# Patient Record
Sex: Male | Born: 1940 | Race: White | Hispanic: No | Marital: Married | Smoking: Never smoker
Health system: Southern US, Community
[De-identification: ages and names within clinical notes are randomized; demographics above are authoritative.]

## PROBLEM LIST (undated history)

## (undated) DIAGNOSIS — I2510 Atherosclerotic heart disease of native coronary artery without angina pectoris: Secondary | ICD-10-CM

## (undated) DIAGNOSIS — I252 Old myocardial infarction: Secondary | ICD-10-CM

## (undated) DIAGNOSIS — I639 Cerebral infarction, unspecified: Secondary | ICD-10-CM

## (undated) DIAGNOSIS — C61 Malignant neoplasm of prostate: Secondary | ICD-10-CM

## (undated) DIAGNOSIS — N400 Enlarged prostate without lower urinary tract symptoms: Secondary | ICD-10-CM

## (undated) DIAGNOSIS — E785 Hyperlipidemia, unspecified: Secondary | ICD-10-CM

## (undated) DIAGNOSIS — Z973 Presence of spectacles and contact lenses: Secondary | ICD-10-CM

## (undated) DIAGNOSIS — R351 Nocturia: Secondary | ICD-10-CM

## (undated) DIAGNOSIS — Z955 Presence of coronary angioplasty implant and graft: Secondary | ICD-10-CM

## (undated) DIAGNOSIS — I255 Ischemic cardiomyopathy: Secondary | ICD-10-CM

## (undated) DIAGNOSIS — R001 Bradycardia, unspecified: Secondary | ICD-10-CM

## (undated) DIAGNOSIS — Q211 Atrial septal defect: Secondary | ICD-10-CM

## (undated) DIAGNOSIS — Q2112 Patent foramen ovale: Secondary | ICD-10-CM

## (undated) HISTORY — DX: Cerebral infarction, unspecified: I63.9

## (undated) HISTORY — DX: Ischemic cardiomyopathy: I25.5

## (undated) HISTORY — PX: PROSTATE BIOPSY: SHX241

## (undated) HISTORY — DX: Atrial septal defect: Q21.1

## (undated) HISTORY — DX: Patent foramen ovale: Q21.12

---

## 1998-11-18 HISTORY — PX: ANKLE CLOSED REDUCTION: SHX880

## 2012-05-30 ENCOUNTER — Encounter (HOSPITAL_COMMUNITY): Payer: Self-pay | Admitting: Emergency Medicine

## 2012-05-30 ENCOUNTER — Inpatient Hospital Stay (HOSPITAL_COMMUNITY)
Admission: EM | Admit: 2012-05-30 | Discharge: 2012-06-01 | DRG: 246 | Disposition: A | Discharge status: Home / Self Care | Payer: PRIVATE HEALTH INSURANCE | Attending: Cardiology | Admitting: Cardiology

## 2012-05-30 ENCOUNTER — Emergency Department (HOSPITAL_COMMUNITY): Payer: PRIVATE HEALTH INSURANCE

## 2012-05-30 DIAGNOSIS — I5043 Acute on chronic combined systolic (congestive) and diastolic (congestive) heart failure: Secondary | ICD-10-CM | POA: Diagnosis present

## 2012-05-30 DIAGNOSIS — Z7902 Long term (current) use of antithrombotics/antiplatelets: Secondary | ICD-10-CM

## 2012-05-30 DIAGNOSIS — I509 Heart failure, unspecified: Secondary | ICD-10-CM | POA: Diagnosis present

## 2012-05-30 DIAGNOSIS — I498 Other specified cardiac arrhythmias: Secondary | ICD-10-CM | POA: Diagnosis present

## 2012-05-30 DIAGNOSIS — Z7982 Long term (current) use of aspirin: Secondary | ICD-10-CM

## 2012-05-30 DIAGNOSIS — I214 Non-ST elevation (NSTEMI) myocardial infarction: Secondary | ICD-10-CM

## 2012-05-30 DIAGNOSIS — E785 Hyperlipidemia, unspecified: Secondary | ICD-10-CM | POA: Diagnosis present

## 2012-05-30 DIAGNOSIS — Z79899 Other long term (current) drug therapy: Secondary | ICD-10-CM

## 2012-05-30 DIAGNOSIS — I2510 Atherosclerotic heart disease of native coronary artery without angina pectoris: Secondary | ICD-10-CM | POA: Diagnosis present

## 2012-05-30 DIAGNOSIS — D72829 Elevated white blood cell count, unspecified: Secondary | ICD-10-CM | POA: Diagnosis present

## 2012-05-30 DIAGNOSIS — Z955 Presence of coronary angioplasty implant and graft: Secondary | ICD-10-CM

## 2012-05-30 HISTORY — DX: Hyperlipidemia, unspecified: E78.5

## 2012-05-30 HISTORY — DX: Atherosclerotic heart disease of native coronary artery without angina pectoris: I25.10

## 2012-05-30 LAB — COMPREHENSIVE METABOLIC PANEL
Albumin: 4 g/dL (ref 3.5–5.2)
BUN: 18 mg/dL (ref 6–23)
Chloride: 107 mEq/L (ref 96–112)
Creatinine, Ser: 1.06 mg/dL (ref 0.50–1.35)
GFR calc non Af Amer: 69 mL/min — ABNORMAL LOW (ref >90)
Total Bilirubin: 0.4 mg/dL (ref 0.3–1.2)

## 2012-05-30 LAB — CBC WITH DIFFERENTIAL/PLATELET
Basophils Relative: 0 % (ref 0–1)
Eosinophils Relative: 1 % (ref 0–5)
HCT: 42.3 % (ref 39.0–52.0)
Hemoglobin: 14.9 g/dL (ref 13.0–17.0)
MCH: 31 pg (ref 26.0–34.0)
MCHC: 35.2 g/dL (ref 30.0–36.0)
MCV: 87.9 fL (ref 78.0–100.0)
Monocytes Absolute: 0.9 10*3/uL (ref 0.1–1.0)
Monocytes Relative: 7 % (ref 3–12)
Neutro Abs: 9.8 10*3/uL — ABNORMAL HIGH (ref 1.7–7.7)

## 2012-05-30 LAB — POCT I-STAT TROPONIN I: Troponin i, poc: 0.27 ng/mL (ref 0.00–0.08)

## 2012-05-30 MED ORDER — NITROGLYCERIN IN D5W 200-5 MCG/ML-% IV SOLN
5.0000 ug/min | Freq: Once | INTRAVENOUS | Status: AC
  Administered 2012-05-30: 10 ug via INTRAVENOUS

## 2012-05-30 MED ORDER — HEPARIN SODIUM (PORCINE) 1000 UNIT/ML IJ SOLN
4000.0000 [IU] | Freq: Once | INTRAMUSCULAR | Status: AC
  Administered 2012-05-30: 4000 [IU] via INTRAVENOUS
  Filled 2012-05-30: qty 4

## 2012-05-30 MED ORDER — HEPARIN (PORCINE) IN NACL 100-0.45 UNIT/ML-% IJ SOLN
1000.0000 [IU]/h | Freq: Once | INTRAMUSCULAR | Status: AC
  Administered 2012-05-30: 1000 [IU]/h via INTRAVENOUS
  Filled 2012-05-30: qty 250

## 2012-05-30 MED ORDER — NITROGLYCERIN IN D5W 200-5 MCG/ML-% IV SOLN
INTRAVENOUS | Status: AC
  Administered 2012-05-30: 10 ug via INTRAVENOUS
  Filled 2012-05-30: qty 250

## 2012-05-30 MED ORDER — ASPIRIN 81 MG PO CHEW
324.0000 mg | CHEWABLE_TABLET | Freq: Once | ORAL | Status: AC
  Administered 2012-05-30: 324 mg via ORAL
  Filled 2012-05-30: qty 4

## 2012-05-30 MED ORDER — NITROGLYCERIN 0.4 MG SL SUBL
0.4000 mg | SUBLINGUAL_TABLET | SUBLINGUAL | Status: AC | PRN
  Administered 2012-05-31 (×3): 0.4 mg via SUBLINGUAL
  Filled 2012-05-30: qty 75

## 2012-05-30 NOTE — ED Provider Notes (Signed)
History     CSN: [id number]  Arrival date & time 05/30/12  2243   First MD Initiated Contact with Patient 05/30/12 2308      Chief Complaint  Patient presents with  . Chest Pain    (Consider location/radiation/quality/duration/timing/severity/associated sxs/prior treatment) HPI Comments: 71 year old male with no medical history presents with a complaint of substernal chest pain which feels like a pressure or heaviness, constant, started 6 hours ago while he was painting his house and is associated with some shortness of breath. He denies nausea vomiting or diaphoresis. He relates a similar event 2 weeks ago which lasted for approximately one hour and released after aspirin and Pepcid. He denies any cardiac history, no family history of heart disease, no tobacco use, no hypertension diabetes or hypercholesterolemia. He denies swelling in his legs, recent travel, trauma, immobilization. He has had aspirin daily but no other medications prior to arrival.  Patient is a 71 y.o. male presenting with chest pain. The history is provided by the patient and the spouse.  Chest Pain     History reviewed. No pertinent past medical history.  History reviewed. No pertinent past surgical history.  History reviewed. No pertinent family history.  History  Substance Use Topics  . Smoking status: Never Smoker   . Smokeless tobacco: Not on file  . Alcohol Use: No      Review of Systems  Cardiovascular: Positive for chest pain.  All other systems reviewed and are negative.    Allergies  Vicodin  Home Medications   Current Outpatient Rx  Name Route Sig Dispense Refill  . ADULT MULTIVITAMIN W/MINERALS CH Oral Take 1 tablet by mouth daily.    . OMEGA-3-ACID ETHYL ESTERS 1 G PO CAPS Oral Take 2 g by mouth 2 (two) times daily.      BP 155/94  Pulse 53  Temp 98.1 F (36.7 C) (Oral)  Resp 18  SpO2 99%  Physical Exam  Nursing note and vitals reviewed. Constitutional: He appears  well-developed and well-nourished. No distress.  HENT:  Head: Normocephalic and atraumatic.  Mouth/Throat: Oropharynx is clear and moist. No oropharyngeal exudate.  Eyes: Conjunctivae and EOM are normal. Pupils are equal, round, and reactive to light. Right eye exhibits no discharge. Left eye exhibits no discharge. No scleral icterus.  Neck: Normal range of motion. Neck supple. No JVD present. No thyromegaly present.  Cardiovascular: Normal rate, regular rhythm, normal heart sounds and intact distal pulses.  Exam reveals no gallop and no friction rub.   No murmur heard. Pulmonary/Chest: Effort normal and breath sounds normal. No respiratory distress. He has no wheezes. He has no rales.  Abdominal: Soft. Bowel sounds are normal. He exhibits no distension and no mass. There is no tenderness.  Musculoskeletal: Normal range of motion. He exhibits no edema and no tenderness.  Lymphadenopathy:    He has no cervical adenopathy.  Neurological: He is alert. Coordination normal.  Skin: Skin is warm and dry. No rash noted. No erythema.  Psychiatric: He has a normal mood and affect. His behavior is normal.    ED Course  Procedures (including critical care time)  Labs Reviewed  CBC WITH DIFFERENTIAL - Abnormal; Notable for the following:    WBC 12.9 (*)     Neutro Abs 9.8 (*)     All other components within normal limits  POCT I-STAT TROPONIN I - Abnormal; Notable for the following:    Troponin i, poc 0.27 (*)     All other components within  normal limits  COMPREHENSIVE METABOLIC PANEL  PROTIME-INR  APTT   No results found.   1. NSTEMI (non-ST elevated myocardial infarction)       MDM  Vital signs show slight hypertension, EKG consistent with ischemia with ST depression in the inferior leads as well as the septal leads in the precordium. Troponin resulted high, cardiology consultation immediately, and route to evaluate at this time. Intravenous heparin bolus and drip, nitroglycerin drip  started, critical care provided for non-ST elevation MI   ED ECG REPORT  I personally interpreted this EKG   Date: 05/30/2012   Rate: 56  Rhythm: sinus bradycardia  QRS Axis: normal  Intervals: normal  ST/T Wave abnormalities: ST depressions inferiorly and ST depressions anteriorly  Conduction Disutrbances:nonspecific intraventricular conduction delay  Narrative Interpretation:   Old EKG Reviewed: none available   CRITICAL CARE Performed by: Vida Roller   Total critical care time: 35  Critical care time was exclusive of separately billable procedures and treating other patients.  Critical care was necessary to treat or prevent imminent or life-threatening deterioration.  Critical care was time spent personally by me on the following activities: development of treatment plan with patient and/or surrogate as well as nursing, discussions with consultants, evaluation of patient's response to treatment, examination of patient, obtaining history from patient or surrogate, ordering and performing treatments and interventions, ordering and review of laboratory studies, ordering and review of radiographic studies, pulse oximetry and re-evaluation of patient's condition.          Vida Roller, MD 05/30/12 [phone]

## 2012-05-30 NOTE — ED Notes (Signed)
Patient states that he was working out in the yard today when he began to have chest pain; denies associated symptoms.  Denies cardiac history.  Describes chest pain as "hurting"; describes location of chest pain as "mid-sternal".

## 2012-05-31 ENCOUNTER — Encounter (HOSPITAL_COMMUNITY): Payer: Self-pay | Admitting: *Deleted

## 2012-05-31 ENCOUNTER — Encounter (HOSPITAL_COMMUNITY)
Admission: EM | Disposition: A | Discharge status: Home / Self Care | Payer: Self-pay | Source: Home / Self Care | Attending: Cardiology

## 2012-05-31 DIAGNOSIS — I214 Non-ST elevation (NSTEMI) myocardial infarction: Secondary | ICD-10-CM

## 2012-05-31 DIAGNOSIS — I2510 Atherosclerotic heart disease of native coronary artery without angina pectoris: Secondary | ICD-10-CM

## 2012-05-31 HISTORY — PX: LEFT HEART CATHETERIZATION WITH CORONARY ANGIOGRAM: SHX5451

## 2012-05-31 LAB — CBC
Hemoglobin: 13.5 g/dL (ref 13.0–17.0)
Hemoglobin: 13.2 g/dL (ref 13.0–17.0)
MCH: 30.5 pg (ref 26.0–34.0)
MCH: 30 pg (ref 26.0–34.0)
MCHC: 33.8 g/dL (ref 30.0–36.0)
MCV: 88.3 fL (ref 78.0–100.0)
MCV: 88.6 fL (ref 78.0–100.0)
Platelets: 189 10*3/uL (ref 150–400)
RBC: 4.43 MIL/uL (ref 4.22–5.81)
RBC: 4.4 MIL/uL (ref 4.22–5.81)
WBC: 15.1 10*3/uL — ABNORMAL HIGH (ref 4.0–10.5)

## 2012-05-31 LAB — CARDIAC PANEL(CRET KIN+CKTOT+MB+TROPI)
CK, MB: 445.4 ng/mL (ref 0.3–4.0)
Relative Index: 15.1 — ABNORMAL HIGH (ref 0.0–2.5)
Total CK: 2830 U/L — ABNORMAL HIGH (ref 7–232)
Troponin I: 1.93 ng/mL (ref <0.30)
Troponin I: 7.93 ng/mL (ref <0.30)
Troponin I: >20 ng/mL (ref <0.30)

## 2012-05-31 LAB — HEMOGLOBIN A1C
Hgb A1c MFr Bld: 5.6 % (ref <5.7)
Mean Plasma Glucose: 114 mg/dL (ref <117)

## 2012-05-31 LAB — PRO B NATRIURETIC PEPTIDE: Pro B Natriuretic peptide (BNP): 2398 pg/mL — ABNORMAL HIGH (ref 0–125)

## 2012-05-31 LAB — LIPID PANEL
Cholesterol: 175 mg/dL (ref 0–200)
Triglycerides: 61 mg/dL (ref <150)

## 2012-05-31 LAB — PROTIME-INR: INR: 0.99 (ref 0.00–1.49)

## 2012-05-31 SURGERY — LEFT HEART CATHETERIZATION WITH CORONARY ANGIOGRAM
Anesthesia: LOCAL

## 2012-05-31 MED ORDER — OMEGA-3-ACID ETHYL ESTERS 1 G PO CAPS
2.0000 g | ORAL_CAPSULE | Freq: Two times a day (BID) | ORAL | Status: DC
  Administered 2012-05-31 – 2012-06-01 (×2): 2 g via ORAL
  Filled 2012-05-31 (×4): qty 2

## 2012-05-31 MED ORDER — LIDOCAINE HCL (PF) 1 % IJ SOLN
INTRAMUSCULAR | Status: AC
  Filled 2012-05-31: qty 30

## 2012-05-31 MED ORDER — MORPHINE SULFATE 2 MG/ML IJ SOLN: 2.0000 mg | Freq: Once | INTRAMUSCULAR | Status: DC

## 2012-05-31 MED ORDER — FENTANYL CITRATE 0.05 MG/ML IJ SOLN
INTRAMUSCULAR | Status: AC
  Filled 2012-05-31: qty 2

## 2012-05-31 MED ORDER — ONDANSETRON HCL 4 MG/2ML IJ SOLN
4.0000 mg | Freq: Four times a day (QID) | INTRAMUSCULAR | Status: DC | PRN
  Administered 2012-05-31: 4 mg via INTRAVENOUS
  Filled 2012-05-31: qty 2

## 2012-05-31 MED ORDER — TICAGRELOR 90 MG PO TABS
ORAL_TABLET | ORAL | Status: AC
  Filled 2012-05-31: qty 2

## 2012-05-31 MED ORDER — SODIUM CHLORIDE 0.9 % IJ SOLN
3.0000 mL | Freq: Two times a day (BID) | INTRAMUSCULAR | Status: DC
  Administered 2012-06-01: 3 mL via INTRAVENOUS

## 2012-05-31 MED ORDER — ASPIRIN EC 81 MG PO TBEC
81.0000 mg | DELAYED_RELEASE_TABLET | Freq: Every day | ORAL | Status: DC
  Administered 2012-06-01: 81 mg via ORAL
  Filled 2012-05-31: qty 1

## 2012-05-31 MED ORDER — MORPHINE SULFATE 2 MG/ML IJ SOLN
INTRAMUSCULAR | Status: AC
  Filled 2012-05-31: qty 1

## 2012-05-31 MED ORDER — BIVALIRUDIN 250 MG IV SOLR
INTRAVENOUS | Status: AC
  Filled 2012-05-31: qty 250

## 2012-05-31 MED ORDER — NITROGLYCERIN IN D5W 200-5 MCG/ML-% IV SOLN
2.0000 ug/min | INTRAVENOUS | Status: DC
  Administered 2012-05-31: 20 ug/min via INTRAVENOUS

## 2012-05-31 MED ORDER — ASPIRIN 81 MG PO CHEW
324.0000 mg | CHEWABLE_TABLET | Freq: Once | ORAL | Status: AC
  Administered 2012-05-31: 324 mg via ORAL
  Filled 2012-05-31: qty 4

## 2012-05-31 MED ORDER — METOPROLOL TARTRATE 12.5 MG HALF TABLET
12.5000 mg | ORAL_TABLET | Freq: Two times a day (BID) | ORAL | Status: DC
  Administered 2012-05-31 – 2012-06-01 (×2): 12.5 mg via ORAL
  Filled 2012-05-31 (×5): qty 1

## 2012-05-31 MED ORDER — HEPARIN (PORCINE) IN NACL 100-0.45 UNIT/ML-% IJ SOLN
1200.0000 [IU]/h | INTRAMUSCULAR | Status: DC
  Administered 2012-05-31: 1200 [IU]/h via INTRAVENOUS
  Filled 2012-05-31: qty 250

## 2012-05-31 MED ORDER — MORPHINE SULFATE 2 MG/ML IJ SOLN
2.0000 mg | Freq: Once | INTRAMUSCULAR | Status: AC
  Administered 2012-05-31: 2 mg via INTRAVENOUS
  Filled 2012-05-31: qty 1

## 2012-05-31 MED ORDER — ADULT MULTIVITAMIN W/MINERALS CH
1.0000 | ORAL_TABLET | Freq: Every day | ORAL | Status: DC
  Administered 2012-06-01: 1 via ORAL
  Filled 2012-05-31 (×2): qty 1

## 2012-05-31 MED ORDER — BIVALIRUDIN 250 MG IV SOLR
1.7500 mg/kg/h | INTRAVENOUS | Status: DC
  Filled 2012-05-31: qty 250

## 2012-05-31 MED ORDER — ENOXAPARIN SODIUM 40 MG/0.4ML ~~LOC~~ SOLN
40.0000 mg | SUBCUTANEOUS | Status: DC
  Administered 2012-06-01: 40 mg via SUBCUTANEOUS
  Filled 2012-05-31: qty 0.4

## 2012-05-31 MED ORDER — SODIUM CHLORIDE 0.9 % IJ SOLN: 3.0000 mL | INTRAMUSCULAR | Status: DC | PRN

## 2012-05-31 MED ORDER — SODIUM CHLORIDE 0.9 % IV SOLN: 250.0000 mL | INTRAVENOUS | Status: DC

## 2012-05-31 MED ORDER — ONDANSETRON HCL 4 MG/2ML IJ SOLN: 4.0000 mg | Freq: Four times a day (QID) | INTRAMUSCULAR | Status: DC | PRN

## 2012-05-31 MED ORDER — NITROGLYCERIN 0.4 MG SL SUBL: 0.4000 mg | SUBLINGUAL_TABLET | SUBLINGUAL | Status: DC | PRN

## 2012-05-31 MED ORDER — MORPHINE SULFATE 0.5 MG/ML IJ SOLN: 2.0000 mg | Freq: Once | INTRAMUSCULAR | Status: DC

## 2012-05-31 MED ORDER — MIDAZOLAM HCL 2 MG/2ML IJ SOLN
INTRAMUSCULAR | Status: AC
  Filled 2012-05-31: qty 2

## 2012-05-31 MED ORDER — ACETAMINOPHEN 325 MG PO TABS
650.0000 mg | ORAL_TABLET | ORAL | Status: DC | PRN
  Administered 2012-05-31: 650 mg via ORAL
  Filled 2012-05-31: qty 2

## 2012-05-31 MED ORDER — NITROGLYCERIN 0.2 MG/ML ON CALL CATH LAB
INTRAVENOUS | Status: AC
  Filled 2012-05-31: qty 1

## 2012-05-31 MED ORDER — TICAGRELOR 90 MG PO TABS
90.0000 mg | ORAL_TABLET | Freq: Two times a day (BID) | ORAL | Status: DC
  Administered 2012-05-31 – 2012-06-01 (×2): 90 mg via ORAL
  Filled 2012-05-31 (×4): qty 1

## 2012-05-31 MED ORDER — MORPHINE SULFATE 4 MG/ML IJ SOLN
4.0000 mg | Freq: Once | INTRAMUSCULAR | Status: AC
  Administered 2012-05-31: 4 mg via INTRAVENOUS
  Filled 2012-05-31: qty 1

## 2012-05-31 MED ORDER — SODIUM CHLORIDE 0.9 % IV SOLN
1.0000 mL/kg/h | INTRAVENOUS | Status: AC
  Administered 2012-05-31: 1 mL/kg/h via INTRAVENOUS

## 2012-05-31 MED ORDER — VERAPAMIL HCL 2.5 MG/ML IV SOLN
INTRAVENOUS | Status: AC
  Filled 2012-05-31: qty 2

## 2012-05-31 MED ORDER — HEPARIN (PORCINE) IN NACL 2-0.9 UNIT/ML-% IJ SOLN
INTRAMUSCULAR | Status: AC
  Filled 2012-05-31: qty 2000

## 2012-05-31 MED ORDER — ATORVASTATIN CALCIUM 40 MG PO TABS
40.0000 mg | ORAL_TABLET | Freq: Every day | ORAL | Status: DC
  Administered 2012-05-31: 40 mg via ORAL
  Filled 2012-05-31 (×3): qty 1

## 2012-05-31 NOTE — H&P (View-Only) (Signed)
Patient ID: Samuel Walls, male   DOB: 07/01/1941, 71 y.o.   MRN: [id number]   SUBJECTIVE: The patient was admitted early this morning with chest pain and a mildly elevated troponin. He received antianginals and morphine in the emergency room. His pain was markedly reduced. This morning he has increasing pain. His CPK is up to 560. MB is 72. Troponin is 2. EKG showed evolving T-wave changes in the anterior legs. I am calling in the cath lab for immediate catheterization.  Filed Vitals:   05/31/12 0001 05/31/12 0025 05/31/12 0211 05/31/12 0443  BP: 148/82 151/90 150/81 129/74  Pulse: 64 72 62 53  Temp:   97.9 F (36.6 C) 97.7 F (36.5 C)  TempSrc:   Oral Oral  Resp: 14 14 18 18  Height:   5' 10" (1.778 m)   Weight:   203 lb 8 oz (92.307 kg)   SpO2: 98% 97% 95% 95%    Intake/Output Summary (Last 24 hours) at 05/31/12 0751 Last data filed at 05/31/12 0700  Gross per 24 hour  Intake 105.55 ml  Output      0 ml  Net 105.55 ml    LABS: Basic Metabolic Panel:  Basename 05/31/12 0336 05/30/12 2305  NA -- 143  K -- 3.9  CL -- 107  CO2 -- 24  GLUCOSE -- 128*  BUN -- 18  CREATININE -- 1.06  CALCIUM -- 9.7  MG 2.2 --  PHOS -- --   Liver Function Tests:  Basename 05/30/12 2305  AST 37  ALT 26  ALKPHOS 126*  BILITOT 0.4  PROT 7.6  ALBUMIN 4.0   No results found for this basename: LIPASE:2,AMYLASE:2 in the last 72 hours CBC:  Basename 05/31/12 0336 05/30/12 2305  WBC 15.1* 12.9*  NEUTROABS -- 9.8*  HGB 13.5 14.9  HCT 39.1 42.3  MCV 88.3 87.9  PLT 179 229   Cardiac Enzymes:  Basename 05/31/12 0336  CKTOTAL 557*  CKMB 72.0*  CKMBINDEX --  TROPONINI 1.93*   BNP: No components found with this basename: POCBNP:3 D-Dimer: No results found for this basename: DDIMER:2 in the last 72 hours Hemoglobin A1C: No results found for this basename: HGBA1C in the last 72 hours Fasting Lipid Panel:  Basename 05/31/12 0450  CHOL 175  HDL 45  LDLCALC 118*  TRIG 61    CHOLHDL 3.9  LDLDIRECT --   Thyroid Function Tests: No results found for this basename: TSH,T4TOTAL,FREET3,T3FREE,THYROIDAB in the last 72 hours  RADIOLOGY: Dg Chest 2 View  05/30/2012  *RADIOLOGY REPORT*  Clinical Data: Chest pain.  CHEST - 2 VIEW  Comparison: None.  Findings: No infiltrate, congestive heart failure or pneumothorax.  Heart size top normal.  Ankylosis thoracic spine.  IMPRESSION: No infiltrate or congestive heart failure.  Original Report Authenticated By: STEVEN R. OLSON, M.D.    PHYSICAL EXAM Patient is oriented to person time and place. Affect is normal. Lungs are clear. Respiratory effort is nonlabored. Cardiac exam reveals S1 and S2. There no clicks or significant murmurs. There is no peripheral edema.  TELEMETRY: I personally reviewed telemetry today May 31, 2012. Her sinus bradycardia.  EKG reveals inverted anterior T waves.   ASSESSMENT AND PLAN:   Non-STEMI (non-ST elevated myocardial infarction)   The patient's pain had decreased in the emergency room but is now increasing. His EKG is showing evolving anterior T-wave changes. His CPK and MB is elevated. I'm giving him a small dose of morphine to help him with his symptoms at this   time as we are arranging for urgent catheterization. I have given him an additional 4 chewable aspirin. He is on heparin.    Tilley Faeth 05/31/2012 7:51 AM  

## 2012-05-31 NOTE — Progress Notes (Addendum)
Patient ID: Samuel Walls, male   DOB: Nov 05, 1941, 71 y.o.   MRN: [id number]   SUBJECTIVE: The patient was admitted early this morning with chest pain and a mildly elevated troponin. He received antianginals and morphine in the emergency room. His pain was markedly reduced. This morning he has increasing pain. His CPK is up to 560. MB is 72. Troponin is 2. EKG showed evolving T-wave changes in the anterior legs. I am calling in the cath lab for immediate catheterization.  Filed Vitals:   05/31/12 0001 05/31/12 0025 05/31/12 0211 05/31/12 0443  BP: 148/82 151/90 150/81 129/74  Pulse: 64 72 62 53  Temp:   97.9 F (36.6 C) 97.7 F (36.5 C)  TempSrc:   Oral Oral  Resp: 14 14 18 18   Height:   5\' 10"  (1.778 m)   Weight:   203 lb 8 oz (92.307 kg)   SpO2: 98% 97% 95% 95%    Intake/Output Summary (Last 24 hours) at 05/31/12 0751 Last data filed at 05/31/12 0700  Gross per 24 hour  Intake 105.55 ml  Output      0 ml  Net 105.55 ml    LABS: Basic Metabolic Panel:  Basename 05/31/12 0336 05/30/12 2305  NA -- 143  K -- 3.9  CL -- 107  CO2 -- 24  GLUCOSE -- 128*  BUN -- 18  CREATININE -- 1.06  CALCIUM -- 9.7  MG 2.2 --  PHOS -- --   Liver Function Tests:  [facility] 05/30/12 2305  AST 37  ALT 26  ALKPHOS 126*  BILITOT 0.4  PROT 7.6  ALBUMIN 4.0   No results found for this basename: LIPASE:2,AMYLASE:2 in the last 72 hours CBC:  Basename 05/31/12 0336 05/30/12 2305  WBC 15.1* 12.9*  NEUTROABS -- 9.8*  HGB 13.5 14.9  HCT 39.1 42.3  MCV 88.3 87.9  PLT 179 229   Cardiac Enzymes:  Basename 05/31/12 0336  CKTOTAL 557*  CKMB 72.0*  CKMBINDEX --  TROPONINI 1.93*   BNP: No components found with this basename: POCBNP:3 D-Dimer: No results found for this basename: DDIMER:2 in the last 72 hours Hemoglobin A1C: No results found for this basename: HGBA1C in the last 72 hours Fasting Lipid Panel:  Basename 05/31/12 0450  CHOL 175  HDL 45  LDLCALC 118*  TRIG 61    CHOLHDL 3.9  LDLDIRECT --   Thyroid Function Tests: No results found for this basename: TSH,T4TOTAL,FREET3,T3FREE,THYROIDAB in the last 72 hours  RADIOLOGY: Dg Chest 2 View  05/30/2012  *RADIOLOGY REPORT*  Clinical Data: Chest pain.  CHEST - 2 VIEW  Comparison: None.  Findings: No infiltrate, congestive heart failure or pneumothorax.  Heart size top normal.  Ankylosis thoracic spine.  IMPRESSION: No infiltrate or congestive heart failure.  Original Report Authenticated By: Fuller Canada, M.D.    PHYSICAL EXAM Patient is oriented to person time and place. Affect is normal. Lungs are clear. Respiratory effort is nonlabored. Cardiac exam reveals S1 and S2. There no clicks or significant murmurs. There is no peripheral edema.  TELEMETRY: I personally reviewed telemetry today May 31, 2012. Her sinus bradycardia.  EKG reveals inverted anterior T waves.   ASSESSMENT AND PLAN:   Non-STEMI (non-ST elevated myocardial infarction)   The patient's pain had decreased in the emergency room but is now increasing. His EKG is showing evolving anterior T-wave changes. His CPK and MB is elevated. I'm giving him a small dose of morphine to help him with his symptoms at this  time as we are arranging for urgent catheterization. I have given him an additional 4 chewable aspirin. He is on heparin.    Samuel Walls 05/31/2012 7:51 AM

## 2012-05-31 NOTE — Interval H&P Note (Signed)
History and Physical Interval Note:  05/31/2012 8:29 AM  Samuel Walls  has presented today for surgery, with the diagnosis of Non-stemi   The various methods of treatment have been discussed with the patient and family. After consideration of risks, benefits and other options for treatment, the patient has consented to  Procedure(s) (LRB): LEFT HEART CATHETERIZATION WITH CORONARY ANGIOGRAM (N/A) as a surgical intervention .  The patient's history has been reviewed, patient examined, no change in status, stable for surgery.  I have reviewed the patients' chart and labs.  Questions were answered to the patient's satisfaction.    Pt with ongoing chest pain unrelieved with medical Rx. Discussed case with Dr Myrtis Ser. Plans for urgent cardiac cath and probable PCI this am. Pt understands risks, benefits, alternatives to this approach and agrees to proceed.  Tonny Bollman  05/31/2012 8:29 AM

## 2012-05-31 NOTE — ED Notes (Signed)
Cardiologist at beside evaluating pt.

## 2012-05-31 NOTE — Progress Notes (Signed)
ANTICOAGULATION CONSULT NOTE - Initial Consult  Pharmacy Consult for  Heparin Indication: chest pain/ACS  Allergies  Allergen Reactions  . Vicodin (Hydrocodone-Acetaminophen)     Patient Measurements: Weight: 203 lb 8 oz (92.307 kg) Heparin Dosing Weight:   Vital Signs: Temp: 97.9 F (36.6 C) (07/14 0211) Temp src: Oral (07/14 0211) BP: 150/81 mmHg (07/14 0211) Pulse Rate: 62  (07/14 0211)  Labs:  Basename 05/30/12 2348 05/30/12 2305  HGB -- 14.9  HCT -- 42.3  PLT -- 229  APTT 32 --  LABPROT 13.3 --  INR 0.99 --  HEPARINUNFRC -- --  CREATININE -- 1.06  CKTOTAL -- --  CKMB -- --  TROPONINI -- --    Medications:  Prescriptions prior to admission  Medication Sig Dispense Refill  . Multiple Vitamin (MULTIVITAMIN WITH MINERALS) TABS Take 1 tablet by mouth daily.      Marland Kitchen omega-3 acid ethyl esters (LOVAZA) 1 G capsule Take 2 g by mouth 2 (two) times daily.        Assessment: 71 yo male admitted with chest pain, found to have NSTEMI.  He was given Aspirin, NTG., and started on IV heparin with a 4000 unit IV bolus and a drip at 1000 units/hr started around midnight.  His H/H is stable at 14.9/42.3 and his platelets are 229K.  He has no known bleeding history and currently without noted bleeding.  Goal of Therapy:  Heparin level 0.3-0.7 units/ml Monitor platelets by anticoagulation protocol: Yes   Plan:   Will continue current heparin rate at 1000 units/hr  F/u level 6 hours after starting  Daily heparin level/cbc   Nadara Mustard, PharmD., MS Clinical Pharmacist Pager:  [phone]  Thank you for allowing pharmacy to be part of this patients care team. 05/31/2012,2:24 AM

## 2012-05-31 NOTE — Progress Notes (Signed)
Critical Lab Value: CK, MB 72.0 and Troponin 1.93.  Called to physician on-call, made aware.  Will continue to monitor.  Arva Chafe

## 2012-05-31 NOTE — CV Procedure (Signed)
Cardiac Catheterization Procedure Note  Name: Samuel Walls MRN: [id number] DOB: [birth year]  Procedure: Left Heart Cath, Selective Coronary Angiography, LV angiography, PTCA and stenting of the proximal LAD, Perclose of the right femoral artery.  Indication: This is a 71 year old gentleman who presented with non-ST elevation MI. Because of ongoing chest pain and evolving ST segment changes on his EKG. He was referred urgently for cardiac catheterization.  Procedural Details:  The right wrist was prepped, draped, and anesthetized with 1% lidocaine. Using the modified Seldinger technique, a 6 French sheath was introduced into the right radial artery. 3 mg of verapamil was administered through the sheath, the patient had been on a heparin drip. Angiomax was started once we obtain access in anticipation of PCI. Standard Judkins catheters were used for selective coronary angiography, but I was unable to cannulate the left coronary artery. The patient had a very difficult angle from the innominate artery into the arch and I was unable to selectively engage either the right or the left coronary artery. Subselective injection of right coronary artery adequately demonstrated patency of that artery. However, I could not get any guide catheter even near the left coronary artery. I tried a JL 3.5 cm guide, an XB LAD 3.5 cm guide, a multipurpose guide, and an AL-1 guide. These were maneuvered with a Pollyann Kennedy wire and a Investment banker, corporate. None of this was successful and eventually access site was changed to the right femoral. The right groin was reprepped, draped, and anesthetized with 1% lidocaine. A 6 French sheath was placed after the artery was accessed with a front wall puncture. An XB LAD 4 cm guide catheter was utilized. See below for description of the PCI procedure. Following PCI, a pigtail catheter was advanced into the left ventricle for left ventriculography. Catheter exchanges were performed over an exchange  length guidewire.  PROCEDURAL FINDINGS Hemodynamics: AO 124/71 LV 123/30   Coronary angiography: Coronary dominance: right  Left mainstem: The left main is patent with 30-40% distal left main stenosis.  Left anterior descending (LAD): The LAD has severe proximal disease. There is an acentric critical plaque of 95% before the first septal perforator. Angiography was performed after a wire was advanced across the lesion because I could not selectively engage the left main prior to that. The wire was used to seat the guide catheter. There was TIMI-2 flow in the LAD and diagonal after the wire was across the lesion. The mid and distal LAD were patent with minor irregularities present. The diagonal branch was a large branch with a 70% ostial stenosis.  Left circumflex (LCx): The left circumflex is a large vessel. The proximal circumflex is widely patent. The mid AV groove circumflex has diffuse irregularity. There are 2 large obtuse marginal branches. The second OM is widely patent. However, the first OM has 50% ostial and 70% proximal stenosis.  Right coronary artery (RCA): The right coronary artery is diffusely diseased. It is a dominant vessel. There is a long segment of 60% proximal to mid stenosis. The distal vessel is patent. The PDA and posterolateral branches are patent. There is a large acute marginal branch with no significant stenosis.  Left ventriculography: There is severe hypokinesis of the antero-apex. The estimated left ventricular ejection fraction is 35%. There is no significant mitral regurgitation.  PCI Note:  Following the diagnostic procedure, the decision was made to proceed with PCI.  The patient was preloaded with brilinta 180 mg on the table. We confirmed that he had no history of  bleeding problems and there were no upcoming surgeries planned. PCI was performed through an XB LAD 4 cm guide catheter. A cougar coronary guidewire was used to cross the lesion.  The lesion was  predilated with a 2.5 x 15 mm balloon.  The proximal LAD before the lesion was very large in caliber. The LAD just beyond the lesion was a moderate-sized vessel. I elected to use a 3 mm stent to try to match this with the distal vessel. The lesion was then stented with a 3.0 x 18 mm Xience drug-eluting stent deployed at 14 atmospheres.  The stent was postdilated with a 3.5 x 12 mm noncompliant balloon to 16 atmospheres.  Following PCI, there was 0% residual stenosis and TIMI-3 flow. There was irregularity proximal to the stent, but this was in the area of the LAD where the vessel was very large and there was no way to achieve stent apposition without an overlap large stent. There appeared to be no flow limitation and no significant associated stenosis. With TIMI-3 flow I was happy with the result. Final angiography confirmed an excellent result. The patient tolerated the procedure well. There were no immediate procedural complications. A TR band was used for radial hemostasis. A Perclose device was used for femoral hemostasis. The patient was transferred to the post catheterization recovery area for further monitoring.  PCI Data: Vessel - LAD/Segment - proximal Percent Stenosis (pre)  95-99 TIMI-flow 2 Stent 3.0 x 18 mm drug-eluting Percent Stenosis (post) 0 TIMI-flow (post) 3  Final Conclusions:   1. 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 Moderately severe segmental left ventricular dysfunction   Recommendations:  Dual antiplatelet therapy with aspirin and brilinta for a minimum of 12 months. Medical therapy for post MI care with associated LV dysfunction. Recommend a 2-D echocardiogram to accurately assess left ventricular ejection fraction.  Tonny Bollman 05/31/2012, 10:20 AM

## 2012-05-31 NOTE — H&P (Signed)
Samuel Walls is an 71 y.o. male.    Chief Complaint: Chest pain  HPI: 71 y/o male with no significant PMH presenting with chest pain and elevated troponins.  He developed a 7/10 chest pain at about 5 p.m.  Chest pain feels like a tightness, non-radiating and not associated with shortness of breath, diaphoresis, nausea or vomiting. In addition, he denies PND, orthopnea, palpitation or syncope.  In ED, his ECG shows sinus bradicardia with ST-depression inferiorly and Q-waves in leads I and aVL (there is no prior ECG for comparison).  His troponin-I (poc) was 0.27.  He received Aspirin 324 and 1 SL NTG and his chest pain had decreased from 7/10 to 2/10 after the first nitroglycerin.  He is currently hemodynamically stable. 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  He has no family history of early CAD, and he denies tobacco abuse.  History reviewed. No pertinent past medical history.  History reviewed. No pertinent past surgical history.  History reviewed. No pertinent family history. Social History:  reports that he has never smoked. He does not have any smokeless tobacco history on file. He reports that he does not drink alcohol or use illicit drugs.  Allergies:  Allergies  Allergen Reactions  . Vicodin (Hydrocodone-Acetaminophen)      (Not in a hospital admission)  Results for orders placed during the hospital encounter of 05/30/12 (from the past 48 hour(s))  CBC WITH DIFFERENTIAL     Status: Abnormal   Collection Time   05/30/12 11:05 PM      Component Value Range Comment   WBC 12.9 (*) 4.0 - 10.5 K/uL    RBC 4.81  4.22 - 5.81 MIL/uL    Hemoglobin 14.9  13.0 - 17.0 g/dL    HCT 16.1  09.6 - 04.5 %    MCV 87.9  78.0 - 100.0 fL    MCH 31.0  26.0 - 34.0 pg    MCHC 35.2  30.0 - 36.0 g/dL    RDW 40.9  81.1 - 91.4 %    Platelets 229  150 - 400 K/uL    Neutrophils Relative 76  43 - 77 %    Neutro Abs 9.8 (*) 1.7 - 7.7 K/uL    Lymphocytes  Relative 16  12 - 46 %    Lymphs Abs 2.1  0.7 - 4.0 K/uL    Monocytes Relative 7  3 - 12 %    Monocytes Absolute 0.9  0.1 - 1.0 K/uL    Eosinophils Relative 1  0 - 5 %    Eosinophils Absolute 0.1  0.0 - 0.7 K/uL    Basophils Relative 0  0 - 1 %    Basophils Absolute 0.0  0.0 - 0.1 K/uL   COMPREHENSIVE METABOLIC PANEL     Status: Abnormal   Collection Time   05/30/12 11:05 PM      Component Value Range Comment   Sodium 143  135 - 145 mEq/L    Potassium 3.9  3.5 - 5.1 mEq/L    Chloride 107  96 - 112 mEq/L    CO2 24  19 - 32 mEq/L    Glucose, Bld 128 (*) 70 - 99 mg/dL    BUN 18  6 - 23 mg/dL    Creatinine, Ser 7.82  0.50 - 1.35 mg/dL    Calcium 9.7  8.4 - 95.6 mg/dL    Total Protein 7.6  6.0 - 8.3 g/dL  Albumin 4.0  3.5 - 5.2 g/dL    AST 37  0 - 37 U/L    ALT 26  0 - 53 U/L    Alkaline Phosphatase 126 (*) 39 - 117 U/L    Total Bilirubin 0.4  0.3 - 1.2 mg/dL    GFR calc non Af Amer 69 (*) >90 mL/min    GFR calc Af Amer 80 (*) >90 mL/min   POCT I-STAT TROPONIN I     Status: Abnormal   Collection Time   05/30/12 11:14 PM      Component Value Range Comment   Troponin i, poc 0.27 (*) 0.00 - 0.08 ng/mL    Comment NOTIFIED PHYSICIAN      Comment 3            PROTIME-INR     Status: Normal   Collection Time   05/30/12 11:48 PM      Component Value Range Comment   Prothrombin Time 13.3  11.6 - 15.2 seconds    INR 0.99  0.00 - 1.49   APTT     Status: Normal   Collection Time   05/30/12 11:48 PM      Component Value Range Comment   aPTT 32  24 - 37 seconds    Dg Chest 2 View  05/30/2012  *RADIOLOGY REPORT*  Clinical Data: Chest pain.  CHEST - 2 VIEW  Comparison: None.  Findings: No infiltrate, congestive heart failure or pneumothorax.  Heart size top normal.  Ankylosis thoracic spine.  IMPRESSION: No infiltrate or congestive heart failure.  Original Report Authenticated By: Fuller Canada, M.D.    Review of Systems  Constitutional: Negative for fever, chills, weight loss,  malaise/fatigue and diaphoresis.  HENT: Negative for hearing loss, ear pain, nosebleeds, congestion, sore throat, neck pain, tinnitus and ear discharge.   Eyes: Negative for blurred vision, double vision, photophobia, pain, discharge and redness.  Respiratory: Negative for cough, hemoptysis, sputum production, shortness of breath, wheezing and stridor.   Cardiovascular: Positive for chest pain. Negative for palpitations, orthopnea, claudication, leg swelling and PND.  Gastrointestinal: Negative for heartburn, nausea, vomiting, abdominal pain, diarrhea, constipation, blood in stool and melena.  Genitourinary: Negative for dysuria, urgency, frequency, hematuria and flank pain.  Musculoskeletal: Negative for myalgias, back pain, joint pain and falls.  Skin: Negative for itching and rash.  Neurological: Negative for dizziness, tingling, tremors, sensory change, speech change, focal weakness, seizures, loss of consciousness, weakness and headaches.  Endo/Heme/Allergies: Does not bruise/bleed easily.  Psychiatric/Behavioral: Negative for hallucinations and substance abuse.    Blood pressure 151/90, pulse 72, temperature 98.1 F (36.7 C), temperature source Oral, resp. rate 14, SpO2 97.00%. Physical Exam  Constitutional: He is oriented to person, place, and time. He appears well-developed and well-nourished. No distress.  HENT:  Head: Normocephalic and atraumatic.  Eyes: EOM are normal. Right eye exhibits no discharge. No scleral icterus.  Neck: No JVD present. No thyromegaly present.  Cardiovascular: Normal rate, regular rhythm and normal heart sounds.  Exam reveals no gallop and no friction rub.   No murmur heard. Respiratory: Effort normal and breath sounds normal. No stridor. No respiratory distress. He has no wheezes. He has no rales. He exhibits no tenderness.  GI: Soft. Bowel sounds are normal. He exhibits no distension. There is no tenderness. There is no rebound and no guarding.    Musculoskeletal: He exhibits no edema and no tenderness.  Neurological: He is alert and oriented to person, place, and time.  Skin: No  rash noted. He is not diaphoretic. No erythema. No pallor.  Psychiatric: He has a normal mood and affect.     Assessment/Plan  NSTEMI:  Patient has received Aspirin and 1 SL NTG with a reduction of his chest pain, and he is currently on heparin drip and is on oxygen by nasal cannula.  If his chest pain is not controlled by 2 more SL NTG, I will start him on Morphine and/or nitro drip until he is chest pain free. If he is not chest pain free with above medication, I will call in the cath lab to undergo a diagnostic/therapeutic cardiac catheterization tonight. If he is chest pain free with above medication, he may undergo cardiac cath on Monday instead.  I will start him on a low-dose beta blocker and initiate him on statin.  [place] Current E 05/31/2012, 12:41 AM

## 2012-05-31 NOTE — Progress Notes (Signed)
ANTICOAGULATION CONSULT NOTE - Follow Up  Pharmacy Consult for  Heparin Indication: chest pain/ACS  Allergies  Allergen Reactions  . Vicodin (Hydrocodone-Acetaminophen)     Patient Measurements: Height: 5\' 10"  (177.8 cm) Weight: 203 lb 8 oz (92.307 kg) IBW/kg (Calculated) : 73  Heparin Dosing Weight: 79 kg  Vital Signs: Temp: 97.7 F (36.5 C) (07/14 0443) Temp src: Oral (07/14 0443) BP: 129/74 mmHg (07/14 0443) Pulse Rate: 53  (07/14 0443)  Labs:  Basename 05/31/12 0336 05/30/12 2348 05/30/12 2305  HGB 13.5 -- 14.9  HCT 39.1 -- 42.3  PLT 179 -- 229  APTT 63* 32 --  LABPROT 15.3* 13.3 --  INR 1.18 0.99 --  HEPARINUNFRC 0.20* -- --  CREATININE -- -- 1.06  CKTOTAL -- -- --  CKMB -- -- --  TROPONINI -- -- --    Medications:  Prescriptions prior to admission  Medication Sig Dispense Refill  . Multiple Vitamin (MULTIVITAMIN WITH MINERALS) TABS Take 1 tablet by mouth daily.      Marland Kitchen omega-3 acid ethyl esters (LOVAZA) 1 G capsule Take 2 g by mouth 2 (two) times daily.        Assessment: 71 yo male admitted with chest pain, found to have NSTEMI.  He was given Aspirin, NTG., and started on IV heparin with a 4000 unit IV bolus and a drip at 1000 units/hr started around midnight.  His H/H is stable at 14.9/42.3 and his platelets are 229K.  He has no known bleeding history and currently without noted bleeding.  Heparin level returns low this AM at 0.2 on IV heparin infusion rate of 1000 Units/hr.  Goal of Therapy:  Heparin level 0.3-0.7 units/ml Monitor platelets by anticoagulation protocol: Yes   Plan:   Will increase heparin rate to 1200 units/hr  F/u level 8 hours after starting  Daily heparin level/cbc   Nadara Mustard, PharmD., MS Clinical Pharmacist Pager:  [phone]  Thank you for allowing pharmacy to be part of this patients care team. 05/31/2012,5:37 AM

## 2012-05-31 NOTE — ED Notes (Signed)
REPORTS GIVEN TO 2000 FLOOR NURSE , NO CHEST PAIN AT TIME OF TRANSPORT , RESPIRATIONS UNLABORED , IV SITE UNREMARKABLE .

## 2012-06-01 ENCOUNTER — Encounter (HOSPITAL_COMMUNITY)
Admission: EM | Disposition: A | Discharge status: Home / Self Care | Payer: Self-pay | Source: Home / Self Care | Attending: Cardiology

## 2012-06-01 ENCOUNTER — Inpatient Hospital Stay (HOSPITAL_COMMUNITY): Payer: PRIVATE HEALTH INSURANCE

## 2012-06-01 ENCOUNTER — Encounter (HOSPITAL_COMMUNITY): Payer: Self-pay | Admitting: Cardiology

## 2012-06-01 ENCOUNTER — Other Ambulatory Visit: Payer: Self-pay | Admitting: *Deleted

## 2012-06-01 DIAGNOSIS — I214 Non-ST elevation (NSTEMI) myocardial infarction: Secondary | ICD-10-CM

## 2012-06-01 DIAGNOSIS — I059 Rheumatic mitral valve disease, unspecified: Secondary | ICD-10-CM

## 2012-06-01 LAB — BASIC METABOLIC PANEL
BUN: 16 mg/dL (ref 6–23)
CO2: 27 mEq/L (ref 19–32)
Chloride: 105 mEq/L (ref 96–112)
Creatinine, Ser: 1.21 mg/dL (ref 0.50–1.35)
Glucose, Bld: 116 mg/dL — ABNORMAL HIGH (ref 70–99)

## 2012-06-01 LAB — POCT ACTIVATED CLOTTING TIME: Activated Clotting Time: 389 seconds

## 2012-06-01 LAB — CARDIAC PANEL(CRET KIN+CKTOT+MB+TROPI)
Relative Index: 5 — ABNORMAL HIGH (ref 0.0–2.5)
Troponin I: >20 ng/mL (ref <0.30)

## 2012-06-01 LAB — URINALYSIS, ROUTINE W REFLEX MICROSCOPIC
Bilirubin Urine: NEGATIVE
Glucose, UA: NEGATIVE mg/dL
Hgb urine dipstick: NEGATIVE
Ketones, ur: NEGATIVE mg/dL
Leukocytes, UA: NEGATIVE
pH: 6 (ref 5.0–8.0)

## 2012-06-01 LAB — CBC
HCT: 38.5 % — ABNORMAL LOW (ref 39.0–52.0)
MCV: 90 fL (ref 78.0–100.0)
RBC: 4.28 MIL/uL (ref 4.22–5.81)
WBC: 14.5 10*3/uL — ABNORMAL HIGH (ref 4.0–10.5)

## 2012-06-01 SURGERY — LEFT HEART CATHETERIZATION WITH CORONARY ANGIOGRAM
Anesthesia: LOCAL

## 2012-06-01 MED ORDER — ASPIRIN 81 MG PO TBEC: 81.0000 mg | DELAYED_RELEASE_TABLET | Freq: Every day | ORAL | Status: AC

## 2012-06-01 MED ORDER — METOPROLOL TARTRATE 12.5 MG HALF TABLET: 12.5000 mg | ORAL_TABLET | Freq: Two times a day (BID) | ORAL | Status: DC

## 2012-06-01 MED ORDER — ATORVASTATIN CALCIUM 40 MG PO TABS: 40.0000 mg | ORAL_TABLET | Freq: Every day | ORAL | Status: DC

## 2012-06-01 MED ORDER — TICAGRELOR 90 MG PO TABS: 90.0000 mg | ORAL_TABLET | Freq: Two times a day (BID) | ORAL | Status: DC

## 2012-06-01 MED ORDER — LISINOPRIL 2.5 MG PO TABS: 2.5000 mg | ORAL_TABLET | Freq: Every day | ORAL | Status: DC

## 2012-06-01 MED ORDER — NITROGLYCERIN 0.4 MG SL SUBL: 0.4000 mg | SUBLINGUAL_TABLET | SUBLINGUAL | Status: DC | PRN

## 2012-06-01 MED FILL — Dextrose Inj 5%: INTRAVENOUS | Qty: 50 | Status: AC

## 2012-06-01 NOTE — Progress Notes (Signed)
  Echocardiogram 2D Echocardiogram has been performed.  Georgian Co 06/01/2012, 12:28 PM

## 2012-06-01 NOTE — Care Management Note (Signed)
    Page 1 of 1   06/01/2012     9:47:55 AM   CARE MANAGEMENT NOTE 06/01/2012  Patient:  Melby,Quamel   Account Number:  [id number]  Date Initiated:  06/01/2012  Documentation initiated by:  Junius Creamer  Subjective/Objective Assessment:   adm w mi     Action/Plan:   lives w wife   Anticipated DC Date:     Anticipated DC Plan:        DC Planning Services  CM consult      Choice offered to / List presented to:             Status of service:   Medicare Important Message given?   (If response is "NO", the following Medicare IM given date fields will be blank) Date Medicare IM given:   Date Additional Medicare IM given:    Discharge Disposition:    Per UR Regulation:  Reviewed for med. necessity/level of care/duration of stay  If discussed at Long Length of Stay Meetings, dates discussed:    Comments:  7/15 9:44a debbie Arnetra Terris rn,bsn [phone] will get cm sec to ck on copay for brilinta.

## 2012-06-01 NOTE — Progress Notes (Signed)
Cardiology Progress Note Patient Name: Samuel Walls Date of Encounter: 06/01/2012, 9:20 AM     Subjective  Patient up walking the halls. Denies chest pain, sob, or dizziness. Wants to go home.    Objective   Telemetry: Sinus bradycardia 40-60s  Medications:    . aspirin EC  81 mg Oral Daily  . atorvastatin  40 mg Oral q1800  . enoxaparin (LOVENOX) injection  40 mg Subcutaneous Q24H  . metoprolol tartrate  12.5 mg Oral BID  .  morphine injection  2 mg Intravenous Once  . morphine      . multivitamin with minerals  1 tablet Oral Daily  . omega-3 acid ethyl esters  2 g Oral BID  . sodium chloride  3 mL Intravenous Q12H  . Ticagrelor      . Ticagrelor  90 mg Oral BID    Physical Exam: Temp:  [97.8 F (36.6 C)-100.6 F (38.1 C)] 98.3 F (36.8 C) (07/15 0800) Pulse Rate:  [52-83] 62  (07/14 1600) Resp:  [12-19] 18  (07/15 0800) BP: (108-129)/(53-73) 110/53 mmHg (07/15 0410) SpO2:  [94 %-97 %] 95 % (07/15 0800)  General: Elderly white male in no acute distress. Head: Normocephalic, atraumatic, sclera non-icteric, nares are without discharge.  Neck: Supple. Negative for carotid bruits or JVD Lungs: Fine rales LLL. Otherwise clear without wheezes or rhonchi. Breathing is unlabored. Heart: RRR S1 S2 without murmurs, rubs, or gallops.  Abdomen: Soft, non-tender, non-distended with normoactive bowel sounds. No rebound/guarding. No obvious abdominal masses. Msk:  Strength and tone appear normal for age. Extremities: Right groin without hematoma. No edema. No clubbing or cyanosis. Distal pedal pulses are intact and equal bilaterally. Neuro: Alert and oriented X 3. Moves all extremities spontaneously. Psych:  Responds to questions appropriately with a normal affect.   Intake/Output Summary (Last 24 hours) at 06/01/12 0920 Last data filed at 06/01/12 0800  Gross per 24 hour  Intake  839.5 ml  Output      0 ml  Net  839.5 ml    Labs:  Basename 06/01/12 0350 05/31/12  1424 05/31/12 0336 05/30/12 2305  NA 140 -- -- 143  K 3.9 -- -- 3.9  CL 105 -- -- 107  CO2 27 -- -- 24  GLUCOSE 116* -- -- 128*  BUN 16 -- -- 18  CREATININE 1.21 0.96 -- --  CALCIUM 9.3 -- -- 9.7  MG -- -- 2.2 --   [facility] 05/30/12 2305  AST 37  ALT 26  ALKPHOS 126*  BILITOT 0.4  PROT 7.6  ALBUMIN 4.0   Basename 06/01/12 0350 05/31/12 1424 05/30/12 2305  WBC 14.5* 13.8* --  NEUTROABS -- -- 9.8*  HGB 13.0 13.2 --  HCT 38.5* 39.0 --  MCV 90.0 88.6 --  PLT 181 189 --   Basename 05/31/12 1424 05/31/12 0800 05/31/12 0336  CKTOTAL 2830* 1211* 557*  CKMB 445.4* 183.1* 72.0*  TROPONINI >20.00* 7.93* 1.93*   Basename 05/31/12 0336  HGBA1C 5.6   Basename 05/31/12 0450  CHOL 175  HDL 45  LDLCALC 118*  TRIG 61  CHOLHDL 3.9     05/31/2012 03:36  Pro B Natriuretic peptide (BNP) 2398.0 (H)   Basename 05/31/12 0336  TSH 3.609     05/31/2012 03:36  Hemoglobin A1C 5.6    Radiology/Studies:   05/30/2012 - Chest 2 View Findings: No infiltrate, congestive heart failure or pneumothorax.  Heart size top normal.  Ankylosis thoracic spine.  IMPRESSION: No infiltrate  or congestive heart failure.    05/31/12 - Cardiac Cath Hemodynamics:  AO 124/71  LV 123/30  Coronary angiography:  Coronary dominance: right  Left mainstem: The left main is patent with 30-40% distal left main stenosis.  Left anterior descending (LAD): The LAD has severe proximal disease. There is an acentric critical plaque of 95% before the first septal perforator. Angiography was performed after a wire was advanced across the lesion because I could not selectively engage the left main prior to that. The wire was used to seat the guide catheter. There was TIMI-2 flow in the LAD and diagonal after the wire was across the lesion. The mid and distal LAD were patent with minor irregularities present. The diagonal branch was a large branch with a 70% ostial stenosis.  Left circumflex (LCx): The left circumflex is a  large vessel. The proximal circumflex is widely patent. The mid AV groove circumflex has diffuse irregularity. There are 2 large obtuse marginal branches. The second OM is widely patent. However, the first OM has 50% ostial and 70% proximal stenosis.  Right coronary artery (RCA): The right coronary artery is diffusely diseased. It is a dominant vessel. There is a long segment of 60% proximal to mid stenosis. The distal vessel is patent. The PDA and posterolateral branches are patent. There is a large acute marginal branch with no significant stenosis.  Left ventriculography: There is severe hypokinesis of the antero-apex. The estimated left ventricular ejection fraction is 35%. There is no significant mitral regurgitation.  Final Conclusions:  1. 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 Moderately severe segmental left ventricular dysfunction  Recommendations:  Dual antiplatelet therapy with aspirin and brilinta for a minimum of 12 months. Medical therapy for post MI care with associated LV dysfunction. Recommend a 2-D echocardiogram to accurately assess left ventricular ejection fraction.    Assessment and Plan  71 y.o. male w/ no prior cardiac history or significant PMHx who presented to [facility] on 05/30/12 with complaints of chest pain.  1. 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 Residual dz RCA and LCx. LV dysfunction w/ EF 35% by cath. Echo pending. No further chest pain. Cont ASA, brilinta, BB, statin.   2. Leukocytosis: WBC 14.5. Low grade fever overnight. Reports a nonproductive cough. Will check UA and CXR.  3. Bradycardia: Has a long standing history of low heart rates according to the patient. Asymptomatic. Cont BB and monitor.  4. Hyperlipidemia: LDL 118. Goal <70. Cont statin. F/u lipids/LFTs 6-8wks.   Disposition: Possibly home today pending  echo, CXR, UA results and evaluation by Dr. Excell Seltzer.  Signed, HOPE, JESSICA PA-C  Patient seen, examined. Available data reviewed. Agree with findings, assessment, and plan as outlined by [facility], PA-C.  2D Echo reviewed with the following findings: Impressions:  - Normal LV size with moderate LV hypertrophy. EF 40% with peri-apical akinesis. Mildly dilated RV with normal systolic function. Mild MR. Mild pulmonary hypertension.  Troponin remains elevated and CKMB trending down as expected. He is clinically stable and I think he is ready for discharge. He needs to be on an ACE-I at discharge with significant LV dysfunction. Start lisinopril 2.5 mg daily. Should follow-up within 2 weeks with PA in the office.  Tonny Bollman, M.D. 06/01/2012 1:30 PM

## 2012-06-01 NOTE — Progress Notes (Signed)
CARDIAC REHAB PHASE I   PRE:  Rate/Rhythm: 53SB  BP:  Supine: 129/66 117/71 Sitting:   Standing:    SaO2:   MODE:  Ambulation: 350 ft   POST:  Rate/Rhythem: 73SR  BP:  Supine: 126/69  Sitting:   Standing:    SaO2:  1045-1145 Pt walked 350 ft with steady gait. Tolerated well. Denied chest pain. Education completed with pt and family. Stressed importance with pt and family not to overdo. Permission given to refer to [place] Phase 2. Echo in after pt walk.  Samuel Walls

## 2012-06-01 NOTE — Progress Notes (Signed)
Brief Nutrition Note  Patient identified on the Nutrition Risk Report for problems swallow, "mostly rice". Pt reports that he does not have other problem foods, and avoids eating rice to prevent problems. Denies any needs for modification of diet at this time.   Body mass index is 29.20 kg/(m^2). Pt meets criteria for overweight based on current BMI.   Current diet order is Heart, patient is consuming approximately 100% of meals at this time. Labs and medications reviewed.   No further nutrition interventions warranted at this time. If additional nutrition issues arise, please re-consult RD.   Clarene Duke RD, LDN Pager [phone] After Hours pager [phone]

## 2012-06-01 NOTE — Telephone Encounter (Addendum)
Trying to verify how patient is taking their Metoprolol and how many mg. Per patient, he does not remember how many mg he received. He just left the hospital.

## 2012-06-01 NOTE — Discharge Summary (Signed)
Discharge Summary   Patient ID: Samuel Walls MRN: [id number], DOB/AGE: Dec 28, 1940 71 y.o.  Primary MD: Dr. Tomasa Blase in [place] Primary Cardiologist: Dr. Myrtis Ser Admit date: 05/30/2012 D/C date:     06/01/2012      Primary Discharge Diagnoses:  1. NSTEMI/CAD (newly diagnosed this admission)  - Cath 05/31/12 critical prox LAD stenosis s/p DES, residual moderate RCA and LCx disease, EF 35%  2. Acute Diastolic and Systolic CHF  - EF 35% by cath, 40% by echo with grade 1 diastolic dysfunction  - Weight 203lbs  3. Hyperlipidemia  - LDL 118, goal <70  - initiated on statin, will need f/u lipids/LFTs 6-8wks  4. Leukocytosis  - In the setting of #1, no signs of infection  Secondary Discharge Diagnoses:  1. Bradycardia, asymptomatic  Allergies Allergies  Allergen Reactions  . Vicodin (Hydrocodone-Acetaminophen)     Diagnostic Studies/Procedures:   05/31/12 - Cardiac Cath  Hemodynamics:  AO 124/71  LV 123/30  Coronary angiography:  Coronary dominance: right  Left mainstem: The left main is patent with 30-40% distal left main stenosis.  Left anterior descending (LAD): The LAD has severe proximal disease. There is an acentric critical plaque of 95% before the first septal perforator. Angiography was performed after a wire was advanced across the lesion because I could not selectively engage the left main prior to that. The wire was used to seat the guide catheter. There was TIMI-2 flow in the LAD and diagonal after the wire was across the lesion. The mid and distal LAD were patent with minor irregularities present. The diagonal branch was a large branch with a 70% ostial stenosis.  Left circumflex (LCx): The left circumflex is a large vessel. The proximal circumflex is widely patent. The mid AV groove circumflex has diffuse irregularity. There are 2 large obtuse marginal branches. The second OM is widely patent. However, the first OM has 50% ostial and 70% proximal stenosis.  Right  coronary artery (RCA): The right coronary artery is diffusely diseased. It is a dominant vessel. There is a long segment of 60% proximal to mid stenosis. The distal vessel is patent. The PDA and posterolateral branches are patent. There is a large acute marginal branch with no significant stenosis.  Left ventriculography: There is severe hypokinesis of the antero-apex. The estimated left ventricular ejection fraction is 35%. There is no significant mitral regurgitation.  Final Conclusions:  1. 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 Moderately severe segmental left ventricular dysfunction  Recommendations:  Dual antiplatelet therapy with aspirin and brilinta for a minimum of 12 months. Medical therapy for post MI care with associated LV dysfunction. Recommend a 2-D echocardiogram to accurately assess left ventricular ejection fraction  06/01/12 - 2D Echocardiogram Study Conclusions: - Left ventricle: The cavity size was normal. Wall thickness was increased in a pattern of moderate LVH. The estimated ejection fraction was 40%. Apical septal, apical lateral, apical inferior and apical anterior akinesis. Akinesis of the true apex. Other segments contract well. Doppler parameters are consistent with abnormal left ventricular relaxation (grade 1 diastolic dysfunction).  - Aortic valve: There was no stenosis. Trivial regurgitation. - Mitral valve: Mildly calcified annulus. Mild regurgitation. - Left atrium: The atrium was mildly to moderately dilated. - Right ventricle: The cavity size was mildly dilated. Systolic function was normal. - Right atrium: The atrium was mildly dilated. - Tricuspid valve: Peak RV-RA gradient: 29mm  Hg (S). - Pulmonary arteries: PA peak pressure: 44mm Hg (S). - Systemic veins: IVC measured 2.6 cm with some respirophasic variation, suggesting RA  pressure 15 mmHg.  Impressions: Normal LV size with moderate LV hypertrophy. EF 40% with peri-apical akinesis. Mildly dilated RV with normal systolic function. Mild MR. Mild pulmonary hypertension.   History of Present Illness: 71 y.o. male w/ no prior cardiac history or significant PMHx who presented to [facility] on [facility]/30/12 with complaints of chest pain and ruled in for NSTEMI.  Hospital Course: EKG revealed sinus bradycardia w/ inferior ST depression and Q waves I, aVL. CXR was without acute cardiopulmonary abnormalities. Labs were significant for poc troponin 0.27, WBC 12.9, Crt 1.06. He had some relief of his chest pain with SL NTG. He was placed on ASA, BB, statin, IV heparin and admitted for further evaluation and treatment.   He had recurrent chest pain with elevation of cardiac enzymes and evolving anterior TW changes on EKG. Cardiac cath was performed on 05/31/12 revealing critical stenosis of prox LAD, moderate RCA and LCx disease, EF 35%. Successful placement of DES to LAD was performed. He tolerated the procedure well without complications. Recommendations were made for at least one year of DAPT and post MI medical therapy. Echocardiogram revealed EF 40% with peri-apical akinesis, mild MR, and mild pulm htn. A low dose ACEI was initiated.   He was able to ambulate without chest pain or sob. Right groin site remained stable without bleeding or hematoma. He developed a low grade fever (100.6) and leukocytosis which was evaluated with CXR and UA, both unremarkable. No signs of infection were found and it was felt related to the acute illness. He was seen and evaluated by Dr. Excell Seltzer who felt he was stable for discharge home with plans for follow up as scheduled below.  Discharge Vitals: Blood pressure 126/69, pulse 62, temperature 98 F (36.7 C), temperature source Oral, resp. rate 18, height 5\' 10"  (1.778 m), weight 203 lb 8 oz (92.307 kg), SpO2 94.00%.  Labs: Lab Results    Component Value Date   WBC 14.5* 06/01/2012   HGB 13.0 06/01/2012   HCT 38.5* 06/01/2012   MCV 90.0 06/01/2012   PLT 181 06/01/2012     Lab 06/01/12 0350 05/30/12 2305  NA 140 --  K 3.9 --  CL 105 --  CO2 27 --  BUN 16 --  CREATININE 1.21 --  CALCIUM 9.3 --  PROT -- 7.6  BILITOT -- 0.4  ALKPHOS -- 126*  ALT -- 26  AST -- 37  GLUCOSE 116* --    Basename 06/01/12 1141 05/31/12 1424 05/31/12 0800 05/31/12 0336  CKTOTAL 1351* 2830* 1211* 557*  CKMB 67.9* 445.4* 183.1* 72.0*  TROPONINI >20.00* >20.00* 7.93* 1.93*   Lab Results  Component Value Date   CHOL 175 05/31/2012   HDL 45 05/31/2012   LDLCALC 118* 05/31/2012   TRIG 61 05/31/2012     05/31/2012 03:36  Hemoglobin A1C 5.6     05/31/2012 03:36  TSH 3.609     05/31/2012 03:36  Pro B Natriuretic peptide (BNP) 2398.0 (H)    Discharge Medications   Medication List  As of 06/01/2012  2:09 PM   TAKE these medications         aspirin 81 MG EC tablet   Take 1 tablet (81 mg total) by mouth daily.      atorvastatin 40 MG tablet   Commonly known as: LIPITOR   Take 1 tablet (40  mg total) by mouth at bedtime.      lisinopril 2.5 MG tablet   Commonly known as: PRINIVIL,ZESTRIL   Take 1 tablet (2.5 mg total) by mouth daily.      metoprolol tartrate 12.5 mg Tabs   Commonly known as: LOPRESSOR   Take 0.5 tablets (12.5 mg total) by mouth 2 (two) times daily.      multivitamin with minerals Tabs   Take 1 tablet by mouth daily.      nitroGLYCERIN 0.4 MG SL tablet   Commonly known as: NITROSTAT   Place 1 tablet (0.4 mg total) under the tongue every 5 (five) minutes as needed for chest pain (up to 3 doses).      omega-3 acid ethyl esters 1 G capsule   Commonly known as: LOVAZA   Take 2 g by mouth 2 (two) times daily.      Ticagrelor 90 MG Tabs tablet   Commonly known as: BRILINTA   Take 1 tablet (90 mg total) by mouth 2 (two) times daily.            Disposition   Discharge Orders    Future Appointments: Provider:  Department: Dept Phone: Center:   06/11/2012 11:10 AM Beatrice Lecher, PA [facility] [phone] [facility]     Future Orders Please Complete By Expires   Amb Referral to Cardiac Rehabilitation      Comments:   Referring to [place] Phase 2   Diet - low sodium heart healthy      Increase activity slowly      Discharge instructions      Comments:   **PLEASE REMEMBER TO BRING ALL OF YOUR MEDICATIONS TO EACH OF YOUR FOLLOW-UP OFFICE VISITS.  * KEEP GROIN SITE CLEAN AND DRY. Call the office for any signs of bleedings, pus, swelling, increased pain, or any other concerns. * NO HEAVY LIFTING (>10lbs) X 2 WEEKS. * NO SEXUAL ACTIVITY X 2 WEEKS. * NO DRIVING X 1 WEEK. * NO SOAKING BATHS, HOT TUBS, POOLS, ETC., X 7 DAYS.  * You were started on a cholesterol medication (Lipitor) this hospitalization and will need follow up blood work (lipid panel and liver function tests) in 6-8wks with your primary care provider.     Follow-up Information    Follow up with Tereso Newcomer, PA on 06/11/2012. (11:10)    Contact information:   [employer] [address] [place] [address] [phone]           Outstanding Labs/Studies:  1. Consider follow up BMET with initiation of ACEI 2. Patient will require follow up lipid panel and liver function tests in 6-8 weeks as we initiated a new statin during this hospitalization.    Duration of Discharge Encounter: Greater than 30 minutes including physician and PA time.  Signed, Teddy Pena PA-C 06/01/2012, 2:09 PM

## 2012-06-04 ENCOUNTER — Other Ambulatory Visit: Payer: Self-pay | Admitting: *Deleted

## 2012-06-04 MED ORDER — METOPROLOL TARTRATE 12.5 MG HALF TABLET: ORAL_TABLET | ORAL | Status: DC

## 2012-06-04 NOTE — Telephone Encounter (Signed)
Refilled metoprolol 

## 2012-06-05 ENCOUNTER — Other Ambulatory Visit: Payer: Self-pay | Admitting: *Deleted

## 2012-06-05 MED ORDER — METOPROLOL TARTRATE 25 MG PO TABS: ORAL_TABLET | ORAL | Status: DC

## 2012-06-06 ENCOUNTER — Telehealth: Payer: Self-pay | Admitting: Physician Assistant

## 2012-06-06 NOTE — Telephone Encounter (Signed)
Pt called because he had been coughing since being in the hospital. The cough is non-productive and he has no fevers/chills or other URI symptoms. He is otherwise doing well. The cough is becoming very annoying.   Advised him to hold the lisinopril but continue other meds. Advised him cough could be SE of this med and will resolve if he stops it. OK to take Robitussin DM PRN but avoid multi-symptom meds. Call if further problems, otherwise, keep appt on Thurs.

## 2012-06-09 NOTE — Discharge Summary (Signed)
Agree as outlined.  Tonny Bollman 06/09/2012 11:32 PM

## 2012-06-11 ENCOUNTER — Encounter: Payer: Self-pay | Admitting: Physician Assistant

## 2012-06-11 ENCOUNTER — Ambulatory Visit (INDEPENDENT_AMBULATORY_CARE_PROVIDER_SITE_OTHER): Payer: Medicare Other | Admitting: Physician Assistant

## 2012-06-11 VITALS — BP 122/62 | HR 49 | Ht 71.0 in | Wt 199.1 lb

## 2012-06-11 DIAGNOSIS — R059 Cough, unspecified: Secondary | ICD-10-CM

## 2012-06-11 DIAGNOSIS — R05 Cough: Secondary | ICD-10-CM

## 2012-06-11 DIAGNOSIS — I214 Non-ST elevation (NSTEMI) myocardial infarction: Secondary | ICD-10-CM

## 2012-06-11 DIAGNOSIS — I255 Ischemic cardiomyopathy: Secondary | ICD-10-CM

## 2012-06-11 DIAGNOSIS — I2589 Other forms of chronic ischemic heart disease: Secondary | ICD-10-CM

## 2012-06-11 DIAGNOSIS — E785 Hyperlipidemia, unspecified: Secondary | ICD-10-CM

## 2012-06-11 DIAGNOSIS — I2510 Atherosclerotic heart disease of native coronary artery without angina pectoris: Secondary | ICD-10-CM

## 2012-06-11 MED ORDER — LOSARTAN POTASSIUM 25 MG PO TABS: 25.0000 mg | ORAL_TABLET | Freq: Every day | ORAL | Status: DC

## 2012-06-11 NOTE — Patient Instructions (Addendum)
Your physician has recommended you make the following change in your medication: START LOSARTAN 25 MG DAILY, PLEASE CALL ME TO LET ME KNOW IF YOU DO NOT PICK UP THE PRESCRIPTION [phone] Danielle Rankin, CMA  Your physician recommends that you return for lab work in: 06/18/12 BMET DUE TO STARTING LOSARTAN  Your physician recommends that you schedule a follow-up appointment in: APPROX 8 WEEKS WITH DR. KATZ YOU WILL NEED FASTING LIPID AND LIVER PANEL BEFORE SEEING DR. KATZ   NO OTHER CHANGES WERE MADE TODAY  PT STATES HE WILL GET BMET AND FLP/LFT DONE IN [place], RX'S WERE GIVEN TO PT TODAY TO GET THESE DONE

## 2012-06-11 NOTE — Progress Notes (Signed)
[address] [place], [place]  [address] Phone: [phone] Fax:  [phone]  Date:  06/11/2012   Name:  Samuel Walls   DOB:  November 01, 1941   MRN:  [id number]  PCP:  No primary provider on file.  Primary Cardiologist:  Dr. Zackery Barefoot  Primary Electrophysiologist:  None    History of Present Illness: Samuel Walls is a 71 y.o. male who returns for post hospital follow up.  He has no prior history of CAD.  He was recently admitted 7/13-7/15 with a NSTEMI.  LHC 05/31/12: dLM 30-40%, pLAD 95%, oDx 70%, OM1 ostial 50% and prox 70%, prox to mid RCA 60%, antero-apical HK, EF 35%.  PCI:  Xience DES to the proximal LAD.  2-D echo 06/01/12: Moderate LVH, EF 40%, apical septal, apical lateral, apical inferior and apical anterior AK, AK of the true apex, grade 1 diastolic dysfunction, trivial AI, mild MAC, mild MR, mild to moderate LAE, mild RVE, normal RVF, mild RAE, PASP 44.  Dual and a platelet therapy recommended for one year.  He was started on high-dose statin, ACE inhibitor and beta blocker.  Of note, he did call in with cough and was told to stop his ACE inhibitor until follow up today.  Overall, he is doing well.  He did see his PCP and was diagnosed with bronchitis.  He is now on Levaquin.  He denies chest pain, shortness of breath, syncope.  He denies orthopnea, PND or edema.   Potassium  Date/Time Value Range Status  06/01/2012  3:50 AM 3.9  3.5 - 5.1 mEq/L Final     Creatinine, Ser  Date/Time Value Range Status  06/01/2012  3:50 AM 1.21  0.50 - 1.35 mg/dL Final    Past Medical History  Diagnosis Date  . CAD (coronary artery disease)     s/p NSTEMI - LHC 05/31/12: dLM 30-40%, pLAD 95%, oDx 70%, OM1 ostial 50% and prox 70%, prox to mid RCA 60%, antero-apical HK, EF 35%.  PCI:  Xience DES to the proximal LAD.  Marland Kitchen Hyperlipidemia LDL goal < 70   . Bradycardia     asymptomatic  . Ischemic cardiomyopathy     EF 35% by cath 05/31/12; 2-D echo 06/01/12: Moderate  LVH, EF 40%, apical septal, apical lateral, apical inferior and apical anterior AK, AK of the true apex, grade 1 diastolic dysfunction, trivial AI, mild MAC, mild MR, mild to moderate LAE, mild RVE, normal RVF, mild RAE, PASP 44    Current Outpatient Prescriptions  Medication Sig Dispense Refill  . aspirin EC 81 MG EC tablet Take 1 tablet (81 mg total) by mouth daily.      Marland Kitchen atorvastatin (LIPITOR) 40 MG tablet Take 1 tablet (40 mg total) by mouth at bedtime.  30 tablet  2  . lisinopril (ZESTRIL) 2.5 MG tablet Take 1 tablet (2.5 mg total) by mouth daily.  30 tablet  6  . metoprolol tartrate (LOPRESSOR) 25 MG tablet Take one half tablet twice a day  60 tablet  9  . Multiple Vitamin (MULTIVITAMIN WITH MINERALS) TABS Take 1 tablet by mouth daily.      . nitroGLYCERIN (NITROSTAT) 0.4 MG SL tablet Place 1 tablet (0.4 mg total) under the tongue every 5 (five) minutes as needed for chest pain (up to 3 doses).  25 tablet  3  . omega-3 acid ethyl esters (LOVAZA) 1 G capsule Take 2 g by mouth 2 (two) times daily.      . Ticagrelor (  BRILINTA) 90 MG TABS tablet Take 1 tablet (90 mg total) by mouth 2 (two) times daily.  60 tablet  6    Allergies: Allergies  Allergen Reactions  . Vicodin (Hydrocodone-Acetaminophen)     History  Substance Use Topics  . Smoking status: Never Smoker   . Smokeless tobacco: Not on file  . Alcohol Use: No     ROS:  Please see the history of present illness.    All other systems reviewed and negative.   PHYSICAL EXAM: VS:  BP 122/62  Pulse 49  Ht 5\' 11"  (1.803 m)  Wt 199 lb 1.9 oz (90.32 kg)  BMI 27.77 kg/m2 Well nourished, well developed, in no acute distress HEENT: normal Neck: no JVD Cardiac:  normal S1, S2; RRR; no murmur Lungs:  clear to auscultation bilaterally, no wheezing, rhonchi or rales Abd: soft, nontender, no hepatomegaly Ext: no edema; right groin without hematoma or bruit; right wrist without hematoma or bruit  Skin: warm and dry Neuro:  CNs 2-12  intact, no focal abnormalities noted  EKG:  Sinus bradycardia, heart rate 49, normal axis, T wave inversions 1, aVL, V2-V5, anterolateral Q waves      ASSESSMENT AND PLAN:  1.  CAD Doing well post PCI in the setting of NSTEMI. We discussed the importance of dual antiplatelet therapy.  He is unable to attend cardiac rehabilitation.  He will continue to increase his activity on his own. Follow up with Dr. Myrtis Ser in 8 weeks.  2.  Ischemic cardiomyopathy Continue beta blocker.  Given his cough, I will try to change him to an ARB.  Stop lisinopril.  Start losartan 25 mg daily.  Check a basic metabolic panel in one week.  Follow up with Dr. Myrtis Ser in a weeks.  He will likely need a repeat echocardiogram 3 months out from his PCI.  3.  Cough Now on antibiotics from his PCP. Change ACE to ARB.  4.  Hyperlipidemia Check Lipids and LFTs in 6-8 weeks.  5.  Bradycardia Asymptomatic.  Signed, Tereso Newcomer, PA-C  10:49 AM 06/11/2012

## 2012-06-12 ENCOUNTER — Telehealth: Payer: Self-pay | Admitting: Cardiology

## 2012-06-12 NOTE — Telephone Encounter (Signed)
Pt notified and agrees. 

## 2012-06-12 NOTE — Telephone Encounter (Signed)
Pt states he took Losartan for the first time this am and noticed some sob shortly after taking the Losartan. The sob has lasted all day and is off and on.  The sob is with and without exertion.  He thinks it is from the losartan.  No other symptoms.  Please advise.

## 2012-06-12 NOTE — Telephone Encounter (Signed)
New problem:  Patient was seen on yesterday. Started on new  medication losartan 25 mg. C/O sob.

## 2012-06-12 NOTE — Telephone Encounter (Signed)
Hold Losartan.  Wait 2 weeks. Then try to restart. Cancel BMET next week. He needs a BMET scheduled one week after restarting. Tereso Newcomer, PA-C  2:50 PM 06/12/2012

## 2012-06-29 ENCOUNTER — Telehealth: Payer: Self-pay | Admitting: Physician Assistant

## 2012-06-29 NOTE — Telephone Encounter (Signed)
New msg Pt wants to know lab results

## 2012-06-29 NOTE — Telephone Encounter (Signed)
Patient called stated he had lab work ordered by Tereso Newcomer PA done at Dr.Douglas Shultz's office in [place] this past Friday 06/26/12.Patient wanting to know results.Dr.Shultz's office called will fax results.

## 2012-06-30 NOTE — Telephone Encounter (Signed)
LMOM that I did rcv lab results today from Dr. Lazarus Gowda office and that I saw he had been notified of lab results from PCP earlier today and that I will show the results to [employer]. PAC and if there are any changes I will call back then, or he can cb

## 2012-07-02 ENCOUNTER — Telehealth: Payer: Self-pay | Admitting: Physician Assistant

## 2012-07-02 NOTE — Telephone Encounter (Signed)
pt returning my call from 8/12 about lab results that we did not yet have them from PCP. I told pt that I had to request the results from PCP and that it has a note on it that pt was notified of results on 8/13 @ 11:10, [place] know who his PCP s/w but [place]. I did advise that his labs were ok and there were no changes being made. Pt said thank you.

## 2012-07-02 NOTE — Telephone Encounter (Signed)
Fu call °Pt returning your call  °

## 2012-07-02 NOTE — Telephone Encounter (Signed)
pt returning my call from 8/12 about lab results that we did not yet have them from PCP. I told pt that I had to request the results from PCP and that it has a note on it that pt was notified of results on 8/13 @ 11:10, pt said [place] he states he does not know who his PCP s/w but [place] has [place] the beach all this time. I did advise that his labs were ok and there were no changes being made. Pt said thank you. 

## 2012-07-23 ENCOUNTER — Encounter: Payer: Self-pay | Admitting: Physician Assistant

## 2012-07-28 DIAGNOSIS — I2510 Atherosclerotic heart disease of native coronary artery without angina pectoris: Secondary | ICD-10-CM | POA: Insufficient documentation

## 2012-07-28 DIAGNOSIS — E785 Hyperlipidemia, unspecified: Secondary | ICD-10-CM | POA: Insufficient documentation

## 2012-07-28 DIAGNOSIS — R001 Bradycardia, unspecified: Secondary | ICD-10-CM | POA: Insufficient documentation

## 2012-07-28 DIAGNOSIS — I255 Ischemic cardiomyopathy: Secondary | ICD-10-CM | POA: Insufficient documentation

## 2012-07-30 ENCOUNTER — Ambulatory Visit (INDEPENDENT_AMBULATORY_CARE_PROVIDER_SITE_OTHER): Payer: PRIVATE HEALTH INSURANCE | Admitting: Cardiology

## 2012-07-30 ENCOUNTER — Encounter: Payer: Self-pay | Admitting: *Deleted

## 2012-07-30 VITALS — BP 132/76 | HR 50 | Ht 71.0 in | Wt 194.0 lb

## 2012-07-30 DIAGNOSIS — I214 Non-ST elevation (NSTEMI) myocardial infarction: Secondary | ICD-10-CM

## 2012-07-30 DIAGNOSIS — I2510 Atherosclerotic heart disease of native coronary artery without angina pectoris: Secondary | ICD-10-CM

## 2012-07-30 DIAGNOSIS — I255 Ischemic cardiomyopathy: Secondary | ICD-10-CM

## 2012-07-30 DIAGNOSIS — R05 Cough: Secondary | ICD-10-CM

## 2012-07-30 DIAGNOSIS — I2589 Other forms of chronic ischemic heart disease: Secondary | ICD-10-CM

## 2012-07-30 DIAGNOSIS — R059 Cough, unspecified: Secondary | ICD-10-CM

## 2012-07-30 MED ORDER — LOSARTAN POTASSIUM 50 MG PO TABS: 50.0000 mg | ORAL_TABLET | Freq: Every day | ORAL | Status: DC

## 2012-07-30 NOTE — Assessment & Plan Note (Signed)
Coronary disease is stable postintervention. He is continuing on his medications

## 2012-07-30 NOTE — Patient Instructions (Addendum)
Your physician has recommended you make the following change in your medication: Increase your Cozaar to 50mg  daily  Your physician recommends that you schedule a follow-up appointment in: 6-7 weeks  Your physician has requested that you have an echocardiogram. Echocardiography is a painless test that uses sound waves to create images of your heart. It provides your doctor with information about the size and shape of your heart and how well your heart's chambers and valves are working. This procedure takes approximately one hour. There are no restrictions for this procedure.

## 2012-07-30 NOTE — Progress Notes (Signed)
HPI The patient is seen today to followup coronary disease. He was hospitalized with an MI in July, 2013. He saw [name] in the office for his post hospital visit very at he has done well. He is not had any significant chest pain. He is taking his medications appropriately. He did have left ventricular dysfunction at the time of this cath. Also he had a cough when he was seen with his last visit. His ACE inhibitor was changed to an ARB and the cough is better.  Allergies  Allergen Reactions  . Vicodin (Hydrocodone-Acetaminophen)     Current Outpatient Prescriptions  Medication Sig Dispense Refill  . aspirin EC 81 MG EC tablet Take 1 tablet (81 mg total) by mouth daily.      Marland Kitchen atorvastatin (LIPITOR) 40 MG tablet Take 1 tablet (40 mg total) by mouth at bedtime.  30 tablet  2  . losartan (COZAAR) 50 MG tablet Take 1 tablet (50 mg total) by mouth daily.  30 tablet  11  . metoprolol succinate (TOPROL-XL) 12.5 mg TB24 Take 6.25 mg by mouth 2 (two) times daily.      . Multiple Vitamin (MULTIVITAMIN WITH MINERALS) TABS Take 1 tablet by mouth daily.      . nitroGLYCERIN (NITROSTAT) 0.4 MG SL tablet Place 1 tablet (0.4 mg total) under the tongue every 5 (five) minutes as needed for chest pain (up to 3 doses).  25 tablet  3  . omega-3 acid ethyl esters (LOVAZA) 1 G capsule Take 2 g by mouth 2 (two) times daily.      . Ticagrelor (BRILINTA) 90 MG TABS tablet Take 1 tablet (90 mg total) by mouth 2 (two) times daily.  60 tablet  6  . DISCONTD: losartan (COZAAR) 25 MG tablet Take 1 tablet (25 mg total) by mouth daily.  30 tablet  11    History   Social History  . Marital Status: Married    Spouse Name: N/A    Number of Children: N/A  . Years of Education: N/A   Occupational History  . Not on file.   Social History Main Topics  . Smoking status: Never Smoker   . Smokeless tobacco: Not on file  . Alcohol Use: No  . Drug Use: No  . Sexually Active:    Other Topics Concern  . Not on file     Social History Narrative  . No narrative on file    No family history on file.  Past Medical History  Diagnosis Date  . CAD (coronary artery disease)     s/p NSTEMI - LHC 05/31/12: dLM 30-40%, pLAD 95%, oDx 70%, OM1 ostial 50% and prox 70%, prox to mid RCA 60%, antero-apical HK, EF 35%.  PCI:  Xience DES to the proximal LAD.  Marland Kitchen Hyperlipidemia LDL goal < 70   . Bradycardia     asymptomatic  . Ischemic cardiomyopathy     EF 35% by cath 05/31/12; 2-D echo 06/01/12: Moderate LVH, EF 40%, apical septal, apical lateral, apical inferior and apical anterior AK, AK of the true apex, grade 1 diastolic dysfunction, trivial AI, mild MAC, mild MR, mild to moderate LAE, mild RVE, normal RVF, mild RAE, PASP 44  . Hyperlipidemia 06/11/2012  . NSTEMI (non-ST elevated myocardial infarction)     Past Surgical History  Procedure Date  . Ankle closed reduction   . Cardiac catheterization 05/30/12    ROS   Patient denies fever, chills, headache, sweats, rash, change in vision, change  in hearing, chest pain, cough, nausea vomiting, urinary symptoms. All other systems are reviewed and are negative.  PHYSICAL EXAM  Patient is oriented to person time and place. Affect is normal. He is here with his wife. There is no jugulovenous distention. Lungs are clear. Respiratory effort is nonlabored. Cardiac exam reveals S1 and S2. There no clicks or significant murmurs. The abdomen is soft. Is no peripheral edema.  Filed Vitals:   07/30/12 1422  BP: 132/76  Pulse: 50  Height: 5\' 11"  (1.803 m)  Weight: 194 lb (87.998 kg)     ASSESSMENT & PLAN

## 2012-07-30 NOTE — Assessment & Plan Note (Signed)
We will be doing an echo in 3 months after his event.  I am hoping for good return of LV function. In the meantime he is on appropriate medications. I will see him back in the office several weeks after his follow up echo.

## 2012-07-30 NOTE — Assessment & Plan Note (Signed)
The patient's cough is definitely improved with switching from an ACE inhibitor to an ARB

## 2012-08-06 ENCOUNTER — Ambulatory Visit (HOSPITAL_COMMUNITY): Payer: PRIVATE HEALTH INSURANCE | Attending: Cardiology | Admitting: Radiology

## 2012-08-06 DIAGNOSIS — I2510 Atherosclerotic heart disease of native coronary artery without angina pectoris: Secondary | ICD-10-CM | POA: Insufficient documentation

## 2012-08-06 DIAGNOSIS — I379 Nonrheumatic pulmonary valve disorder, unspecified: Secondary | ICD-10-CM | POA: Insufficient documentation

## 2012-08-06 DIAGNOSIS — I369 Nonrheumatic tricuspid valve disorder, unspecified: Secondary | ICD-10-CM | POA: Insufficient documentation

## 2012-08-06 DIAGNOSIS — I2589 Other forms of chronic ischemic heart disease: Secondary | ICD-10-CM

## 2012-08-06 DIAGNOSIS — I059 Rheumatic mitral valve disease, unspecified: Secondary | ICD-10-CM | POA: Insufficient documentation

## 2012-08-06 NOTE — Progress Notes (Signed)
Echocardiogram performed.  

## 2012-08-17 ENCOUNTER — Telehealth: Payer: Self-pay | Admitting: Cardiology

## 2012-08-17 NOTE — Telephone Encounter (Signed)
**Note De-Identified Derrious Bologna Obfuscation** Pt. given Echo results, he verbalized understanding./LV

## 2012-08-17 NOTE — Telephone Encounter (Signed)
New Problem:    Patient called returning your call.  Please call back. 

## 2012-08-27 ENCOUNTER — Other Ambulatory Visit (HOSPITAL_COMMUNITY): Payer: Self-pay | Admitting: Cardiology

## 2012-08-28 ENCOUNTER — Other Ambulatory Visit: Payer: Self-pay

## 2012-08-28 MED ORDER — ATORVASTATIN CALCIUM 40 MG PO TABS: 40.0000 mg | ORAL_TABLET | Freq: Every day | ORAL | Status: DC

## 2012-08-28 NOTE — Telephone Encounter (Signed)
Fax Received. Refill Completed. Imara Standiford Chowoe (R.M.A)   

## 2012-09-21 ENCOUNTER — Encounter: Payer: Self-pay | Admitting: Cardiology

## 2012-09-23 ENCOUNTER — Telehealth: Payer: Self-pay | Admitting: Cardiology

## 2012-09-23 ENCOUNTER — Ambulatory Visit (INDEPENDENT_AMBULATORY_CARE_PROVIDER_SITE_OTHER): Payer: PRIVATE HEALTH INSURANCE | Admitting: Cardiology

## 2012-09-23 ENCOUNTER — Encounter: Payer: Self-pay | Admitting: Cardiology

## 2012-09-23 VITALS — BP 143/72 | HR 40 | Ht 71.0 in | Wt 193.4 lb

## 2012-09-23 DIAGNOSIS — I214 Non-ST elevation (NSTEMI) myocardial infarction: Secondary | ICD-10-CM

## 2012-09-23 DIAGNOSIS — R059 Cough, unspecified: Secondary | ICD-10-CM

## 2012-09-23 DIAGNOSIS — I255 Ischemic cardiomyopathy: Secondary | ICD-10-CM

## 2012-09-23 DIAGNOSIS — I2589 Other forms of chronic ischemic heart disease: Secondary | ICD-10-CM

## 2012-09-23 DIAGNOSIS — R05 Cough: Secondary | ICD-10-CM

## 2012-09-23 DIAGNOSIS — K089 Disorder of teeth and supporting structures, unspecified: Secondary | ICD-10-CM

## 2012-09-23 DIAGNOSIS — I498 Other specified cardiac arrhythmias: Secondary | ICD-10-CM

## 2012-09-23 DIAGNOSIS — I2510 Atherosclerotic heart disease of native coronary artery without angina pectoris: Secondary | ICD-10-CM

## 2012-09-23 DIAGNOSIS — R001 Bradycardia, unspecified: Secondary | ICD-10-CM

## 2012-09-23 MED ORDER — LOSARTAN POTASSIUM 100 MG PO TABS: 100.0000 mg | ORAL_TABLET | Freq: Every day | ORAL | Status: DC

## 2012-09-23 NOTE — Assessment & Plan Note (Signed)
I'm disappointed that his LV has not improved further at this point. We do not need to discuss an ICD at this time. I will increase his losartan dose. His heart rate is slow but his blood pressure will tolerate it. He'll then see him back for followup.

## 2012-09-23 NOTE — Assessment & Plan Note (Signed)
He has a cracked tooth that we'll eventually need to be removed. However he does not have symptoms. We cannot stop his dual antiplatelet therapy at this time.

## 2012-09-23 NOTE — Assessment & Plan Note (Signed)
The patient received a drug-eluting stent for a tight LAD lesion. He does have other disease. He must remain on his dual antiplatelet therapy. I've given him permission to increase his exercise level.

## 2012-09-23 NOTE — Assessment & Plan Note (Signed)
The patient's cough improved from this which from ACE to ARB.

## 2012-09-23 NOTE — Assessment & Plan Note (Signed)
Patient has chronic bradycardia. He has never had symptoms. We are limited in the amount of beta blocker that we can use.

## 2012-09-23 NOTE — Patient Instructions (Addendum)
Your physician recommends that you schedule a follow-up appointment in: 8 weeks  Your physician has recommended you make the following change in your medication: INCREASE your losartan to 100mg  daily  Call Debby, Dr Henrietta Hoover nurse at [phone] for more samples of Brilinta when you have 1 or 2 weeks left

## 2012-09-23 NOTE — Progress Notes (Signed)
Patient ID: Samuel Walls, male   DOB: [birth year], 71 y.o.   MRN: [id number]   HPI  Patient is seen to followup coronary disease and ischemic cardiomyopathy. He is feeling well. He has a cracked tooth that needs to be removed. There has been a root canal and it is not painful and he can wait. I have advised him that he cannot have it done at this time. He must continue dual antiplatelet therapy. He has bradycardia but this is old. He's not having any symptoms. His blood pressure is 140/70 despite a heart rate of approximately 45. Initially his ejection fraction was in the 35% range with his acute LAD event. He received a drug-eluting stent to the LAD. We did a followup echo in September showing an ejection fraction in the 40% range.  Allergies  Allergen Reactions  . Vicodin (Hydrocodone-Acetaminophen)     Current Outpatient Prescriptions  Medication Sig Dispense Refill  . aspirin EC 81 MG EC tablet Take 1 tablet (81 mg total) by mouth daily.      Marland Kitchen atorvastatin (LIPITOR) 40 MG tablet Take 1 tablet (40 mg total) by mouth daily.  90 tablet  2  . losartan (COZAAR) 50 MG tablet Take 1 tablet (50 mg total) by mouth daily.  30 tablet  11  . metoprolol succinate (TOPROL-XL) 12.5 mg TB24 Take 6.25 mg by mouth 2 (two) times daily.      . Multiple Vitamin (MULTIVITAMIN WITH MINERALS) TABS Take 1 tablet by mouth daily.      . nitroGLYCERIN (NITROSTAT) 0.4 MG SL tablet Place 1 tablet (0.4 mg total) under the tongue every 5 (five) minutes as needed for chest pain (up to 3 doses).  25 tablet  3  . omega-3 acid ethyl esters (LOVAZA) 1 G capsule Take 2 g by mouth 2 (two) times daily.      . Ticagrelor (BRILINTA) 90 MG TABS tablet Take 1 tablet (90 mg total) by mouth 2 (two) times daily.  60 tablet  6    History   Social History  . Marital Status: Married    Spouse Name: N/A    Number of Children: N/A  . Years of Education: N/A   Occupational History  . Not on file.   Social History Main Topics  .  Smoking status: Never Smoker   . Smokeless tobacco: Not on file  . Alcohol Use: No  . Drug Use: No  . Sexually Active:    Other Topics Concern  . Not on file   Social History Narrative  . No narrative on file    No family history on file.  Past Medical History  Diagnosis Date  . CAD (coronary artery disease)     s/p NSTEMI - LHC 05/31/12: dLM 30-40%, pLAD 95%, oDx 70%, OM1 ostial 50% and prox 70%, prox to mid RCA 60%, antero-apical HK, EF 35%.  PCI:  Xience DES to the proximal LAD.  Marland Kitchen Hyperlipidemia LDL goal < 70   . Bradycardia     asymptomatic  . Ischemic cardiomyopathy     EF 35% by cath 05/31/12; 2-D echo 06/01/12: Moderate LVH, EF 40%, apical septal, apical lateral, apical inferior and apical anterior AK, AK of the true apex, grade 1 diastolic dysfunction, trivial AI, mild MAC, mild MR, mild to moderate LAE, mild RVE, normal RVF, mild RAE, PASP 44  . Hyperlipidemia 06/11/2012  . NSTEMI (non-ST elevated myocardial infarction)     Past Surgical History  Procedure Date  . Ankle closed  reduction   . Cardiac catheterization 05/30/12    Patient Active Problem List  Diagnosis  . Hyperlipidemia  . Cough  . CAD (coronary artery disease)  . Hyperlipidemia LDL goal < 70  . Bradycardia  . Ischemic cardiomyopathy    ROS   Patient denies fever, chills, headache, sweats, rash, change in vision, change in hearing, chest pain, cough, nausea vomiting, urinary symptoms. All other systems are reviewed and are negative.  PHYSICAL EXAM  Patient is oriented to person time and place. Affect is normal. Lungs are clear. Respiratory effort is nonlabored. Cardiac exam reveals S1 and S2. There no clicks or significant murmurs. The abdomen is soft. There is no peripheral edema.  Filed Vitals:   09/23/12 1442  BP: 143/72  Pulse: 40  Height: 5\' 11"  (1.803 m)  Weight: 193 lb 6.4 oz (87.726 kg)     ASSESSMENT & PLAN

## 2012-10-21 ENCOUNTER — Telehealth: Payer: Self-pay | Admitting: Cardiology

## 2012-10-21 NOTE — Telephone Encounter (Signed)
plz return call to pt at hM# regarding medication questions.

## 2012-10-21 NOTE — Telephone Encounter (Signed)
4 weeks of Brilinta left at the front desk per pt request.

## 2012-11-26 ENCOUNTER — Ambulatory Visit (INDEPENDENT_AMBULATORY_CARE_PROVIDER_SITE_OTHER): Payer: PRIVATE HEALTH INSURANCE | Admitting: Cardiology

## 2012-11-26 ENCOUNTER — Encounter: Payer: Self-pay | Admitting: Cardiology

## 2012-11-26 VITALS — BP 138/72 | HR 34 | Ht 71.0 in | Wt 188.8 lb

## 2012-11-26 DIAGNOSIS — I2589 Other forms of chronic ischemic heart disease: Secondary | ICD-10-CM

## 2012-11-26 DIAGNOSIS — I498 Other specified cardiac arrhythmias: Secondary | ICD-10-CM

## 2012-11-26 DIAGNOSIS — E785 Hyperlipidemia, unspecified: Secondary | ICD-10-CM

## 2012-11-26 DIAGNOSIS — R059 Cough, unspecified: Secondary | ICD-10-CM

## 2012-11-26 DIAGNOSIS — R05 Cough: Secondary | ICD-10-CM

## 2012-11-26 DIAGNOSIS — I2510 Atherosclerotic heart disease of native coronary artery without angina pectoris: Secondary | ICD-10-CM

## 2012-11-26 DIAGNOSIS — K089 Disorder of teeth and supporting structures, unspecified: Secondary | ICD-10-CM

## 2012-11-26 DIAGNOSIS — R001 Bradycardia, unspecified: Secondary | ICD-10-CM

## 2012-11-26 DIAGNOSIS — I255 Ischemic cardiomyopathy: Secondary | ICD-10-CM

## 2012-11-26 NOTE — Assessment & Plan Note (Signed)
He is on a statin. His lipids are checked by his primary physician and he will be seeing his doctor for a yearly physical in the very near future. I asked that labs be sent to [place].

## 2012-11-26 NOTE — Assessment & Plan Note (Signed)
The patient's tooth is stable. We will continue to delay any dental work until after his 2 antiplatelet therapy can be modified.  As part of today's evaluation we spent greater than 25 minutes with a full assessment. I did spend more than half the time talking with the patient and his wife about his medicines and the plans.

## 2012-11-26 NOTE — Patient Instructions (Addendum)
Your physician has recommended you make the following change in your medication: STOP your Metoprolol, otherwise, continue on your current medications as directed. Please refer to the Current Medication list given to you today.   Your physician wants you to follow-up in: June.   You will receive a reminder letter in the mail two months in advance. If you don't receive a letter, please call our office to schedule the follow-up appointment.

## 2012-11-26 NOTE — Assessment & Plan Note (Signed)
Coronary disease is stable. He had a drug-eluting stent placed July, 2013. He is remaining on dual antiplatelet therapy.

## 2012-11-26 NOTE — Assessment & Plan Note (Signed)
He is not having any recurrent significant cough. No further workup.

## 2012-11-26 NOTE — Progress Notes (Signed)
HPI   Patient is seen for followup of coronary disease. He's actually doing very well. He had an intervention in July, 2013. He's feeling well with no pain. He does have significant resting sinus bradycardia. He has not had any syncope or presyncope. He is fully active.  Allergies  Allergen Reactions  . Vicodin (Hydrocodone-Acetaminophen)     Current Outpatient Prescriptions  Medication Sig Dispense Refill  . aspirin EC 81 MG EC tablet Take 1 tablet (81 mg total) by mouth daily.      Marland Kitchen atorvastatin (LIPITOR) 40 MG tablet Take 1 tablet (40 mg total) by mouth daily.  90 tablet  2  . losartan (COZAAR) 100 MG tablet Take 1 tablet (100 mg total) by mouth daily.  30 tablet  11  . metoprolol succinate (TOPROL-XL) 12.5 mg TB24 Take 6.25 mg by mouth 2 (two) times daily.      . Multiple Vitamin (MULTIVITAMIN WITH MINERALS) TABS Take 1 tablet by mouth daily.      . nitroGLYCERIN (NITROSTAT) 0.4 MG SL tablet Place 1 tablet (0.4 mg total) under the tongue every 5 (five) minutes as needed for chest pain (up to 3 doses).  25 tablet  3  . omega-3 acid ethyl esters (LOVAZA) 1 G capsule Take 2 g by mouth 2 (two) times daily.      . Ticagrelor (BRILINTA) 90 MG TABS tablet Take 1 tablet (90 mg total) by mouth 2 (two) times daily.  60 tablet  6    History   Social History  . Marital Status: Married    Spouse Name: N/A    Number of Children: N/A  . Years of Education: N/A   Occupational History  . Not on file.   Social History Main Topics  . Smoking status: Never Smoker   . Smokeless tobacco: Not on file  . Alcohol Use: No  . Drug Use: No  . Sexually Active:    Other Topics Concern  . Not on file   Social History Narrative  . No narrative on file    No family history on file.  Past Medical History  Diagnosis Date  . CAD (coronary artery disease)     s/p NSTEMI - LHC 05/31/12: dLM 30-40%, pLAD 95%, oDx 70%, OM1 ostial 50% and prox 70%, prox to mid RCA 60%, antero-apical HK, EF 35%.   PCI:  Xience DES to the proximal LAD.  Marland Kitchen Hyperlipidemia LDL goal < 70   . Bradycardia     asymptomatic  . Ischemic cardiomyopathy     EF 35% by cath 05/31/12; 2-D echo 06/01/12: Moderate LVH, EF 40%, apical septal, apical lateral, apical inferior and apical anterior AK, AK of the true apex, grade 1 diastolic dysfunction, trivial AI, mild MAC, mild MR, mild to moderate LAE, mild RVE, normal RVF, mild RAE, PASP 44  . Hyperlipidemia 06/11/2012  . NSTEMI (non-ST elevated myocardial infarction)     Past Surgical History  Procedure Date  . Ankle closed reduction   . Cardiac catheterization 05/30/12    Patient Active Problem List  Diagnosis  . Hyperlipidemia  . Cough  . CAD (coronary artery disease)  . Hyperlipidemia LDL goal < 70  . Bradycardia  . Ischemic cardiomyopathy  . Tooth disorder    ROS   Patient denies fever, chills, headache, sweats, rash, change in vision, change in hearing, chest pain, cough, nausea vomiting, urinary symptoms. All other systems are reviewed and are negative.  PHYSICAL EXAM  Patient is here with his  wife. He is oriented to person time and place. Affect is normal. There is no jugulovenous distention. Lungs are clear. Respiratory effort is nonlabored. Cardiac exam reveals S1 and S2. There no clicks or significant murmurs. The abdomen is soft. There is no peripheral edema.  Filed Vitals:   11/26/12 0914  BP: 138/72  Pulse: 34  Height: 5\' 11"  (1.803 m)  Weight: 188 lb 12.8 oz (85.639 kg)   EKG is done today and reviewed by me. He has his prior anterior MI. The anterior T-wave changes are improved since the prior tracing. He does have marked sinus bradycardia.  ASSESSMENT & PLAN

## 2012-11-26 NOTE — Assessment & Plan Note (Signed)
The patient has significant resting sinus bradycardia. He has no significant symptoms. As part of his evaluation today we walked him in the office. He had very good tolerance. In the distance that we walked he had no significant symptoms. His heart rate came up into the range of 65. He is on an extremely small dose of beta blocker. He does not need to monitor. However I decided to stop his beta blocker completely.

## 2012-11-26 NOTE — Assessment & Plan Note (Signed)
I will plan to follow his left ventricular function carefully. He is on an ARB. I cannot use a beta blocker. I will follow up his LV function at a later date again.

## 2013-02-25 ENCOUNTER — Other Ambulatory Visit: Payer: Self-pay | Admitting: *Deleted

## 2013-02-25 MED ORDER — ATORVASTATIN CALCIUM 40 MG PO TABS: 40.0000 mg | ORAL_TABLET | Freq: Every day | ORAL | Status: DC

## 2013-05-08 ENCOUNTER — Encounter: Payer: Self-pay | Admitting: Cardiology

## 2013-05-11 ENCOUNTER — Ambulatory Visit (INDEPENDENT_AMBULATORY_CARE_PROVIDER_SITE_OTHER): Payer: PRIVATE HEALTH INSURANCE | Admitting: Cardiology

## 2013-05-11 ENCOUNTER — Encounter: Payer: Self-pay | Admitting: Cardiology

## 2013-05-11 VITALS — BP 120/72 | HR 44 | Ht 71.0 in | Wt 188.0 lb

## 2013-05-11 DIAGNOSIS — I2589 Other forms of chronic ischemic heart disease: Secondary | ICD-10-CM

## 2013-05-11 DIAGNOSIS — I2510 Atherosclerotic heart disease of native coronary artery without angina pectoris: Secondary | ICD-10-CM

## 2013-05-11 DIAGNOSIS — R001 Bradycardia, unspecified: Secondary | ICD-10-CM

## 2013-05-11 DIAGNOSIS — I255 Ischemic cardiomyopathy: Secondary | ICD-10-CM

## 2013-05-11 DIAGNOSIS — I498 Other specified cardiac arrhythmias: Secondary | ICD-10-CM

## 2013-05-11 MED ORDER — ATORVASTATIN CALCIUM 40 MG PO TABS: 40.0000 mg | ORAL_TABLET | Freq: Every day | ORAL | Status: DC

## 2013-05-11 NOTE — Assessment & Plan Note (Signed)
Coronary disease is stable. It will be one year in time to stop his dual antiplatelet therapy.

## 2013-05-11 NOTE — Progress Notes (Signed)
HPI  Patient is seen for followup of his coronary artery disease. He received a drug-eluting stent in July, 2013. He's done very well. He has been on aspirin and Brilinta since that time. I will stop the Brilinta in a few weeks as it has been one year. He has some left ventricular dysfunction. He is on an ACE inhibitor. I'm not able to use a beta blocker because of his resting heart rate. Up to this point I have chosen not to use spironolactone.  Allergies  Allergen Reactions  . Vicodin (Hydrocodone-Acetaminophen)     Current Outpatient Prescriptions  Medication Sig Dispense Refill  . aspirin EC 81 MG EC tablet Take 1 tablet (81 mg total) by mouth daily.      Marland Kitchen atorvastatin (LIPITOR) 40 MG tablet Take 1 tablet (40 mg total) by mouth daily.  30 tablet  4  . losartan (COZAAR) 100 MG tablet Take 1 tablet (100 mg total) by mouth daily.  30 tablet  11  . Multiple Vitamin (MULTIVITAMIN WITH MINERALS) TABS Take 1 tablet by mouth daily.      . nitroGLYCERIN (NITROSTAT) 0.4 MG SL tablet Place 1 tablet (0.4 mg total) under the tongue every 5 (five) minutes as needed for chest pain (up to 3 doses).  25 tablet  3  . omega-3 acid ethyl esters (LOVAZA) 1 G capsule Take 2 g by mouth 2 (two) times daily.      . Ticagrelor (BRILINTA) 90 MG TABS tablet Take 1 tablet (90 mg total) by mouth 2 (two) times daily.  60 tablet  6   No current facility-administered medications for this visit.    History   Social History  . Marital Status: Married    Spouse Name: N/A    Number of Children: N/A  . Years of Education: N/A   Occupational History  . Not on file.   Social History Main Topics  . Smoking status: Never Smoker   . Smokeless tobacco: Not on file  . Alcohol Use: No  . Drug Use: No  . Sexually Active:    Other Topics Concern  . Not on file   Social History Narrative  . No narrative on file    No family history on file.  Past Medical History  Diagnosis Date  . CAD (coronary artery  disease)   . Hyperlipidemia LDL goal < 70   . Bradycardia   . Ischemic cardiomyopathy   . Hyperlipidemia 06/11/2012    Past Surgical History  Procedure Laterality Date  . Ankle closed reduction    . Cardiac catheterization  05/30/12    Patient Active Problem List   Diagnosis Date Noted  . 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 CAD (coronary artery disease)   . Hyperlipidemia LDL goal < 70   . Bradycardia   . Ischemic cardiomyopathy   . Hyperlipidemia 06/11/2012  . Cough 06/11/2012    ROS   Patient denies fever, chills, headache, sweats, rash, change in vision, change in hearing, chest pain, cough, nausea vomiting, urinary symptoms. All other systems are reviewed and are negative.  PHYSICAL EXAM   Patient is here with his wife. He is oriented to person time and place. Affect is normal. There is no jugulovenous distention. Lungs are clear. Respiratory effort is nonlabored. Cardiac exam reveals S1 and S2. There no clicks or significant hemorrhage. The abdomen is soft. There is no peripheral edema.  Filed Vitals:   05/11/13 1000  BP: 120/72  Pulse: 44  Height: 5\' 11"  (  1.803 m)  Weight: 188 lb (85.276 kg)  SpO2: 97%    ASSESSMENT & PLAN

## 2013-05-11 NOTE — Patient Instructions (Addendum)
Your physician has recommended you make the following change in your medication: We are giving you a 2 week supply of Brilinta, stop taking Brilinta once you have taken the 2 week supply.  Your physician wants you to follow-up in: 6 months. You will receive a reminder letter in the mail two months in advance. If you don't receive a letter, please call our office to schedule the follow-up appointment.

## 2013-05-11 NOTE — Assessment & Plan Note (Signed)
Echo in September, 2013 revealed an ejection fraction of 40%. All plan a repeat echo I see him in followup. At that time I will make a decision about any further medicine changes for this issue.

## 2013-05-11 NOTE — Assessment & Plan Note (Signed)
Patient has significant sinus bradycardia that is asymptomatic. No further workup is needed but I cannot use a beta blocker for his LV dysfunction.

## 2013-06-03 ENCOUNTER — Other Ambulatory Visit: Payer: Self-pay

## 2013-06-03 MED ORDER — NITROGLYCERIN 0.4 MG SL SUBL: 0.4000 mg | SUBLINGUAL_TABLET | SUBLINGUAL | Status: DC | PRN

## 2013-10-20 ENCOUNTER — Other Ambulatory Visit: Payer: Self-pay | Admitting: Cardiology

## 2013-11-01 ENCOUNTER — Ambulatory Visit (INDEPENDENT_AMBULATORY_CARE_PROVIDER_SITE_OTHER): Payer: Medicare Other | Admitting: Cardiology

## 2013-11-01 ENCOUNTER — Encounter: Payer: Self-pay | Admitting: Cardiology

## 2013-11-01 VITALS — BP 160/80 | HR 44 | Ht 66.0 in | Wt 192.0 lb

## 2013-11-01 DIAGNOSIS — I2510 Atherosclerotic heart disease of native coronary artery without angina pectoris: Secondary | ICD-10-CM

## 2013-11-01 DIAGNOSIS — I255 Ischemic cardiomyopathy: Secondary | ICD-10-CM

## 2013-11-01 DIAGNOSIS — R001 Bradycardia, unspecified: Secondary | ICD-10-CM

## 2013-11-01 DIAGNOSIS — I498 Other specified cardiac arrhythmias: Secondary | ICD-10-CM

## 2013-11-01 DIAGNOSIS — E785 Hyperlipidemia, unspecified: Secondary | ICD-10-CM

## 2013-11-01 DIAGNOSIS — I2589 Other forms of chronic ischemic heart disease: Secondary | ICD-10-CM

## 2013-11-01 NOTE — Assessment & Plan Note (Signed)
His lipids are being treated per guidelines. No change in therapy.

## 2013-11-01 NOTE — Patient Instructions (Signed)
Your physician wants you to follow-up in:  6 months.  You will receive a reminder letter in the mail two months in advance. If you don't receive a letter, please call our office to schedule the follow-up appointment.  Your physician has requested that you have an echocardiogram. Echocardiography is a painless test that uses sound waves to create images of your heart. It provides your doctor with information about the size and shape of your heart and how well your heart's chambers and valves are working. This procedure takes approximately one hour. There are no restrictions for this procedure.  Your physician recommends that you continue on your current medications as directed. Please refer to the Current Medication list given to you today.   When you have dental procedure done Dr. Myrtis Ser would prefer you continue aspirin but if dentist requires you stop aspirin you may hold it for 3 days prior to procedure.

## 2013-11-01 NOTE — Assessment & Plan Note (Signed)
It is now time for followup 2-D echo. This will be done. When I see the results I will decide with him whether or not we will use spironolactone. I will plan 6 month followup.

## 2013-11-01 NOTE — Assessment & Plan Note (Signed)
Patient is doing well. His dual antiplatelet therapy was switched to aspirin only in July, 2014. He received a full year. Recent data suggests that we may want to use dual antiplatelet therapy longer than previously suggested. There is no formal guideline recommendation from the [facility] cardiology yet. The patient is stable. He needs dental work soon. He and I have decided that we will not add Plavix back to his therapy. This will be kept in mind over time as we wait for further recommendations from the [facility].

## 2013-11-01 NOTE — Progress Notes (Signed)
    HPI  Patient is seen today to followup coronary disease. He received a stent for a non-STEMI in July, 2013. At the one-year mark we stopped his Brilinta. He's done well. He's not having any chest pain or shortness of breath. He does have some left ventricular dysfunction. It is time for followup echo and this will be arranged. He is on an ARB. His resting heart rate is 44 and we cannot use a beta blocker. After his followup echo I will decide about whether we will add spironolactone.  Allergies  Allergen Reactions  . Vicodin [Hydrocodone-Acetaminophen]     Current Outpatient Prescriptions  Medication Sig Dispense Refill  . atorvastatin (LIPITOR) 40 MG tablet Take 1 tablet (40 mg total) by mouth daily.  90 tablet  3  . losartan (COZAAR) 100 MG tablet TAKE ONE TABLET BY MOUTH EVERY DAY  30 tablet  1  . Multiple Vitamin (MULTIVITAMIN WITH MINERALS) TABS Take 1 tablet by mouth daily.      . nitroGLYCERIN (NITROSTAT) 0.4 MG SL tablet Place 1 tablet (0.4 mg total) under the tongue every 5 (five) minutes as needed for chest pain (up to 3 doses).  25 tablet  3  . omega-3 acid ethyl esters (LOVAZA) 1 G capsule Take 2 g by mouth 2 (two) times daily.       No current facility-administered medications for this visit.    History   Social History  . Marital Status: Married    Spouse Name: N/A    Number of Children: N/A  . Years of Education: N/A   Occupational History  . Not on file.   Social History Main Topics  . Smoking status: Never Smoker   . Smokeless tobacco: Not on file  . Alcohol Use: No  . Drug Use: No  . Sexual Activity:    Other Topics Concern  . Not on file   Social History Narrative  . No narrative on file    No family history on file.  Past Medical History  Diagnosis Date  . CAD (coronary artery disease)   . Hyperlipidemia LDL goal < 70   . Bradycardia   . Ischemic cardiomyopathy   . Hyperlipidemia 06/11/2012    Past Surgical History  Procedure Laterality  Date  . Ankle closed reduction    . Cardiac catheterization  05/30/12    Patient Active Problem List   Diagnosis Date Noted  . 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 CAD (coronary artery disease)   . Hyperlipidemia LDL goal < 70   . Bradycardia   . Ischemic cardiomyopathy   . Hyperlipidemia 06/11/2012  . Cough 06/11/2012    ROS   Patient denies fever, chills, headache, sweats, rash, change in vision, change in hearing, chest pain, cough, nausea vomiting, urinary symptoms. All other systems are reviewed and are negative.  PHYSICAL EXAM  Patient is oriented to person time and place. Affect is normal. There is no jugulovenous distention. Lungs are clear. Respiratory effort is nonlabored. Cardiac exam reveals S1-S2. There no clicks or significant murmurs. The abdomen is soft. There is no peripheral edema.  Filed Vitals:   11/01/13 1039  BP: 160/80  Pulse: 44  Height: 5\' 6"  (1.676 m)  Weight: 192 lb (87.091 kg)     ASSESSMENT & PLAN

## 2013-11-01 NOTE — Assessment & Plan Note (Signed)
He has significant bradycardia that is asymptomatic. Beta blocker cannot be used.

## 2013-11-29 ENCOUNTER — Ambulatory Visit (HOSPITAL_COMMUNITY): Payer: Medicare HMO | Attending: Cardiology | Admitting: Radiology

## 2013-11-29 ENCOUNTER — Encounter: Payer: Self-pay | Admitting: Cardiology

## 2013-11-29 DIAGNOSIS — I252 Old myocardial infarction: Secondary | ICD-10-CM | POA: Insufficient documentation

## 2013-11-29 DIAGNOSIS — I2510 Atherosclerotic heart disease of native coronary artery without angina pectoris: Secondary | ICD-10-CM

## 2013-11-29 DIAGNOSIS — I255 Ischemic cardiomyopathy: Secondary | ICD-10-CM

## 2013-11-29 DIAGNOSIS — E785 Hyperlipidemia, unspecified: Secondary | ICD-10-CM | POA: Insufficient documentation

## 2013-11-29 DIAGNOSIS — I2589 Other forms of chronic ischemic heart disease: Secondary | ICD-10-CM | POA: Insufficient documentation

## 2013-11-29 DIAGNOSIS — R001 Bradycardia, unspecified: Secondary | ICD-10-CM

## 2013-11-29 HISTORY — PX: TRANSTHORACIC ECHOCARDIOGRAM: SHX275

## 2013-11-29 NOTE — Progress Notes (Signed)
Echocardiogram performed.  

## 2013-11-30 ENCOUNTER — Encounter: Payer: Self-pay | Admitting: Cardiology

## 2013-12-16 ENCOUNTER — Other Ambulatory Visit: Payer: Self-pay

## 2013-12-16 MED ORDER — LOSARTAN POTASSIUM 100 MG PO TABS: ORAL_TABLET | ORAL | Status: DC

## 2013-12-24 ENCOUNTER — Encounter: Payer: Self-pay | Admitting: Cardiology

## 2014-04-29 ENCOUNTER — Ambulatory Visit (INDEPENDENT_AMBULATORY_CARE_PROVIDER_SITE_OTHER): Payer: Medicare HMO | Admitting: Cardiology

## 2014-04-29 ENCOUNTER — Encounter: Payer: Self-pay | Admitting: Cardiology

## 2014-04-29 VITALS — BP 152/72 | HR 40 | Ht 66.0 in | Wt 187.8 lb

## 2014-04-29 DIAGNOSIS — I255 Ischemic cardiomyopathy: Secondary | ICD-10-CM

## 2014-04-29 DIAGNOSIS — I2589 Other forms of chronic ischemic heart disease: Secondary | ICD-10-CM

## 2014-04-29 DIAGNOSIS — E785 Hyperlipidemia, unspecified: Secondary | ICD-10-CM

## 2014-04-29 DIAGNOSIS — I2510 Atherosclerotic heart disease of native coronary artery without angina pectoris: Secondary | ICD-10-CM

## 2014-04-29 DIAGNOSIS — I10 Essential (primary) hypertension: Secondary | ICD-10-CM

## 2014-04-29 DIAGNOSIS — I498 Other specified cardiac arrhythmias: Secondary | ICD-10-CM

## 2014-04-29 DIAGNOSIS — R001 Bradycardia, unspecified: Secondary | ICD-10-CM

## 2014-04-29 NOTE — Assessment & Plan Note (Signed)
He is on guideline directed therapy for his lipids. No change in therapy.

## 2014-04-29 NOTE — Progress Notes (Signed)
Patient ID: Samuel Walls, male   DOB: 01-Mar-1941, 73 y.o.   MRN: [id number]    HPI  Patient returns today to followup coronary disease and left ventricular dysfunction. He had a non-STEMI treated with a stent in July, 2013. After the appropriate amount of time his dual antiplatelet therapy was changed to aspirin only. He is on an ARB. He has marked sinus bradycardia that is asymptomatic. Because of this he is on a beta blocker. I saw him last December, 2014. We arrange for an echo to make final decisions about any changes in his medications. We were pleased that his ejection fraction is now in the 50% range with a focal wall motion abnormality. Therefore his meds were not adjusted any further. He's feeling well with no symptoms.  Allergies  Allergen Reactions  . Vicodin [Hydrocodone-Acetaminophen]     Current Outpatient Prescriptions  Medication Sig Dispense Refill  . atorvastatin (LIPITOR) 40 MG tablet Take 1 tablet (40 mg total) by mouth daily.  90 tablet  3  . losartan (COZAAR) 100 MG tablet TAKE ONE TABLET BY MOUTH EVERY DAY  30 tablet  6  . Multiple Vitamin (MULTIVITAMIN WITH MINERALS) TABS Take 1 tablet by mouth daily.      . nitroGLYCERIN (NITROSTAT) 0.4 MG SL tablet Place 1 tablet (0.4 mg total) under the tongue every 5 (five) minutes as needed for chest pain (up to 3 doses).  25 tablet  3  . omega-3 acid ethyl esters (LOVAZA) 1 G capsule Take 2 g by mouth 2 (two) times daily.       No current facility-administered medications for this visit.    History   Social History  . Marital Status: Married    Spouse Name: N/A    Number of Children: N/A  . Years of Education: N/A   Occupational History  . Not on file.   Social History Main Topics  . Smoking status: Never Smoker   . Smokeless tobacco: Not on file  . Alcohol Use: No  . Drug Use: No  . Sexual Activity:    Other Topics Concern  . Not on file   Social History Narrative  . No narrative on file    No family  history on file.  Past Medical History  Diagnosis Date  . CAD (coronary artery disease)   . Hyperlipidemia LDL goal < 70   . Bradycardia   . Ischemic cardiomyopathy   . Hyperlipidemia 06/11/2012    Past Surgical History  Procedure Laterality Date  . Ankle closed reduction    . Cardiac catheterization  05/30/12    Patient Active Problem List   Diagnosis Date Noted  . 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 CAD (coronary artery disease)   . Hyperlipidemia LDL goal < 70   . Bradycardia   . Ischemic cardiomyopathy   . Hyperlipidemia 06/11/2012  . Cough 06/11/2012    ROS   Patient denies fever, chills, headache, sweats, rash, change in vision, change in hearing, chest pain, cough, nausea or vomiting, urinary symptoms. All other systems are reviewed and are negative.  PHYSICAL EXAM  Patient looks quite good. He is oriented to person time and place. Affect is normal. Head is atraumatic. Sclera and conjunctiva are normal. There is no jugulovenous distention. Lungs are clear. Respiratory effort is nonlabored. Cardiac exam reveals S1 and S2. The rate is slow. Abdomen is soft. There is no peripheral edema. There are no musculoskeletal deformities. There are no skin rashes.  Filed Vitals:   04/29/14  1101  BP: 152/72  Pulse: 40  Height: 5\' 6"  (1.676 m)  Weight: 187 lb 12.8 oz (85.186 kg)   EKG is done today and reviewed by me. He has old marked sinus bradycardia. His EKG is otherwise unchanged. I have compared it with old tracings.  ASSESSMENT & PLAN

## 2014-04-29 NOTE — Assessment & Plan Note (Signed)
Coronary disease is stable. He had a drug-eluting stent in July, 2013. He's had good return of function. He's not having symptoms. He is on appropriate therapy. No change in therapy.

## 2014-04-29 NOTE — Patient Instructions (Signed)
Your physician recommends that you continue on your current medications as directed. Please refer to the Current Medication list given to you today.  Dr Ron Parker recommends that you purchase a BP cuff and machine. Please contact Dr Kae Heller nurse, [facility], at [phone] to report your blood pressure reading in 2 weeks.  Your physician recommends that you schedule a follow-up appointment in: 6 months.

## 2014-04-29 NOTE — Assessment & Plan Note (Signed)
He has marked bradycardia. It is asymptomatic. Beta blockers cannot be used. No change in therapy.

## 2014-04-29 NOTE — Assessment & Plan Note (Signed)
Systolic pressure is mildly elevated today. He will obtain a blood pressure cuff and call blood pressures in to [place]. If he needs an additional medication, I will consider spironolactone.

## 2014-04-29 NOTE — Assessment & Plan Note (Signed)
His EF improved by echo in January, 2015. No change in therapy.

## 2014-05-04 ENCOUNTER — Ambulatory Visit: Payer: Medicare HMO | Admitting: Cardiology

## 2014-05-11 ENCOUNTER — Telehealth: Payer: Self-pay | Admitting: Cardiology

## 2014-05-11 NOTE — Telephone Encounter (Signed)
The pt had OV with Dr Ron Parker on 04/29/14 and was advised to monitor his BP for 2 weeks then call office to report his BP reading.  04/29/14  BP= 143/82  HR= 52  Taken at 8 pm 04/30/14  BP= 144/54  HR= 45  Taken at 4 pm 05/01/14  BP= 142/87  HR= 42  Taken at 9 am 05/02/14  BP= 125/64  HR= 44  Taken at 12 noon 05/03/14  BP= 128/65  HR= 42  Taken at 5 pm 05/04/14  BP= 134/66  HR= 42  Taken at 4 pm 05/05/14  BP= 162/77  HR= 43  Taken at 7 pm 05/06/14  BP= 127/74  HR= 45  Taken at 4 pm 05/07/14  BP= 152/86  HR= ?    Taken at 6 am before he took his meds 05/07/14  BP= 132/78  HR= 49  Taken at 12 noon 05/08/14  BP= 142/87  HR= 49  Taken at 10 am 05/09/14  BP= 132/76  HR= 47  Taken at 5 pm 05/10/14  BP= 139/69  HR= 41  Taken at 5 pm 05/11/14  BP= 165/84  HR= 41  Taken at 7 am before he took his meds

## 2014-05-11 NOTE — Telephone Encounter (Signed)
New message         Pt calling to give nurse 2 weeks worth of bp readings

## 2014-05-25 ENCOUNTER — Other Ambulatory Visit: Payer: Self-pay | Admitting: Cardiology

## 2014-06-24 ENCOUNTER — Encounter: Payer: Self-pay | Admitting: Cardiology

## 2014-06-28 ENCOUNTER — Other Ambulatory Visit: Payer: Self-pay | Admitting: Cardiology

## 2014-07-02 ENCOUNTER — Other Ambulatory Visit: Payer: Self-pay | Admitting: Cardiology

## 2014-07-04 ENCOUNTER — Other Ambulatory Visit: Payer: Self-pay

## 2014-07-04 MED ORDER — LOSARTAN POTASSIUM 100 MG PO TABS: ORAL_TABLET | ORAL | Status: DC

## 2014-07-07 ENCOUNTER — Encounter: Payer: Self-pay | Admitting: Cardiology

## 2014-07-07 NOTE — Progress Notes (Unsigned)
Patient ID: Samuel Walls, male   DOB: Sep 13, 1941, 73 y.o.   MRN: [id number]  The patient was seen in the office in early June, 2015. He called back several weeks later with blood pressure and pulse data. His heart rate ranges from 45-55. Blood pressure ranged 130/80-160/85. I made the decision to not change his medicines at that time. Further followup when he is seen in the office next  Samuel Walls

## 2014-10-26 ENCOUNTER — Encounter: Payer: Self-pay | Admitting: Cardiology

## 2014-10-26 ENCOUNTER — Ambulatory Visit (INDEPENDENT_AMBULATORY_CARE_PROVIDER_SITE_OTHER): Payer: Medicare HMO | Admitting: Cardiology

## 2014-10-26 VITALS — BP 150/88 | HR 49 | Ht 66.0 in | Wt 191.6 lb

## 2014-10-26 DIAGNOSIS — I255 Ischemic cardiomyopathy: Secondary | ICD-10-CM

## 2014-10-26 DIAGNOSIS — E785 Hyperlipidemia, unspecified: Secondary | ICD-10-CM

## 2014-10-26 DIAGNOSIS — I2510 Atherosclerotic heart disease of native coronary artery without angina pectoris: Secondary | ICD-10-CM

## 2014-10-26 DIAGNOSIS — I10 Essential (primary) hypertension: Secondary | ICD-10-CM

## 2014-10-26 DIAGNOSIS — R001 Bradycardia, unspecified: Secondary | ICD-10-CM

## 2014-10-26 MED ORDER — AMLODIPINE BESYLATE 2.5 MG PO TABS: 2.5000 mg | ORAL_TABLET | Freq: Every day | ORAL | Status: DC

## 2014-10-26 NOTE — Patient Instructions (Signed)
**Note De-Identified Shanley Furlough Obfuscation** Your physician has recommended you make the following change in your medication: start taking Amlodipine 2.5 mg daily  Your physician wants you to follow-up in: 6 months.You will receive a reminder letter in the mail two months in advance. If you don't receive a letter, please call our office to schedule the follow-up appointment.

## 2014-10-26 NOTE — Assessment & Plan Note (Signed)
He will not tolerate a beta blocker. He is on an ARB. With return of good LV function I chosen not to add spironolactone. However his blood pressure is borderline high. I've chosen at 2.5 mg of amlodipine.

## 2014-10-26 NOTE — Assessment & Plan Note (Signed)
The patient has done well. He is not having any significant symptoms. No change in therapy now.

## 2014-10-26 NOTE — Progress Notes (Signed)
Patient ID: Samuel Walls, male   DOB: 1941-03-08, 73 y.o.   MRN: [id number]    HPI Patient is seen today to follow-up coronary artery disease. I saw him last June, 2015. He had a non-STEMI treated with a stent in July, 2013. He is on appropriate medications. His ejection fraction improved back to the 50% range. He is done very well. We watch his blood pressure and his heart rate carefully. His resting heart rate is in the 40s. His systolic blood pressure runs in the range of 145-150. When he checks it at home it is in the range of 145. He is not having any syncope or presyncope.  Allergies  Allergen Reactions  . Vicodin [Hydrocodone-Acetaminophen]     Current Outpatient Prescriptions  Medication Sig Dispense Refill  . aspirin EC 81 MG tablet Take 81 mg by mouth daily.    Marland Kitchen atorvastatin (LIPITOR) 40 MG tablet TAKE ONE TABLET BY MOUTH ONCE DAILY 90 tablet 1  . losartan (COZAAR) 100 MG tablet TAKE ONE TABLET BY MOUTH EVERY DAY 90 tablet 1  . Multiple Vitamin (MULTIVITAMIN WITH MINERALS) TABS Take 1 tablet by mouth daily.    Marland Kitchen omega-3 acid ethyl esters (LOVAZA) 1 G capsule Take 2 g by mouth 2 (two) times daily.    . nitroGLYCERIN (NITROSTAT) 0.4 MG SL tablet Place 1 tablet (0.4 mg total) under the tongue every 5 (five) minutes as needed for chest pain (up to 3 doses). 25 tablet 3   No current facility-administered medications for this visit.    History   Social History  . Marital Status: Married    Spouse Name: N/A    Number of Children: N/A  . Years of Education: N/A   Occupational History  . Not on file.   Social History Main Topics  . Smoking status: Never Smoker   . Smokeless tobacco: Not on file  . Alcohol Use: No  . Drug Use: No  . Sexual Activity: Not on file   Other Topics Concern  . Not on file   Social History Narrative    History reviewed. No pertinent family history.  Past Medical History  Diagnosis Date  . CAD (coronary artery disease)   . Hyperlipidemia  LDL goal < 70   . Bradycardia   . Ischemic cardiomyopathy   . Hyperlipidemia 06/11/2012    Past Surgical History  Procedure Laterality Date  . Ankle closed reduction    . Cardiac catheterization  05/30/12    Patient Active Problem List   Diagnosis Date Noted  . Essential hypertension 04/29/2014  . 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 CAD (coronary artery disease)   . Hyperlipidemia LDL goal < 70   . Bradycardia   . Ischemic cardiomyopathy   . Hyperlipidemia 06/11/2012  . Cough 06/11/2012    ROS  Patient denies fever, chills, headache, sweats, rash, change in vision, change in hearing, chest pain, cough, nausea or vomiting, urinary symptoms. All other systems are reviewed and are negative.  PHYSICAL EXAM Patient is oriented to person time and place. Affect is normal. Head is atraumatic. Sclera and conjunctiva are normal. There is no jugulovenous distention. Lungs are clear. Respiratory effort is nonlabored. Cardiac exam reveals S1 and S2. The abdomen is soft. There is no peripheral edema.  Filed Vitals:   10/26/14 0908  BP: 150/88  Pulse: 49  Height: 5\' 6"  (1.676 m)  Weight: 191 lb 9.6 oz (86.909 kg)  SpO2: 97%   EKG is done today and reviewed by me.  He has sinus bradycardia with a rate of 49. There are PACs noted. 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  ASSESSMENT & PLAN

## 2014-10-26 NOTE — Assessment & Plan Note (Signed)
He is on guideline directed therapy. 

## 2014-10-26 NOTE — Assessment & Plan Note (Signed)
Blood pressure continues to be mildly elevated. In his case of feel we should bring it down further. We will add amlodipine 2.5 mg daily.

## 2014-10-26 NOTE — Assessment & Plan Note (Signed)
His bradycardia is asymptomatic. We cannot use a beta blocker. No change in therapy.

## 2014-10-27 ENCOUNTER — Encounter (HOSPITAL_COMMUNITY): Payer: Self-pay | Admitting: Cardiovascular Disease

## 2014-11-26 ENCOUNTER — Other Ambulatory Visit: Payer: Self-pay | Admitting: Cardiology

## 2014-12-30 ENCOUNTER — Encounter: Payer: Self-pay | Admitting: Cardiology

## 2014-12-31 ENCOUNTER — Other Ambulatory Visit: Payer: Self-pay | Admitting: Cardiology

## 2015-01-02 NOTE — Telephone Encounter (Signed)
NEEDS APPT FOR REFILLS

## 2015-02-20 ENCOUNTER — Other Ambulatory Visit: Payer: Self-pay | Admitting: Cardiology

## 2015-03-28 ENCOUNTER — Encounter: Payer: Self-pay | Admitting: Cardiology

## 2015-04-27 ENCOUNTER — Telehealth: Payer: Self-pay | Admitting: *Deleted

## 2015-04-27 NOTE — Telephone Encounter (Signed)
I CALLED PT TO GET FAMILY HX/STATUS.  PT STATED HE WOULD RATHER WAIT AND DO IT ON Monday WHEN HE COMES IN FOR HIS APPT.

## 2015-05-01 ENCOUNTER — Encounter: Payer: Self-pay | Admitting: Cardiology

## 2015-05-01 ENCOUNTER — Ambulatory Visit (INDEPENDENT_AMBULATORY_CARE_PROVIDER_SITE_OTHER): Payer: Medicare HMO | Admitting: Cardiology

## 2015-05-01 VITALS — BP 132/60 | HR 50 | Ht 70.0 in | Wt 191.8 lb

## 2015-05-01 DIAGNOSIS — E785 Hyperlipidemia, unspecified: Secondary | ICD-10-CM

## 2015-05-01 DIAGNOSIS — R001 Bradycardia, unspecified: Secondary | ICD-10-CM

## 2015-05-01 DIAGNOSIS — I2510 Atherosclerotic heart disease of native coronary artery without angina pectoris: Secondary | ICD-10-CM

## 2015-05-01 DIAGNOSIS — I10 Essential (primary) hypertension: Secondary | ICD-10-CM

## 2015-05-01 DIAGNOSIS — I255 Ischemic cardiomyopathy: Secondary | ICD-10-CM

## 2015-05-01 NOTE — Assessment & Plan Note (Signed)
He has asymptomatic sinus bradycardia. We cannot use beta blockers for therapy. Fortunately his left ventricular function improved over time without the use of a beta blocker.

## 2015-05-01 NOTE — Progress Notes (Signed)
Cardiology Office Note   Date:  05/01/2015   ID:  Samuel Walls, DOB 1941/07/24, MRN [id number]  PCP:  Nicoletta Dress, MD  Cardiologist:  Dola Argyle, MD   Chief Complaint  Patient presents with  . Appointment    Follow-up coronary disease      History of Present Illness: Samuel Walls is a 74 y.o. male who presents today to follow up coronary disease. He is actually doing well. He has sinus bradycardia that is asymptomatic. We are not able to use any beta blockers. He had left ventricular dysfunction in the past. Fortunately this improved over time with an ARB. He is not having any significant chest pain or shortness of breath. His most recent echo showed improvement of his ejection fraction up to 50% .    Past Medical History  Diagnosis Date  . CAD (coronary artery disease)   . Hyperlipidemia LDL goal < 70   . Bradycardia   . Ischemic cardiomyopathy   . Hyperlipidemia 06/11/2012    Past Surgical History  Procedure Laterality Date  . Ankle closed reduction    . Cardiac catheterization  05/30/12  . Left heart catheterization with coronary angiogram N/A 05/31/2012    Procedure: LEFT HEART CATHETERIZATION WITH CORONARY ANGIOGRAM;  Surgeon: Sherren Mocha, MD;  Location[facility] C[facility];  Service: Cardiovascular;  Laterality: N/A;    Patient Active Problem List   Diagnosis Date Noted  . Essential hypertension 04/29/2014  . 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 CAD (coronary artery disease)   . Hyperlipidemia LDL goal < 70   . Bradycardia   . Ischemic cardiomyopathy   . Hyperlipidemia 06/11/2012  . Cough 06/11/2012      Current Outpatient Prescriptions  Medication Sig Dispense Refill  . amLODipine (NORVASC) 2.5 MG tablet Take 1 tablet (2.5 mg total) by mouth daily. 30 tablet 6  . aspirin EC 81 MG tablet Take 81 mg by mouth daily.    Marland Kitchen atorvastatin (LIPITOR) 40 MG tablet TAKE ONE TABLET BY MOUTH ONCE DAILY 90 tablet 1  . losartan (COZAAR) 100 MG tablet TAKE ONE TABLET BY  MOUTH ONCE DAILY 90 tablet 1  . Multiple Vitamin (MULTIVITAMIN WITH MINERALS) TABS Take 1 tablet by mouth daily.    Marland Kitchen NITROSTAT 0.4 MG SL tablet DISSOLVE ONE TABLET UNDER THE TONGUE EVERY 5 MINUTES AS NEEDED FOR CHEST PAIN.  DO NOT EXCEED A TOTAL OF 3 DOSES IN 15 MINUTES 25 tablet 0  . omega-3 acid ethyl esters (LOVAZA) 1 G capsule Take 2 g by mouth 2 (two) times daily.     No current facility-administered medications for this visit.    Allergies:   Vicodin    Social History:  The patient  reports that he has never smoked. He does not have any smokeless tobacco history on file. He reports that he does not drink alcohol or use illicit drugs.   Family History:  The patient's family history includes ALS in his mother; Other in his father.    ROS:  Please see the history of present illness.    Patient denies fever, chills, headache, sweats, rash, change in vision, change in hearing, chest pain, cough, nausea or vomiting, urinary symptoms. All other systems are reviewed and are negative.    PHYSICAL EXAM: VS:  BP 132/60 mmHg  Pulse 50  Ht 5\' 10"  (1.778 m)  Wt 191 lb 12.8 oz (87 kg)  BMI 27.52 kg/m2 , Patient is oriented to person time and place. Affect is normal. Head is  atraumatic. Sclera and conjunctiva are normal. There is no jugulovenous distention. Lungs are clear. Respiratory effort is not labored. Cardiac exam reveals S1 and S2. Abdomen is soft. There is no peripheral edema. There are no musculoskeletal deformities. There are no skin rashes.  EKG:   EKG is not done today.   Recent Labs: No results found for requested labs within last 365 days.    Lipid Panel    Component Value Date/Time   CHOL 175 05/31/2012 0450   TRIG 61 05/31/2012 0450   HDL 45 05/31/2012 0450   CHOLHDL 3.9 05/31/2012 0450   VLDL 12 05/31/2012 0450   LDLCALC 118* 05/31/2012 0450      Wt Readings from Last 3 Encounters:  05/01/15 191 lb 12.8 oz (87 kg)  10/26/14 191 lb 9.6 oz (86.909 kg)  04/29/14  187 lb 12.8 oz (85.186 kg)      Current medicines are reviewed  The patient understands his medications.     ASSESSMENT AND PLAN:

## 2015-05-01 NOTE — Assessment & Plan Note (Signed)
Blood pressures controlled. No change in therapy. 

## 2015-05-01 NOTE — Patient Instructions (Signed)
**Note De-identified Dagny Fiorentino Obfuscation** Medication Instructions:  Same-no change  Labwork: None  Testing/Procedures: None  Follow-Up: Your physician wants you to follow-up in: 6 months. You will receive a reminder letter in the mail two months in advance. If you don't receive a letter, please call our office to schedule the follow-up appointment.      

## 2015-05-01 NOTE — Assessment & Plan Note (Signed)
Initially his ejection fraction was in the range of 35% when he presented with his non-STEMI in July, 2013. Over time his ejection fraction improved. His most recent echo in January, 2015 revealed an EF of 50%. He will continue his current medications.

## 2015-05-01 NOTE — Assessment & Plan Note (Signed)
Patient is on guideline directed therapy.

## 2015-05-01 NOTE — Assessment & Plan Note (Signed)
Patient had a non-STEMI in July, 2013. He received a drug-eluting stent to the LAD. He had moderate other scattered disease. Ejection fraction at that time was 35%, but it improved to 50%. He is not having any significant symptoms. He needs no further workup at this time.

## 2015-05-29 ENCOUNTER — Other Ambulatory Visit: Payer: Self-pay | Admitting: Cardiology

## 2015-07-03 ENCOUNTER — Other Ambulatory Visit: Payer: Self-pay | Admitting: Cardiology

## 2015-07-06 ENCOUNTER — Encounter: Payer: Self-pay | Admitting: Cardiology

## 2015-07-12 ENCOUNTER — Other Ambulatory Visit: Payer: Self-pay | Admitting: Adult Health

## 2015-10-01 ENCOUNTER — Other Ambulatory Visit: Payer: Self-pay | Admitting: Cardiology

## 2015-11-24 ENCOUNTER — Ambulatory Visit (INDEPENDENT_AMBULATORY_CARE_PROVIDER_SITE_OTHER): Payer: Medicare HMO | Admitting: Cardiology

## 2015-11-24 ENCOUNTER — Encounter: Payer: Self-pay | Admitting: Cardiology

## 2015-11-24 VITALS — BP 142/74 | HR 48 | Ht 70.0 in | Wt 198.4 lb

## 2015-11-24 DIAGNOSIS — I255 Ischemic cardiomyopathy: Secondary | ICD-10-CM

## 2015-11-24 DIAGNOSIS — I2510 Atherosclerotic heart disease of native coronary artery without angina pectoris: Secondary | ICD-10-CM

## 2015-11-24 DIAGNOSIS — E785 Hyperlipidemia, unspecified: Secondary | ICD-10-CM | POA: Diagnosis not present

## 2015-11-24 DIAGNOSIS — I10 Essential (primary) hypertension: Secondary | ICD-10-CM

## 2015-11-24 DIAGNOSIS — R001 Bradycardia, unspecified: Secondary | ICD-10-CM

## 2015-11-24 DIAGNOSIS — I2583 Coronary atherosclerosis due to lipid rich plaque: Principal | ICD-10-CM

## 2015-11-24 NOTE — Patient Instructions (Signed)

## 2015-11-24 NOTE — Progress Notes (Signed)
Cardiology Office Note   Date:  11/24/2015   ID:  Samuel Walls, DOB 10/31/1941, MRN [id number]  PCP:  Nicoletta Dress, MD  Cardiologist:   Candee Furbish, MD (former Ron Parker)      History of Present Illness: Samuel Walls is a 75 y.o. male who presents for follow up coronary disease. He is actually doing well. He has sinus bradycardia that is asymptomatic. We are not able to use any beta blockers. He had left ventricular dysfunction in the past. Fortunately this improved over time with an ARB. He is not having any significant chest pain or shortness of breath. His most recent echo showed improvement of his ejection fraction up to 50% .   overall doing well without any syncopal episodes, does not seem to be affected by his chronic bradycardia. No chest pain, no fevers, no chills , no bleeding.   He is from [place]   Past Medical History  Diagnosis Date  . CAD (coronary artery disease)   . Hyperlipidemia LDL goal < 70   . Bradycardia   . Ischemic cardiomyopathy   . Hyperlipidemia 06/11/2012    Past Surgical History  Procedure Laterality Date  . Ankle closed reduction    . Cardiac catheterization  05/30/12  . Left heart catheterization with coronary angiogram N/A 05/31/2012    Procedure: LEFT HEART CATHETERIZATION WITH CORONARY ANGIOGRAM;  Surgeon: Sherren Mocha, MD;  Location: [facility] CATH LAB;  Service: Cardiovascular;  Laterality: N/A;     Current Outpatient Prescriptions  Medication Sig Dispense Refill  . amLODipine (NORVASC) 2.5 MG tablet TAKE ONE TABLET BY MOUTH ONCE DAILY 30 tablet 5  . aspirin EC 81 MG tablet Take 81 mg by mouth daily.    Marland Kitchen atorvastatin (LIPITOR) 40 MG tablet TAKE ONE TABLET BY MOUTH ONCE DAILY 90 tablet 3  . losartan (COZAAR) 100 MG tablet TAKE ONE TABLET BY MOUTH ONCE DAILY 90 tablet 1  . Multiple Vitamin (MULTIVITAMIN WITH MINERALS) TABS Take 1 tablet by mouth daily.    Marland Kitchen NITROSTAT 0.4 MG SL tablet DISSOLVE ONE TABLET UNDER THE TONGUE EVERY 5 MINUTES  AS NEEDED FOR CHEST PAIN.  DO NOT EXCEED A TOTAL OF 3 DOSES IN 15 MINUTES 25 tablet 5  . omega-3 acid ethyl esters (LOVAZA) 1 G capsule Take 2 g by mouth 2 (two) times daily.     No current facility-administered medications for this visit.    Allergies:   Vicodin    Social History:  The patient  reports that he has never smoked. He does not have any smokeless tobacco history on file. He reports that he does not drink alcohol or use illicit drugs.   Family History:  The patient's family history includes ALS in his mother; Other in his father.    ROS:  Please see the history of present illness.   Otherwise, review of systems are positive for none.   All other systems are reviewed and negative.    PHYSICAL EXAM: VS:  BP 142/74 mmHg  Pulse 48  Ht 5\' 10"  (1.778 m)  Wt 198 lb 6.4 oz (89.994 kg)  BMI 28.47 kg/m2 , BMI Body mass index is 28.47 kg/(m^2). GEN: Well nourished, well developed, in no acute distress HEENT: normal Neck: no JVD, carotid bruits, or masses Cardiac: RRR; no murmurs, rubs, or gallops,no edema  Respiratory:  clear to auscultation bilaterally, normal work of breathing GI: soft, nontender, nondistended, + BS MS: no deformity or atrophy Skin: warm and dry, no rash Neuro:  Strength and sensation are intact Psych: euthymic mood, full affect   EKG:  Today 11/24/15:  Sinus pericardial rate 46, old anterior lateral infarct pattern , nonspecific T wave changes. Personally viewed. Previous on 10/26/14 showed sinus bradycardia with PVC4   Recent Labs: No results found for requested labs within last 365 days.    Lipid Panel    Component Value Date/Time   CHOL 175 05/31/2012 0450   TRIG 61 05/31/2012 0450   HDL 45 05/31/2012 0450   CHOLHDL 3.9 05/31/2012 0450   VLDL 12 05/31/2012 0450   LDLCALC 118* 05/31/2012 0450      Wt Readings from Last 3 Encounters:  11/24/15 198 lb 6.4 oz (89.994 kg)  05/01/15 191 lb 12.8 oz (87 kg)  10/26/14 191 lb 9.6 oz (86.909 kg)        Other studies Reviewed: Additional studies/ records that were reviewed today include:  Prior office visit reviewed , lab work, EKG. 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 DES to LAD.  -  Moderate scattered disease elsewhere.  -  EF has normalized from prior of 35%.  -  No anginal symptoms.   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 Reassurance.  Current medicines are reviewed at length with the patient today.  The patient does not have concerns regarding medicines.  The following changes have been made:  no change  Labs/ tests ordered today include: none  No orders of the defined types were placed in this encounter.     Disposition:   FU with [facility] in 6 months Signed, Candee Furbish, MD  11/24/2015 10:47 AM    [facility] [place], [place], [place]  [address] Phone: [phone]; Fax: [phone]

## 2016-01-10 ENCOUNTER — Other Ambulatory Visit: Payer: Self-pay | Admitting: Cardiology

## 2016-04-17 ENCOUNTER — Other Ambulatory Visit: Payer: Self-pay | Admitting: Cardiology

## 2016-06-14 ENCOUNTER — Other Ambulatory Visit: Payer: Self-pay | Admitting: Cardiology

## 2016-06-18 ENCOUNTER — Ambulatory Visit (INDEPENDENT_AMBULATORY_CARE_PROVIDER_SITE_OTHER): Payer: Medicare HMO | Admitting: Cardiology

## 2016-06-18 ENCOUNTER — Encounter (INDEPENDENT_AMBULATORY_CARE_PROVIDER_SITE_OTHER): Payer: Self-pay

## 2016-06-18 ENCOUNTER — Encounter: Payer: Self-pay | Admitting: Cardiology

## 2016-06-18 VITALS — BP 138/82 | HR 46 | Ht 70.0 in | Wt 199.4 lb

## 2016-06-18 DIAGNOSIS — E785 Hyperlipidemia, unspecified: Secondary | ICD-10-CM | POA: Diagnosis not present

## 2016-06-18 DIAGNOSIS — I10 Essential (primary) hypertension: Secondary | ICD-10-CM

## 2016-06-18 DIAGNOSIS — I2510 Atherosclerotic heart disease of native coronary artery without angina pectoris: Secondary | ICD-10-CM | POA: Diagnosis not present

## 2016-06-18 DIAGNOSIS — I2583 Coronary atherosclerosis due to lipid rich plaque: Principal | ICD-10-CM

## 2016-06-18 DIAGNOSIS — R001 Bradycardia, unspecified: Secondary | ICD-10-CM

## 2016-06-18 MED ORDER — NITROGLYCERIN 0.4 MG SL SUBL: SUBLINGUAL_TABLET | SUBLINGUAL | 6 refills | Status: DC

## 2016-06-18 NOTE — Patient Instructions (Signed)

## 2016-06-18 NOTE — Progress Notes (Signed)
Cardiology Office Note   Date:  06/18/2016   ID:  Samuel Walls, DOB 14-Jun-1941, MRN [id number]  PCP:  Nicoletta Dress, MD  Cardiologist:   Candee Furbish, MD (former Ron Parker)      History of Present Illness: Samuel Walls is a 75 y.o. male who presents for follow up coronary disease. He is doing well. He has sinus bradycardia that is asymptomatic. We are not able to use any beta blockers. He had left ventricular dysfunction in the past. Fortunately this improved over time with an ARB. He is not having any significant chest pain or shortness of breath. His most recent echo showed improvement of his ejection fraction up to 50% .   No syncopal episodes, does not seem to be affected by his chronic bradycardia. No chest pain, no fevers, no chills , no bleeding.  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   He is from [place]   Past Medical History:  Diagnosis Date  . Bradycardia   . CAD (coronary artery disease)   . Hyperlipidemia 06/11/2012  . Hyperlipidemia LDL goal < 70   . Ischemic cardiomyopathy     Past Surgical History:  Procedure Laterality Date  . ANKLE CLOSED REDUCTION    . CARDIAC CATHETERIZATION  05/30/12  . LEFT HEART CATHETERIZATION WITH CORONARY ANGIOGRAM N/A 05/31/2012   Procedure: LEFT HEART CATHETERIZATION WITH CORONARY ANGIOGRAM;  Surgeon: Sherren Mocha, MD;  Location: [facility] CATH LAB;  Service: Cardiovascular;  Laterality: N/A;     Current Outpatient Prescriptions  Medication Sig Dispense Refill  . amLODipine (NORVASC) 2.5 MG tablet TAKE ONE TABLET BY MOUTH ONCE DAILY 30 tablet 6  . aspirin EC 81 MG tablet Take 81 mg by mouth daily.    Marland Kitchen atorvastatin (LIPITOR) 40 MG tablet TAKE ONE TABLET BY MOUTH ONCE DAILY 90 tablet 1  . losartan (COZAAR) 100 MG tablet TAKE ONE TABLET BY MOUTH ONCE DAILY 90 tablet 3  . Multiple Vitamin (MULTIVITAMIN WITH MINERALS) TABS Take 1 tablet by mouth daily.    . nitroGLYCERIN (NITROSTAT) 0.4 MG SL tablet DISSOLVE ONE  TABLET UNDER THE TONGUE EVERY 5 MINUTES AS NEEDED FOR CHEST PAIN.  DO NOT EXCEED A TOTAL OF 3 DOSES IN 15 MINUTES 25 tablet 6  . omega-3 acid ethyl esters (LOVAZA) 1 G capsule Take 2 g by mouth 2 (two) times daily.     No current facility-administered medications for this visit.     Allergies:   Vicodin [hydrocodone-acetaminophen]    Social History:  The patient  reports that he has never smoked. He does not have any smokeless tobacco history on file. He reports that he does not drink alcohol or use drugs.   Family History:  The patient's family history includes ALS in his mother; Other in his father.    ROS:  Please see the history of present illness.   Otherwise, review of systems are positive for none.   All other systems are reviewed and negative.    PHYSICAL EXAM: VS:  BP 138/82   Pulse (!) 46   Ht 5\' 10"  (1.778 m)   Wt 199 lb 6.4 oz (90.4 kg)   BMI 28.61 kg/m  , BMI Body mass index is 28.61 kg/m. GEN: Well nourished, well developed, in no acute distress  HEENT: normal  Neck: no JVD, carotid bruits, or masses Cardiac: Bradycardic regular; no murmurs, rubs, or gallops,no edema  Respiratory:  clear to auscultation bilaterally, normal work of breathing GI: soft, nontender, nondistended, +  BS MS: no deformity or atrophy  Skin: warm and dry, no rash Neuro:  Strength and sensation are intact Psych: euthymic mood, full affect   EKG:  Today 11/24/15:  Sinus pericardial rate 46, old anterior lateral infarct pattern , nonspecific T wave changes. Personally viewed. Previous on 10/26/14 showed sinus bradycardia with PVC4   Recent Labs: No results found for requested labs within last 8760 hours.    Lipid Panel    Component Value Date/Time   CHOL 175 05/31/2012 0450   TRIG 61 05/31/2012 0450   HDL 45 05/31/2012 0450   CHOLHDL 3.9 05/31/2012 0450   VLDL 12 05/31/2012 0450   LDLCALC 118 (H) 05/31/2012 0450      Wt Readings from Last 3 Encounters:  06/18/16 199 lb 6.4 oz  (90.4 kg)  11/24/15 198 lb 6.4 oz (90 kg)  05/01/15 191 lb 12.8 oz (87 kg)      Other studies Reviewed: Additional studies/ records that were reviewed today include:  Prior office visit reviewed , lab work, EKG. Review of the above records demonstrates:  As above   ASSESSMENT AND PLAN:  Bradycardia  -  Asymptomatic sinus bradycardia, no beta blockers , left ventricular function improved overtime without use of beta blocker. Stable. No change.   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 DES to LAD.  -  Moderate scattered disease elsewhere.  -  EF has normalized from prior of 35%.  -  No anginal symptoms.   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 Reassurance.  - Continuing with low-dose losartan. Not on beta blocker because of previous bradycardia.  Current medicines are reviewed at length with the patient today.  The patient does not have concerns regarding medicines.  The following changes have been made:  no change  Labs/ tests ordered today include: none  No orders of the defined types were placed in this encounter.    Disposition:   FU with Yaretzy Olazabal in 6 months Signed, Candee Furbish, MD  06/18/2016 2:40 PM    [facility] [place], [place], [place]  [address] Phone: [phone]; Fax: [phone]

## 2016-07-03 ENCOUNTER — Encounter: Payer: Self-pay | Admitting: Cardiology

## 2016-11-01 ENCOUNTER — Encounter: Payer: Self-pay | Admitting: Radiation Oncology

## 2016-11-14 ENCOUNTER — Other Ambulatory Visit: Payer: Self-pay | Admitting: Cardiology

## 2016-11-19 NOTE — Progress Notes (Signed)
GU Location of Tumor / Histology: prostate cancer  If Prostate Cancer, Gleason Score is (4 + 3) and PSA is (4.9)  Samuel Walls presented to Dr. Nila Nephew on 10/30/2010 with an elevated PSA.  Biopsies of prostate (if applicable) revealed:    Past/Anticipated interventions by urology, if any: biopsy, staging CT (no evidence of mets),  referral to Dr. Tammi Klippel for consideration of seeds.  Past/Anticipated interventions by medical oncology, if any: No  Weight changes, if any: no  Bowel/Bladder complaints, if any: IPSS 14 with primary complaint of weak stream. Also, reports nocturia x 2. Denies dysuria, hematuria, incontinence or leakage. Reports occasional constipation.   Nausea/Vomiting, if any: no  Pain issues, if any:  no  SAFETY ISSUES:  Prior radiation? no  Pacemaker/ICD? no  Possible current pregnancy? no  Is the patient on methotrexate? no  Current Complaints / other details:  76 year old male. Married. NKDA. Prostate volume: 44.1 cc

## 2016-11-20 ENCOUNTER — Encounter: Payer: Self-pay | Admitting: Radiation Oncology

## 2016-11-20 ENCOUNTER — Ambulatory Visit
Admission: RE | Admit: 2016-11-20 | Discharge: 2016-11-20 | Disposition: A | Discharge status: Home / Self Care | Payer: PPO | Source: Ambulatory Visit | Attending: Radiation Oncology | Admitting: Radiation Oncology

## 2016-11-20 VITALS — BP 155/84 | HR 45 | Resp 18 | Ht 70.0 in | Wt 202.6 lb

## 2016-11-20 DIAGNOSIS — C61 Malignant neoplasm of prostate: Secondary | ICD-10-CM | POA: Diagnosis not present

## 2016-11-20 DIAGNOSIS — Z79899 Other long term (current) drug therapy: Secondary | ICD-10-CM | POA: Diagnosis not present

## 2016-11-20 DIAGNOSIS — R972 Elevated prostate specific antigen [PSA]: Secondary | ICD-10-CM | POA: Diagnosis not present

## 2016-11-20 DIAGNOSIS — Z885 Allergy status to narcotic agent status: Secondary | ICD-10-CM | POA: Insufficient documentation

## 2016-11-20 DIAGNOSIS — E785 Hyperlipidemia, unspecified: Secondary | ICD-10-CM | POA: Insufficient documentation

## 2016-11-20 DIAGNOSIS — I2510 Atherosclerotic heart disease of native coronary artery without angina pectoris: Secondary | ICD-10-CM | POA: Diagnosis not present

## 2016-11-20 DIAGNOSIS — Z7982 Long term (current) use of aspirin: Secondary | ICD-10-CM | POA: Diagnosis not present

## 2016-11-20 HISTORY — DX: Malignant neoplasm of prostate: C61

## 2016-11-20 NOTE — Progress Notes (Signed)
Radiation Oncology         [phone]) [phone] ________________________________  Initial Outpatient Consultation  Name: Samuel Walls MRN: [id number]  Date: 11/20/2016  DOB: 1941/08/04  [id number] E, MD  Samuel Bimler, MD   REFERRING PHYSICIAN: Joie Bimler, MD  DIAGNOSIS: 76 y.o. 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 Malignant neoplasm of prostate ([place]) [place] [name] is a 76 y.o. gentleman.  He was noted to have an elevated PSA by his primary care physician.  The patient saw Dr. Nila Walls on 08/15/15 for a rising PSA of 3.1 in January 2014 to 4.1 in August 2016. Digital rectal examination at that time revealed a normal prostate measuring about 30 gm, but smooth-mild firmness of the right lateral border. The patient proceeded to transrectal ultrasound with 12 biopsies of the prostate on 08/31/15. Out of 12 core biopsies, none were positive. The right lateral apex, right lateral mid, right lateral base, right apex, right mid, and right base were noted for atypical glands where the differential diagnosis was between high grade prostatic intraepithelial neoplasia and intraductal spread of carcinoma.  The plan afterwards was to monitor and start finasteride.  The patient returned to Dr. Nila Walls on 08/07/16 with a rising PSA of 4.9 in August 2017. Digital rectal exam at the time revealed a normal, smooth, prostate measuring ~30-40 gm. The patient proceeded to transrectal ultrasound with 9 biopsies of the prostate on 08/28/16. The prostate volume measured 44.1 cc.  Out of 9 core biopsies, 1 was positive. The maximum Gleason score was 3+4=7, and this was seen in right apex.  On 09/25/16, CT of the abd/pelvis was performed as part of his workup. No masses or adenopathy was identified, but prostate gland enlargement was noted.  The patient reviewed the biopsy results with his  urologist and he has kindly been referred today for discussion of potential radiation treatment options. The patient's wife was also present during this encounter.   PSA 12/10/12: 3.1 12/24/2013: 3.8 12/29/2014: 4.3 07/04/15: 4.1 01/04/16: 3.7 07/03/16: 4.9  PREVIOUS RADIATION THERAPY: No  PAST MEDICAL HISTORY:  has a past medical history of Bradycardia; CAD (coronary artery disease); Hyperlipidemia (06/11/2012); Hyperlipidemia LDL goal < 70; Ischemic cardiomyopathy; and Prostate cancer ([place]).    PAST SURGICAL HISTORY: Past Surgical History:  Procedure Laterality Date  . ANKLE CLOSED REDUCTION    . CARDIAC CATHETERIZATION  05/30/12  . LEFT HEART CATHETERIZATION WITH CORONARY ANGIOGRAM N/A 05/31/2012   Procedure: LEFT HEART CATHETERIZATION WITH CORONARY ANGIOGRAM;  Surgeon: Sherren Mocha, MD;  Location: [facility] CATH LAB;  Service: Cardiovascular;  Laterality: N/A;  . PROSTATE BIOPSY      FAMILY HISTORY: family history includes ALS in his mother; Other in his father.  SOCIAL HISTORY:  reports that he has never smoked. He has never used smokeless tobacco. He reports that he does not drink alcohol or use drugs.  ALLERGIES: Vicodin [hydrocodone-acetaminophen]  MEDICATIONS:  Current Outpatient Prescriptions  Medication Sig Dispense Refill  . amLODipine (NORVASC) 2.5 MG tablet Take 1 tablet (2.5 mg total) by mouth daily. 90 tablet 3  . aspirin EC 81 MG tablet Take 81 mg by mouth daily.    Marland Kitchen atorvastatin (LIPITOR) 40 MG tablet TAKE ONE TABLET BY MOUTH ONCE DAILY 90 tablet 1  . losartan (COZAAR) 100 MG tablet TAKE ONE TABLET BY MOUTH ONCE DAILY 90 tablet 3  .  Multiple Vitamin (MULTIVITAMIN WITH MINERALS) TABS Take 1 tablet by mouth daily.    . nitroGLYCERIN (NITROSTAT) 0.4 MG SL tablet DISSOLVE ONE TABLET UNDER THE TONGUE EVERY 5 MINUTES AS NEEDED FOR CHEST PAIN.  DO NOT EXCEED A TOTAL OF 3 DOSES IN 15 MINUTES 25 tablet 6  . omega-3 acid ethyl esters (LOVAZA) 1 G capsule Take 2 g by mouth 2 (two)  times daily.     No current facility-administered medications for this encounter.     REVIEW OF SYSTEMS:  A 15 point review of systems is documented in the electronic medical record. This was obtained by the nursing staff. However, I reviewed this with the patient to discuss relevant findings and make appropriate changes.  Pertinent items noted in HPI and remainder of comprehensive ROS otherwise negative..  The patient completed an IPSS and IIEF questionnaire.  His IPSS score was 14 indicating moderate urinary outflow obstructive symptoms; such as weak stream, and nocturia x2.  He indicated that his erectile function is unable to complete sexual activity.   PHYSICAL EXAM: This patient is in no acute distress.  He is alert and oriented.   height is 5\' 10"  (1.778 m) and weight is 202 lb 9.6 oz (91.9 kg). His blood pressure is 155/84 (abnormal) and his pulse is 45 (abnormal). His respiration is 18 and oxygen saturation is 99%.  He exhibits no respiratory distress or labored breathing.  He appears neurologically intact.  His mood is pleasant.  His affect is appropriate.  Please note the digital rectal exam findings described above.  KPS = 100  100 - Normal; no complaints; no evidence of disease. 90   - Able to carry on normal activity; minor signs or symptoms of disease. 80   - Normal activity with effort; some signs or symptoms of disease. 64   - Cares for self; unable to carry on normal activity or to do active work. 60   - Requires occasional assistance, but is able to care for most of his personal needs. 50   - Requires considerable assistance and frequent medical care. 81   - Disabled; requires special care and assistance. 54   - Severely disabled; hospital admission is indicated although death not imminent. 6   - Very sick; hospital admission necessary; active supportive treatment necessary. 10   - Moribund; fatal processes progressing rapidly. 0     - Dead  Karnofsky DA, Abelmann [place],  Craver LS and Burchenal [facility] [phone]) The use of the nitrogen mustards in the palliative treatment of carcinoma: with particular reference to bronchogenic carcinoma Cancer 1 [phone]   LABORATORY DATA:  Lab Results  Component Value Date   WBC 14.5 (H) 06/01/2012   HGB 13.0 06/01/2012   HCT 38.5 (L) 06/01/2012   MCV 90.0 06/01/2012   PLT 181 06/01/2012   Lab Results  Component Value Date   NA 140 06/01/2012   K 3.9 06/01/2012   CL 105 06/01/2012   CO2 27 06/01/2012   Lab Results  Component Value Date   ALT 26 05/30/2012   AST 37 05/30/2012   ALKPHOS 126 (H) 05/30/2012   BILITOT 0.4 05/30/2012     RADIOGRAPHY: No results found.    IMPRESSION: This gentleman is a 76 y.o. 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  His T-Stage, Gleason's Score, and PSA put him into the intermediate risk group.  Accordingly he is eligible for a variety of  potential treatment options including external beam radiation or radioactive seed implant.    He falls into a select sub-set of patients with intermediate risk disease who are eligible for seed implant with primary Gleason grade of 3 and less than half of one lobe positive for Gleason's 7 disease.   PLAN: Today I reviewed the findings and workup thus far.  We discussed the natural history of prostate cancer.  We reviewed the the implications of T-stage, Gleason's Score, and PSA on decision-making and outcomes in prostate cancer.  We discussed radiation treatment in the management of prostate cancer with regard to the logistics and delivery of external beam radiation treatment as well as the logistics and delivery of prostate brachytherapy.  We compared and contrasted each of these approaches and also compared these against prostatectomy.  The patient expressed interest in prostate brachytherapy.  The patient would like to proceed with prostate brachytherapy.  I will share my findings with Dr. Nila Walls  and move forward with scheduling the procedure in the near future.     I enjoyed meeting with him today, and will look forward to participating in the care of this very nice gentleman.  I spent 55 minutes face to face with the patient and more than 50% of that time was spent in counseling and/or coordination of care.   ------------------------------------------------  Sheral Apley. Tammi Klippel, M.D.  This document serves as a record of services personally performed by Tyler Pita, MD. It was created on his behalf by Darcus Austin, a trained medical scribe. The creation of this record is based on the scribe's personal observations and the provider's statements to them. This document has been checked and approved by the attending provider.

## 2016-11-20 NOTE — Progress Notes (Signed)
  Radiation Oncology         [phone] ________________________________  Name: Samuel Walls MRN: [id number]  Date: 11/20/2016  DOB: 1941-07-01  SIMULATION AND TREATMENT PLANNING NOTE PUBIC ARCH STUDY  [id number] E, MD  No ref. provider found  DIAGNOSIS: 76 y.o. 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 He was brought to the radiation planning suite and placed supine on the CT couch. A 3-dimensional image study set was obtained in upload to the planning computer. There, on each axial slice, I contoured the prostate gland. Then, using three-dimensional radiation planning tools I reconstructed the prostate in view of the structures from the transperineal needle pathway to assess for possible pubic arch interference. In doing so, I did not appreciate any pubic arch interference. Also, the patient's prostate volume was estimated based on the drawn structure. The volume was 60 cc.  Given the pubic arch appearance and prostate volume, patient remains a good candidate to proceed with prostate seed implant. Today, he freely provided informed written consent to proceed.    PLAN: The patient will undergo prostate seed implant.   ________________________________  Sheral Apley. Tammi Klippel, M.D.

## 2016-11-20 NOTE — Progress Notes (Signed)
See progress note under physician encounter. 

## 2016-11-22 ENCOUNTER — Telehealth: Payer: Self-pay | Admitting: *Deleted

## 2016-11-22 NOTE — Telephone Encounter (Signed)
Called patient to inform implant date for 01-02-17 @ 2 pm, spoke with patient's wife- Pamala Hurry and she is aware of this implant date.

## 2016-11-25 ENCOUNTER — Ambulatory Visit: Payer: Medicare HMO | Admitting: Radiation Oncology

## 2016-11-25 ENCOUNTER — Ambulatory Visit: Payer: Medicare HMO

## 2016-12-02 DIAGNOSIS — R351 Nocturia: Secondary | ICD-10-CM | POA: Diagnosis not present

## 2016-12-02 DIAGNOSIS — C61 Malignant neoplasm of prostate: Secondary | ICD-10-CM | POA: Diagnosis not present

## 2016-12-19 ENCOUNTER — Other Ambulatory Visit: Payer: Self-pay | Admitting: Cardiology

## 2016-12-19 NOTE — Telephone Encounter (Signed)
Rx refill sent to pharmacy. 

## 2016-12-23 DIAGNOSIS — C61 Malignant neoplasm of prostate: Secondary | ICD-10-CM | POA: Diagnosis not present

## 2016-12-23 DIAGNOSIS — R351 Nocturia: Secondary | ICD-10-CM | POA: Diagnosis not present

## 2016-12-24 NOTE — H&P (Signed)
NAME:  GARL, KRIZEK.:  [id number]  MEDICAL RECORD NO.:  [id number]  LOCATION:                                 FACILITY:  PHYSICIAN:  Joie Bimler, M.D.     DATE OF BIRTH:  09-26-1941  DATE OF ADMISSION: DATE OF DISCHARGE:                             HISTORY & PHYSICAL   PLANNED PROCEDURE:  January 02, 2017.  DIAGNOSIS:  Stage T1c prostate cancer.  HISTORY OF PRESENT ILLNESS:  Mr. Tobiason is a 76 year old gentleman with a history of an elevated PSA.  His last PSA was done in August, 2017 and was 4.9.  He had a previous prostate biopsy which was negative, but he underwent repeat prostate biopsy on October 2017, and out of 9 biopsy cores, 1 was positive at the right apex.  The Gleason score was 3 + 4 equals 7.  He underwent staging CT scan of the abdomen and pelvis and was not found to have any metastasis.  He has reviewed his treatment options and presents today for brachytherapy.  PAST MEDICAL HISTORY:  Significant for bradycardia, coronary artery disease, including a cardiac catheterizations in 2013.  He has a history of hyperlipidemia.  MEDICATIONS:  His usual medications include amlodipine 2.5 mg daily, Lipitor 40 mg daily, losartan 100 mg daily, multivitamin, aspirin 81 mg daily, and nitroglycerin 0.4 mg p.r.n.  SOCIAL HISTORY:  He does not smoke nor drink alcohol.  FAMILY HISTORY:  His mother had ALS.  REVIEW OF SYSTEMS:  No history of chest pain, shortness of breath, headaches.  Bowel movements are regular.  PHYSICAL EXAMINATION:  GENERAL: A well-developed, well-nourished man, in no acute distress. HEAD:  Normocephalic. EYES:  Equally round and reactive to light. NECK:  Without masses. CHEST:  The lungs sounds are clear to auscultation. HEART:  Sounds revealed bradycardia with no murmurs heard. ABDOMEN:  Nontender, without masses. GU:  The penis is without lesions.  Both testes descended without masses. RECTAL:  Reveals a 30-40 g  prostate, smooth, mild prominence on the right.  Adequate sphincter tone. EXTREMITIES:  Lower extremities are without calf tenderness nor edema. NEUROLOGIC:  Nonfocal.  PERTINENT LABORATORIES:  Include a PSA of 4.9 from August 2017.  Other laboratories are pending.  IMPRESSION:  Stage T1c prostate adenocarcinoma, Gleason 7.  PLAN:  The patient has been presented his options and has decided on definitive therapy with radioactive seed implantation.  At this time, we will proceed with that.    ______________________________ Joie Bimler, M.D.   ______________________________ Joie Bimler, M.D.    RC/MEDQ  D:  12/23/2016  T:  12/23/2016  Job:  [id number]

## 2016-12-25 ENCOUNTER — Ambulatory Visit: Admission: RE | Admit: 2016-12-25 | Payer: PPO | Source: Ambulatory Visit

## 2016-12-30 ENCOUNTER — Encounter (HOSPITAL_BASED_OUTPATIENT_CLINIC_OR_DEPARTMENT_OTHER): Payer: Self-pay | Admitting: *Deleted

## 2016-12-30 DIAGNOSIS — Z7901 Long term (current) use of anticoagulants: Secondary | ICD-10-CM | POA: Diagnosis not present

## 2016-12-30 DIAGNOSIS — E119 Type 2 diabetes mellitus without complications: Secondary | ICD-10-CM | POA: Diagnosis not present

## 2016-12-30 DIAGNOSIS — Z01812 Encounter for preprocedural laboratory examination: Secondary | ICD-10-CM | POA: Diagnosis not present

## 2016-12-30 NOTE — Progress Notes (Signed)
NPO AFTER MN.  ARRIVE AT 1130.  NEEDS EKG.  CURRENT CXR IN  CHART AND EPIC.  PER PT LAB WORK DONE AT DR [facility] OFFICE LAST WEEK,  CALLED AND LM FOR OR SCHEDULER, DEBORAH TO FAX RESULTS.  WILL TAKE COZAAR, NORVASC, AND LIPITOR AM DOS W/ SIPS OF WATER AND DO FLEET ENEMA.

## 2017-01-01 ENCOUNTER — Telehealth: Payer: Self-pay | Admitting: *Deleted

## 2017-01-01 DIAGNOSIS — C61 Malignant neoplasm of prostate: Secondary | ICD-10-CM | POA: Diagnosis not present

## 2017-01-01 NOTE — Telephone Encounter (Signed)
CALLED PATIENT TO REMIND OF IMPLANT FOR 01-02-17, SPOKE WITH PATIENT'S WIFE BARBARA AND SHE IS AWARE OF THIS PROCEDURE

## 2017-01-02 ENCOUNTER — Encounter (HOSPITAL_BASED_OUTPATIENT_CLINIC_OR_DEPARTMENT_OTHER): Payer: Self-pay | Admitting: *Deleted

## 2017-01-02 ENCOUNTER — Ambulatory Visit (HOSPITAL_BASED_OUTPATIENT_CLINIC_OR_DEPARTMENT_OTHER): Payer: PPO | Admitting: Anesthesiology

## 2017-01-02 ENCOUNTER — Encounter (HOSPITAL_BASED_OUTPATIENT_CLINIC_OR_DEPARTMENT_OTHER)
Admission: RE | Disposition: A | Discharge status: Home / Self Care | Payer: Self-pay | Source: Ambulatory Visit | Attending: Urology

## 2017-01-02 ENCOUNTER — Ambulatory Visit (HOSPITAL_COMMUNITY): Payer: PPO

## 2017-01-02 ENCOUNTER — Ambulatory Visit (HOSPITAL_BASED_OUTPATIENT_CLINIC_OR_DEPARTMENT_OTHER)
Admission: RE | Admit: 2017-01-02 | Discharge: 2017-01-02 | Disposition: A | Discharge status: Home / Self Care | Payer: PPO | Source: Ambulatory Visit | Attending: Urology | Admitting: Urology

## 2017-01-02 DIAGNOSIS — I110 Hypertensive heart disease with heart failure: Secondary | ICD-10-CM | POA: Insufficient documentation

## 2017-01-02 DIAGNOSIS — E785 Hyperlipidemia, unspecified: Secondary | ICD-10-CM | POA: Diagnosis not present

## 2017-01-02 DIAGNOSIS — I10 Essential (primary) hypertension: Secondary | ICD-10-CM | POA: Diagnosis not present

## 2017-01-02 DIAGNOSIS — M1990 Unspecified osteoarthritis, unspecified site: Secondary | ICD-10-CM | POA: Diagnosis not present

## 2017-01-02 DIAGNOSIS — C61 Malignant neoplasm of prostate: Secondary | ICD-10-CM | POA: Insufficient documentation

## 2017-01-02 DIAGNOSIS — R001 Bradycardia, unspecified: Secondary | ICD-10-CM | POA: Diagnosis not present

## 2017-01-02 DIAGNOSIS — I2510 Atherosclerotic heart disease of native coronary artery without angina pectoris: Secondary | ICD-10-CM | POA: Insufficient documentation

## 2017-01-02 DIAGNOSIS — I509 Heart failure, unspecified: Secondary | ICD-10-CM | POA: Insufficient documentation

## 2017-01-02 DIAGNOSIS — Z7982 Long term (current) use of aspirin: Secondary | ICD-10-CM | POA: Insufficient documentation

## 2017-01-02 DIAGNOSIS — I25810 Atherosclerosis of coronary artery bypass graft(s) without angina pectoris: Secondary | ICD-10-CM | POA: Diagnosis not present

## 2017-01-02 HISTORY — DX: Presence of coronary angioplasty implant and graft: Z95.5

## 2017-01-02 HISTORY — DX: Bradycardia, unspecified: R00.1

## 2017-01-02 HISTORY — PX: RADIOACTIVE SEED IMPLANT: SHX5150

## 2017-01-02 HISTORY — DX: Old myocardial infarction: I25.2

## 2017-01-02 HISTORY — DX: Nocturia: R35.1

## 2017-01-02 HISTORY — DX: Presence of spectacles and contact lenses: Z97.3

## 2017-01-02 HISTORY — DX: Benign prostatic hyperplasia without lower urinary tract symptoms: N40.0

## 2017-01-02 SURGERY — INSERTION, RADIATION SOURCE, PROSTATE
Anesthesia: General

## 2017-01-02 MED ORDER — EPHEDRINE SULFATE-NACL 50-0.9 MG/10ML-% IV SOSY
PREFILLED_SYRINGE | INTRAVENOUS | Status: DC | PRN
  Administered 2017-01-02: 10 mg via INTRAVENOUS

## 2017-01-02 MED ORDER — ONDANSETRON HCL 4 MG/2ML IJ SOLN
INTRAMUSCULAR | Status: DC | PRN
  Administered 2017-01-02: 4 mg via INTRAVENOUS

## 2017-01-02 MED ORDER — IOHEXOL 300 MG/ML  SOLN
INTRAMUSCULAR | Status: DC | PRN
  Administered 2017-01-02: 5 mL via INTRAVENOUS

## 2017-01-02 MED ORDER — FENTANYL CITRATE (PF) 100 MCG/2ML IJ SOLN
INTRAMUSCULAR | Status: AC
  Filled 2017-01-02: qty 2

## 2017-01-02 MED ORDER — DEXAMETHASONE SODIUM PHOSPHATE 10 MG/ML IJ SOLN
INTRAMUSCULAR | Status: AC
  Filled 2017-01-02: qty 1

## 2017-01-02 MED ORDER — GLYCOPYRROLATE 0.2 MG/ML IJ SOLN
INTRAMUSCULAR | Status: DC | PRN
  Administered 2017-01-02: .2 mg via INTRAVENOUS
  Administered 2017-01-02: 0.2 mg via INTRAVENOUS

## 2017-01-02 MED ORDER — STERILE WATER FOR IRRIGATION IR SOLN
Status: DC | PRN
  Administered 2017-01-02: 1000 mL

## 2017-01-02 MED ORDER — CIPROFLOXACIN IN D5W 400 MG/200ML IV SOLN
INTRAVENOUS | Status: AC
  Filled 2017-01-02: qty 200

## 2017-01-02 MED ORDER — MIDAZOLAM HCL 5 MG/5ML IJ SOLN
INTRAMUSCULAR | Status: DC | PRN
  Administered 2017-01-02: 2 mg via INTRAVENOUS

## 2017-01-02 MED ORDER — SODIUM CHLORIDE 0.9 % IV SOLN
INTRAVENOUS | Status: DC | PRN
  Administered 2017-01-02: 15 mL via INTRAMUSCULAR

## 2017-01-02 MED ORDER — LACTATED RINGERS IV SOLN
INTRAVENOUS | Status: DC
  Administered 2017-01-02 (×2): via INTRAVENOUS
  Filled 2017-01-02: qty 1000

## 2017-01-02 MED ORDER — DEXAMETHASONE SODIUM PHOSPHATE 4 MG/ML IJ SOLN
INTRAMUSCULAR | Status: DC | PRN
  Administered 2017-01-02: 10 mg via INTRAVENOUS

## 2017-01-02 MED ORDER — FENTANYL CITRATE (PF) 100 MCG/2ML IJ SOLN
25.0000 ug | INTRAMUSCULAR | Status: DC | PRN
  Administered 2017-01-02: 50 ug via INTRAVENOUS
  Filled 2017-01-02: qty 1

## 2017-01-02 MED ORDER — CIPROFLOXACIN IN D5W 400 MG/200ML IV SOLN
400.0000 mg | INTRAVENOUS | Status: AC
  Administered 2017-01-02: 400 mg via INTRAVENOUS
  Filled 2017-01-02: qty 200

## 2017-01-02 MED ORDER — PROPOFOL 10 MG/ML IV BOLUS
INTRAVENOUS | Status: DC | PRN
  Administered 2017-01-02: 20 mg via INTRAVENOUS
  Administered 2017-01-02: 150 mg via INTRAVENOUS

## 2017-01-02 MED ORDER — FLEET ENEMA 7-19 GM/118ML RE ENEM
1.0000 | ENEMA | Freq: Once | RECTAL | Status: AC
  Administered 2017-01-02: 1 via RECTAL
  Filled 2017-01-02: qty 1

## 2017-01-02 MED ORDER — MEPERIDINE HCL 50 MG PO TABS: 50.0000 mg | ORAL_TABLET | Freq: Four times a day (QID) | ORAL | 0 refills | Status: AC | PRN

## 2017-01-02 MED ORDER — CIPROFLOXACIN HCL 250 MG PO TABS: 250.0000 mg | ORAL_TABLET | Freq: Two times a day (BID) | ORAL | 1 refills | Status: AC

## 2017-01-02 MED ORDER — SUGAMMADEX SODIUM 500 MG/5ML IV SOLN
INTRAVENOUS | Status: AC
  Filled 2017-01-02: qty 5

## 2017-01-02 MED ORDER — BELLADONNA ALKALOIDS-OPIUM 16.2-60 MG RE SUPP
RECTAL | Status: AC
  Filled 2017-01-02: qty 1

## 2017-01-02 MED ORDER — ONDANSETRON HCL 4 MG/2ML IJ SOLN
INTRAMUSCULAR | Status: AC
  Filled 2017-01-02: qty 2

## 2017-01-02 MED ORDER — BELLADONNA ALKALOIDS-OPIUM 16.2-60 MG RE SUPP
RECTAL | Status: DC | PRN
  Administered 2017-01-02: 1 via RECTAL

## 2017-01-02 MED ORDER — LIDOCAINE 2% (20 MG/ML) 5 ML SYRINGE
INTRAMUSCULAR | Status: DC | PRN
  Administered 2017-01-02: 100 mg via INTRAVENOUS

## 2017-01-02 MED ORDER — MIDAZOLAM HCL 2 MG/2ML IJ SOLN
INTRAMUSCULAR | Status: AC
  Filled 2017-01-02: qty 2

## 2017-01-02 MED ORDER — FENTANYL CITRATE (PF) 100 MCG/2ML IJ SOLN
INTRAMUSCULAR | Status: DC | PRN
Start: 2017-01-02 — End: 2017-01-02
  Administered 2017-01-02: 50 ug via INTRAVENOUS

## 2017-01-02 SURGICAL SUPPLY — 30 items
BLADE CLIPPER SURG (BLADE) ×2 IMPLANT
CATH FOLEY 2WAY SLVR  5CC 16FR (CATHETERS) ×2
CATH FOLEY 2WAY SLVR 5CC 16FR (CATHETERS) ×2 IMPLANT
CATH ROBINSON RED A/P 20FR (CATHETERS) ×2 IMPLANT
CLOTH BEACON ORANGE TIMEOUT ST (SAFETY) ×2 IMPLANT
COVER BACK TABLE 60X90IN (DRAPES) ×2 IMPLANT
COVER MAYO STAND STRL (DRAPES) ×2 IMPLANT
DRSG TEGADERM 4X4.75 (GAUZE/BANDAGES/DRESSINGS) ×2 IMPLANT
DRSG TEGADERM 8X12 (GAUZE/BANDAGES/DRESSINGS) ×2 IMPLANT
GAUZE SPONGE 4X4 12PLY STRL (GAUZE/BANDAGES/DRESSINGS) ×2 IMPLANT
GLOVE BIO SURGEON STRL SZ7.5 (GLOVE) IMPLANT
GLOVE BIOGEL PI IND STRL 7.5 (GLOVE) ×4 IMPLANT
GLOVE BIOGEL PI INDICATOR 7.5 (GLOVE) ×4
GLOVE ECLIPSE 8.0 STRL XLNG CF (GLOVE) ×8 IMPLANT
GLOVE SURG SIGNA 7.5 PF LTX (GLOVE) ×6 IMPLANT
GOWN STRL REUS W/ TWL LRG LVL3 (GOWN DISPOSABLE) ×1 IMPLANT
GOWN STRL REUS W/ TWL XL LVL3 (GOWN DISPOSABLE) ×1 IMPLANT
GOWN STRL REUS W/TWL LRG LVL3 (GOWN DISPOSABLE) ×1
GOWN STRL REUS W/TWL XL LVL3 (GOWN DISPOSABLE) ×1
HOLDER FOLEY CATH W/STRAP (MISCELLANEOUS) ×2 IMPLANT
IV NS 1000ML (IV SOLUTION) ×1
IV NS 1000ML BAXH (IV SOLUTION) ×1 IMPLANT
KIT ROOM TURNOVER WOR (KITS) ×2 IMPLANT
MANIFOLD NEPTUNE II (INSTRUMENTS) IMPLANT
PACK CYSTO (CUSTOM PROCEDURE TRAY) ×2 IMPLANT
SYRINGE 10CC LL (SYRINGE) ×2 IMPLANT
SelectSeed 125 ×2 IMPLANT
TUBE CONNECTING 12X1/4 (SUCTIONS) IMPLANT
UNDERPAD 30X30 INCONTINENT (UNDERPADS AND DIAPERS) ×4 IMPLANT
WATER STERILE IRR 500ML POUR (IV SOLUTION) ×2 IMPLANT

## 2017-01-02 NOTE — Progress Notes (Signed)
  Radiation Oncology         [phone] ________________________________  Name: Samuel Walls MRN: [id number]  Date: 01/02/2017  DOB: 1940/12/20       Prostate Seed Implant  OB:[id number] E, MD  No ref. provider found  DIAGNOSIS: 76 y.o. 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  RADIATION DOSE: 145 Gy, definitive therapy.  TECHNIQUE: [place] Amesquita was brought to the operating room with the urologist. He was placed in the dorsolithotomy position. He was catheterized and a rectal tube was inserted. The perineum was shaved, prepped and draped. The ultrasound probe was then introduced into the rectum to see the prostate gland.  TREATMENT DEVICE: A needle grid was attached to the ultrasound probe stand and anchor needles were placed.  3D PLANNING: The prostate was imaged in 3D using a sagittal sweep of the prostate probe. These images were transferred to the planning computer. There, the prostate, urethra and rectum were defined on each axial reconstructed image. Then, the software created an optimized 3D plan and a few seed positions were adjusted. The quality of the plan was reviewed using [facility] information for the target and the following two organs at risk:  Urethra and Rectum.  Then the accepted plan was uploaded to the seed Selectron afterloading unit.  PROSTATE VOLUME STUDY:  Using transrectal ultrasound the volume of the prostate was verified to be 65.5 cc.  SPECIAL TREATMENT PROCEDURE/SUPERVISION AND HANDLING: The Nucletron FIRST system was used to place the needles under sagittal guidance. A total of 24 needles were used to deposit 79 seeds in the prostate gland. The individual seed activity was 0.591 mCi.  COMPLEX SIMULATION: At the end of the procedure, an anterior radiograph of the pelvis was obtained to document seed positioning and count.  Cystoscopy was performed to check the urethra and bladder.  MICRODOSIMETRY: At the end of the procedure, the patient was emitting 0.09 mR/hr at 1 meter. Accordingly, he was considered safe for hospital discharge.  PLAN: The patient will return to the radiation oncology clinic for post implant CT dosimetry in three weeks.   ________________________________  Sheral Apley Tammi Klippel, M.D.

## 2017-01-02 NOTE — Brief Op Note (Signed)
01/02/2017  4:07 PM  PATIENT:  Samuel Walls  76 y.o. male  PRE-OPERATIVE DIAGNOSIS:  PROSTATE CANCER  POST-OPERATIVE DIAGNOSIS:  PROSTATE CANCER  PROCEDURE:  Procedure(s): RADIOACTIVE SEED IMPLANT/BRACHYTHERAPY IMPLANT (N/A)  SURGEON:  Surgeon(s) and Role:     Joie Bimler, MD - Primary     Tyler Pita, MD  PHYSICIAN ASSISTANT:   ASSISTANTS: none   ANESTHESIA:  general  EBL:  Total I/O In: 1200 [I.V.:1200] Out: 230 [Urine:225; Blood:5]  BLOOD ADMINISTERED:0  DRAINS: 16 Fr Foley  LOCAL MEDICATIONS USED: B and O supp  SPECIMEN:  no  DISPOSITION OF SPECIMEN:  no COUNTS: 79 seeds  TOURNIQUET: 0  DICTATION: No.  Haskell:[id number]  PLAN OF CARE: f/u in office  PATIENT DISPOSITION:  Home   Delay start of Pharmacological VTE agent (>24hrs) due to surgical blood loss or risk of bleeding: not applicable

## 2017-01-02 NOTE — Anesthesia Procedure Notes (Signed)
Procedure Name: LMA Insertion Date/Time: 01/02/2017 2:16 PM Performed by: Wanita Chamberlain Pre-anesthesia Checklist: Patient identified, Timeout performed, Emergency Drugs available, Suction available and Patient being monitored Patient Re-evaluated:Patient Re-evaluated prior to inductionOxygen Delivery Method: Circle system utilized Preoxygenation: Pre-oxygenation with 100% oxygen Intubation Type: IV induction Ventilation: Mask ventilation without difficulty LMA: LMA inserted LMA Size: 5.0 Number of attempts: 1 Placement Confirmation: positive ETCO2 and breath sounds checked- equal and bilateral Tube secured with: Tape (Gauze bite block in place) Dental Injury: Teeth and Oropharynx as per pre-operative assessment

## 2017-01-02 NOTE — Op Note (Signed)
Op Note:  [id number]  MRN:       [id number] CSN:       [id number]

## 2017-01-02 NOTE — Transfer of Care (Signed)
Immediate Anesthesia Transfer of Care Note  Patient: Samuel Walls  Procedure(s) Performed: Procedure(s): RADIOACTIVE SEED IMPLANT/BRACHYTHERAPY IMPLANT (N/A)  Patient Location: PACU  Anesthesia Type:General  Level of Consciousness: awake, alert , oriented and patient cooperative  Airway & Oxygen Therapy: Patient Spontanous Breathing and Patient connected to nasal cannula oxygen  Post-op Assessment: Report given to RN and Post -op Vital signs reviewed and stable  Post vital signs: Reviewed and stable  Last Vitals:  Vitals:   01/02/17 1149  BP: 136/69  Pulse: (!) 47  Resp: 18  Temp: 36.6 C    Last Pain:  Vitals:   01/02/17 1149  TempSrc: Oral      Patients Stated Pain Goal: 8 ([id number])  Complications: No apparent anesthesia complications

## 2017-01-02 NOTE — Anesthesia Postprocedure Evaluation (Signed)
Anesthesia Post Note  Patient: Finnian Albanez  Procedure(s) Performed: Procedure(s) (LRB): RADIOACTIVE SEED IMPLANT/BRACHYTHERAPY IMPLANT (N/A)  Patient location during evaluation: PACU Anesthesia Type: General Level of consciousness: awake and alert Pain management: pain level controlled Vital Signs Assessment: post-procedure vital signs reviewed and stable Respiratory status: spontaneous breathing, nonlabored ventilation and respiratory function stable Cardiovascular status: blood pressure returned to baseline and stable Postop Assessment: no signs of nausea or vomiting Anesthetic complications: no       Last Vitals:  Vitals:   01/02/17 1630 01/02/17 1645  BP: (!) 147/74 132/68  Pulse: (!) 58 66  Resp: 14 15  Temp:      Last Pain:  Vitals:   01/02/17 1149  TempSrc: Oral                 Katlynn Naser,W. EDMOND

## 2017-01-02 NOTE — Interval H&P Note (Signed)
History and Physical Interval Note:  01/02/2017 2:06 PM  Samuel Walls  has presented today for surgery, with the diagnosis of PROSTATE CANCER  The various methods of treatment have been discussed with the patient and family. After consideration of risks, benefits and other options for treatment, the patient has consented to  Procedure(s): RADIOACTIVE SEED IMPLANT/BRACHYTHERAPY IMPLANT (N/A) as a surgical intervention .  The patient's history has been reviewed, patient examined, no change in status, stable for surgery.  I have reviewed the patient's chart and labs.  Questions were answered to the patient's satisfaction.     Jalan Bodi

## 2017-01-02 NOTE — Discharge Instructions (Addendum)
Radioactive Seed Implant Home Care Instructions   Activity:    Rest for the remainder of the day.  Do not drive or operate equipment today.  You may resume normal activities in a few days as instructed by your physician, without risk of harmful radiation exposure to those around you, provided you follow the time and distance precautions on the Radiation Oncology Instruction Sheet.   Meals: Drink plenty of lipuids and eat light foods, such as gelatin or soup this evening .  You may return to normal meal plan tomorrow.  Return To Work: You may return to work as instructed by Naval architect.  Special Instruction:   If any seeds are found, use tweezers to pick up seeds and place in a glass container of any kind and bring to your physician's office.  Call your physician if any of these symptoms occur:   Persistent or heavy bleeding  Urine stream diminishes or stops completely after catheter is removed  Fever equal to or greater than 101 degrees F  Cloudy urine with a strong foul odor  Severe pain  You may feel some burning pain and/or hesitancy when you urinate after the catheter is removed.  These symptoms may increase over the next few weeks, but should diminish within forur to six weeks.  Applying moist heat to the lower abdomen or a hot tub bath may help relieve the pain.  If the discomfort becomes severe, please call your physician for additional medications.  Follow-up (Date of Return Visit to Physician):      Post Anesthesia Home Care Instructions  Activity: Get plenty of rest for the remainder of the day. A responsible adult should stay with you for 24 hours following the procedure.  For the next 24 hours, DO NOT: -Drive a car -Paediatric nurse -Drink alcoholic beverages -Take any medication unless instructed by your physician -Make any legal decisions or sign important papers.  Meals: Start with liquid foods such as gelatin or soup. Progress to regular foods as  tolerated. Avoid greasy, spicy, heavy foods. If nausea and/or vomiting occur, drink only clear liquids until the nausea and/or vomiting subsides. Call your physician if vomiting continues.  Special Instructions/Symptoms: Your throat may feel dry or sore from the anesthesia or the breathing tube placed in your throat during surgery. If this causes discomfort, gargle with warm salt water. The discomfort should disappear within 24 hours.  If you had a scopolamine patch placed behind your ear for the management of post- operative nausea and/or vomiting:  1. The medication in the patch is effective for 72 hours, after which it should be removed.  Wrap patch in a tissue and discard in the trash. Wash hands thoroughly with soap and water. 2. You may remove the patch earlier than 72 hours if you experience unpleasant side effects which may include dry mouth, dizziness or visual disturbances. 3. Avoid touching the patch. Wash your hands with soap and water after contact with the patch.

## 2017-01-02 NOTE — Anesthesia Preprocedure Evaluation (Addendum)
Anesthesia Evaluation  Patient identified by MRN, date of birth, ID band Patient awake    Reviewed: Allergy & Precautions, NPO status , Patient's Chart, lab work & pertinent test results  History of Anesthesia Complications Negative for: history of anesthetic complications  Airway Mallampati: II  TM Distance: >3 FB Neck ROM: Full    Dental no notable dental hx. (+) Dental Advisory Given, Teeth Intact, Caps,    Pulmonary neg pulmonary ROS,    Pulmonary exam normal        Cardiovascular hypertension, Pt. on medications + CAD, + Cardiac Stents and +CHF  Normal cardiovascular exam  Study Conclusions  - Left ventricle: The cavity size was normal. There was mild focal basal hypertrophy of the septum. Systolic function was normal. The estimated ejection fraction was 50%. There is akinesis of the mid-distalanteroseptal myocardium. There is akinesis of the apical myocardium. There was an increased relative contribution of atrial contraction to ventricular filling. Doppler parameters are consistent with abnormal left ventricular relaxation (grade 1 diastolic dysfunction). - Left atrium: The atrium was mildly dilated. - Right ventricle: The cavity size was mildly dilated. Wall thickness was normal.   Neuro/Psych negative neurological ROS  negative psych ROS   GI/Hepatic negative GI ROS, Neg liver ROS,   Endo/Other  negative endocrine ROS  Renal/GU      Musculoskeletal  (+) Arthritis , Osteoarthritis,    Abdominal   Peds  Hematology   Anesthesia Other Findings   Reproductive/Obstetrics                          Anesthesia Physical Anesthesia Plan  ASA: III  Anesthesia Plan: General   Post-op Pain Management:    Induction: Intravenous  Airway Management Planned: LMA  Additional Equipment:   Intra-op Plan:   Post-operative Plan: Extubation in OR  Informed Consent: I  have reviewed the patients History and Physical, chart, labs and discussed the procedure including the risks, benefits and alternatives for the proposed anesthesia with the patient or authorized representative who has indicated his/her understanding and acceptance.   Dental advisory given  Plan Discussed with: CRNA, Anesthesiologist and Surgeon  Anesthesia Plan Comments:         Anesthesia Quick Evaluation

## 2017-01-03 ENCOUNTER — Encounter (HOSPITAL_BASED_OUTPATIENT_CLINIC_OR_DEPARTMENT_OTHER): Payer: Self-pay | Admitting: Urology

## 2017-01-03 DIAGNOSIS — C61 Malignant neoplasm of prostate: Secondary | ICD-10-CM | POA: Diagnosis not present

## 2017-01-03 NOTE — Op Note (Signed)
NAME:  CLOYS, CORT.:  [id number]  MEDICAL RECORD NO.:  [id number]  LOCATION:                                 FACILITY:  PHYSICIAN:  Joie Bimler, M.D.          DATE OF BIRTH:  DATE OF PROCEDURE:  01/02/2017 DATE OF DISCHARGE:                              OPERATIVE REPORT   PREOPERATIVE DIAGNOSIS:  Stage I T1c prostate cancer.  POSTOPERATIVE DIAGNOSIS:  Stage I T1c prostate cancer.  PROCEDURES: 1. Transrectal ultrasound of the prostate. 2. Intraprostatic seed implantation of I-125 seeds. 3. Cystoscopy.  SURGEONS:  __________ 1. Joie Bimler, M.D. 2. Lora Paula, M.D.  ANESTHESIA:  General.  INDICATION FOR PROCEDURE:  The patient is a 76 year old white male with a history of an elevated PSA.  Previous prostate biopsy was performed and he had a positive core of Gleason 7 adenocarcinoma.  Presented his options, he has opted for definitive treatment of the cancer with radioactive seed implantation.  PROCEDURE IN DETAIL:  The patient was brought into the operating room and placed on the table in the supine position.  After adequate general anesthesia was achieved, Foley catheter was placed.  The patient was then carefully placed in exaggerated lithotomy with the Yellofin stirrups.  The scrotum was then shaved, prepped, and then taped up onto the lower abdomen to expose the perineum.  Red rubber catheter was then placed into the rectum to relieve excess air.  Transrectal ultrasound probe was placed and the prostate was then scanned from apex to seminal vesicles.  Needle anchors were then placed into the prostate in appropriate locations.  Using the [employer], prostate was then scanned and the individual images were obtained from the base to the apex.  These images were then reviewed by Dr. Tammi Klippel and myself.  The urethra, prostatic border, and rectum were then delineated.  The computer program then calculated appropriate seed placement.   All the individual images were then reviewed and seed placement was ascertained between the physicist, Dr. Tammi Klippel and myself.  Once seed placement was assured, the placement commenced.  The bladder neck was identified with the Foley balloon contrast.  The needles were then placed from anterior to posterior on the prostate to encompass the prostate in its entirety. A diffuse pattern of placement was placed with no portion being left untreated.  The final seeds were placed and the procedure was terminated.  Anchor needles were removed.  Full pelvic x-ray was then obtained.  Foley catheter was then removed as was the ultrasound probe. The patient was then re-prepped and draped and flexible cystoscopy performed.  This showed that there were no seeds within the bladder.  A rectal exam was performed and no foreign bodies were detected.  B and O suppository were then placed for pelvic analgesia. Under sterile conditions, a 16-French Foley was then replaced to drain the bladder.  The patient tolerated all this well.  He was then awakened and taken to the recovery room in good condition.    ______________________________ Joie Bimler, M.D.   ______________________________ Joie Bimler, M.D.    RC/MEDQ  D:  01/02/2017  T:  01/03/2017  Job:  [id number] 

## 2017-01-04 DIAGNOSIS — Z951 Presence of aortocoronary bypass graft: Secondary | ICD-10-CM | POA: Diagnosis not present

## 2017-01-04 DIAGNOSIS — Z79899 Other long term (current) drug therapy: Secondary | ICD-10-CM | POA: Diagnosis not present

## 2017-01-04 DIAGNOSIS — Z7982 Long term (current) use of aspirin: Secondary | ICD-10-CM | POA: Diagnosis not present

## 2017-01-04 DIAGNOSIS — Z955 Presence of coronary angioplasty implant and graft: Secondary | ICD-10-CM | POA: Diagnosis not present

## 2017-01-04 DIAGNOSIS — A419 Sepsis, unspecified organism: Secondary | ICD-10-CM | POA: Diagnosis not present

## 2017-01-04 DIAGNOSIS — C61 Malignant neoplasm of prostate: Secondary | ICD-10-CM | POA: Diagnosis not present

## 2017-01-04 DIAGNOSIS — A4151 Sepsis due to Escherichia coli [E. coli]: Secondary | ICD-10-CM | POA: Diagnosis not present

## 2017-01-04 DIAGNOSIS — G9340 Encephalopathy, unspecified: Secondary | ICD-10-CM | POA: Diagnosis not present

## 2017-01-04 DIAGNOSIS — N3000 Acute cystitis without hematuria: Secondary | ICD-10-CM | POA: Diagnosis not present

## 2017-01-04 DIAGNOSIS — I25810 Atherosclerosis of coronary artery bypass graft(s) without angina pectoris: Secondary | ICD-10-CM | POA: Diagnosis not present

## 2017-01-04 DIAGNOSIS — N390 Urinary tract infection, site not specified: Secondary | ICD-10-CM | POA: Diagnosis not present

## 2017-01-04 DIAGNOSIS — Z885 Allergy status to narcotic agent status: Secondary | ICD-10-CM | POA: Diagnosis not present

## 2017-01-04 DIAGNOSIS — R410 Disorientation, unspecified: Secondary | ICD-10-CM | POA: Diagnosis not present

## 2017-01-04 DIAGNOSIS — R509 Fever, unspecified: Secondary | ICD-10-CM | POA: Diagnosis not present

## 2017-01-07 DIAGNOSIS — N390 Urinary tract infection, site not specified: Secondary | ICD-10-CM | POA: Diagnosis not present

## 2017-01-07 DIAGNOSIS — A419 Sepsis, unspecified organism: Secondary | ICD-10-CM | POA: Diagnosis not present

## 2017-01-07 DIAGNOSIS — G9340 Encephalopathy, unspecified: Secondary | ICD-10-CM | POA: Diagnosis not present

## 2017-01-07 DIAGNOSIS — C61 Malignant neoplasm of prostate: Secondary | ICD-10-CM | POA: Diagnosis not present

## 2017-01-11 DIAGNOSIS — A4151 Sepsis due to Escherichia coli [E. coli]: Secondary | ICD-10-CM | POA: Diagnosis not present

## 2017-01-11 DIAGNOSIS — E785 Hyperlipidemia, unspecified: Secondary | ICD-10-CM | POA: Diagnosis not present

## 2017-01-11 DIAGNOSIS — C61 Malignant neoplasm of prostate: Secondary | ICD-10-CM | POA: Diagnosis not present

## 2017-01-11 DIAGNOSIS — R7301 Impaired fasting glucose: Secondary | ICD-10-CM | POA: Diagnosis not present

## 2017-01-11 DIAGNOSIS — Z0000 Encounter for general adult medical examination without abnormal findings: Secondary | ICD-10-CM | POA: Diagnosis not present

## 2017-01-11 DIAGNOSIS — N401 Enlarged prostate with lower urinary tract symptoms: Secondary | ICD-10-CM | POA: Diagnosis not present

## 2017-01-11 DIAGNOSIS — N183 Chronic kidney disease, stage 3 (moderate): Secondary | ICD-10-CM | POA: Diagnosis not present

## 2017-01-11 DIAGNOSIS — I129 Hypertensive chronic kidney disease with stage 1 through stage 4 chronic kidney disease, or unspecified chronic kidney disease: Secondary | ICD-10-CM | POA: Diagnosis not present

## 2017-01-11 DIAGNOSIS — I2510 Atherosclerotic heart disease of native coronary artery without angina pectoris: Secondary | ICD-10-CM | POA: Diagnosis not present

## 2017-01-13 ENCOUNTER — Other Ambulatory Visit: Payer: Self-pay | Admitting: Cardiology

## 2017-01-22 ENCOUNTER — Telehealth: Payer: Self-pay | Admitting: *Deleted

## 2017-01-22 NOTE — Telephone Encounter (Signed)
CALLED PATIENT TO REMIND OF POST SEED APPTS. FOR 01-23-17, SPOKE WITH PATIENT AND HE IS AWARE OF THESE APPTS.

## 2017-01-23 ENCOUNTER — Ambulatory Visit
Admission: RE | Admit: 2017-01-23 | Discharge: 2017-01-23 | Disposition: A | Discharge status: Home / Self Care | Payer: PPO | Source: Ambulatory Visit | Attending: Radiation Oncology | Admitting: Radiation Oncology

## 2017-01-23 VITALS — BP 141/80 | HR 44 | Resp 18

## 2017-01-23 DIAGNOSIS — R3911 Hesitancy of micturition: Secondary | ICD-10-CM | POA: Insufficient documentation

## 2017-01-23 DIAGNOSIS — Z885 Allergy status to narcotic agent status: Secondary | ICD-10-CM | POA: Diagnosis not present

## 2017-01-23 DIAGNOSIS — Z79899 Other long term (current) drug therapy: Secondary | ICD-10-CM | POA: Diagnosis not present

## 2017-01-23 DIAGNOSIS — R350 Frequency of micturition: Secondary | ICD-10-CM | POA: Insufficient documentation

## 2017-01-23 DIAGNOSIS — C61 Malignant neoplasm of prostate: Secondary | ICD-10-CM

## 2017-01-23 DIAGNOSIS — Z923 Personal history of irradiation: Secondary | ICD-10-CM | POA: Diagnosis not present

## 2017-01-23 NOTE — Progress Notes (Signed)
  Radiation Oncology         [phone]) [phone] ________________________________  Name: Samuel Walls MRN: [id number]  Date: 01/23/2017  DOB: 04-19-1941  COMPLEX SIMULATION NOTE  NARRATIVE:  The patient was brought to the [facility] today following prostate seed implantation approximately one month ago.  Identity was confirmed.  All relevant records and images related to the planned course of therapy were reviewed.  Then, the patient was set-up supine.  CT images were obtained.  The CT images were loaded into the planning software.  Then the prostate and rectum were contoured.  Treatment planning then occurred.  The implanted iodine 125 seeds were identified by the physics staff for projection of radiation distribution  I have requested : 3D Simulation  I have requested a DVH of the following structures: Prostate and rectum.    ________________________________  Sheral Apley Tammi Klippel, M.D.  This document serves as a record of services personally performed by Tyler Pita, MD. It was created on his behalf by Darcus Austin, a trained medical scribe. The creation of this record is based on the scribe's personal observations and the provider's statements to them. This document has been checked and approved by the attending provider.

## 2017-01-23 NOTE — Progress Notes (Signed)
Radiation Oncology         [phone] ________________________________  Name: Samuel Walls MRN: [id number]  Date: 01/23/2017  DOB: January 15, 1941  Follow-Up Visit Note  CC: Nicoletta Dress, MD  Joie Bimler, MD  Diagnosis:  76 y.o. 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 Malignant neoplasm of prostate (HCC) 185 C61     Interval Since Last Radiation: 3 weeks  01/02/17: Insertion of radioactive I-125 seeds into the prostate gland. 145 Gy, definitive therapy.  Narrative:  The patient returns today for routine follow-up.  He is complaining of increased urinary frequency and urinary hesitation symptoms. He filled out a questionnaire regarding urinary function today providing and overall IPSS score of 21 characterizing his symptoms as severe: nocturia ~x5-7 and a weak urine stream compared to his pre-seed state. Taking his Flomax BID seems to help. He is not sleeping well due to his nocturia, therefore he is fatigued.  His pre-implant score was 14. He denies any bowel symptoms, dysuria, hematuria, urinary leakage, or incontinence. His stool is loose, but denies diarrhea. The patient reports he was hospitalized for 3 days due to an infection following his seed implant.  ALLERGIES:  is allergic to hydrocodone.  Meds: Current Outpatient Prescriptions  Medication Sig Dispense Refill  . amLODipine (NORVASC) 2.5 MG tablet Take 1 tablet (2.5 mg total) by mouth daily. (Patient taking differently: Take 2.5 mg by mouth every morning. ) 90 tablet 3  . atorvastatin (LIPITOR) 40 MG tablet TAKE ONE TABLET BY MOUTH ONCE DAILY (Patient taking differently: TAKE ONE TABLET BY MOUTH ONCE DAILY---- takes in am) 90 tablet 1  . dutasteride (AVODART) 0.5 MG capsule     . losartan (COZAAR) 100 MG tablet TAKE ONE TABLET BY MOUTH ONCE DAILY 90 tablet 1  . Multiple Vitamin (MULTIVITAMIN WITH MINERALS) TABS Take 1 tablet by  mouth every morning.     . nitroGLYCERIN (NITROSTAT) 0.4 MG SL tablet DISSOLVE ONE TABLET UNDER THE TONGUE EVERY 5 MINUTES AS NEEDED FOR CHEST PAIN.  DO NOT EXCEED A TOTAL OF 3 DOSES IN 15 MINUTES 25 tablet 6  . omega-3 acid ethyl esters (LOVAZA) 1 G capsule Take 2 g by mouth every evening.     . tamsulosin (FLOMAX) 0.4 MG CAPS capsule      No current facility-administered medications for this encounter.     Physical Findings: The patient is in no acute distress. Patient is alert and oriented.  blood pressure is 141/80 (abnormal) and his pulse is 44 (abnormal). His respiration is 18 and oxygen saturation is 100%. .  No significant changes.  Lab Findings: Lab Results  Component Value Date   WBC 14.5 (H) 06/01/2012   HGB 13.0 06/01/2012   HCT 38.5 (L) 06/01/2012   MCV 90.0 06/01/2012   PLT 181 06/01/2012    Radiographic Findings:  Patient underwent CT imaging in our clinic for post implant dosimetry. The CT appears to demonstrate an adequate distribution of radioactive seeds throughout the prostate gland. There no seeds in her near the rectum. I suspect the final radiation plan and dosimetry will show appropriate coverage of the prostate gland.   Impression: The patient is recovering from the effects of radiation. His urinary symptoms should gradually improve over the next 4-6 months. We talked about this today. He is encouraged by his improvement already and is otherwise please with his outcome.   Plan: Today,  I spent time talking to the patient about his prostate seed implant and resolving urinary symptoms. We also talked about long-term follow-up for prostate cancer following seed implant. He understands that ongoing PSA determinations and digital rectal exams will help perform surveillance to rule out disease recurrence. He understands what to expect with his PSA measures. Patient was also educated today about some of the long-term effects from radiation including a small risk for rectal  bleeding and possibly erectile dysfunction. We talked about some of the general management approaches to these potential complications. However, I did encourage the patient to contact our office or return at any point if he has questions or concerns related to his previous radiation and prostate cancer. The patient is scheduled to follow up with Dr. Nila Nephew next week.  _____________________________________ Nicholos Johns, PA-C    ------------------------------------------------   Tyler Pita, MD [facility] [place] Director and Director of Stereotactic Radiosurgery Direct Dial: [phone]  Fax: [phone] .com  Skype  LinkedIn   This document serves as a record of services personally performed by Freeman Caldron, PA-C and Tyler Pita, MD. It was created on their behalf by Darcus Austin, a trained medical scribe. The creation of this record is based on the scribe's personal observations and the providers' statements to them. This document has been checked and approved by the attending provider.

## 2017-01-23 NOTE — Progress Notes (Signed)
Patient returns today for post seed follow up. Pre seed IPSS 14. Post seed IPSS 21. Reports he was hospitalized for three day due infection follow seed implant. Reports nocturia is his biggest complaint. Reports one night he was up 27 however last night he was up 5 times. Reports he is taking his Flomax earlier in the evening which seems to help. Reports his urine stream is weak compared to pre seed state. Denies dysuria. Denies hematuria. Denies urinary leakage or incontinence. Denies any bowel complaints. Reports he isn't sleeping well due to the nocturia thus, is fatigue. Scheduled to follow up with urology next week.   BP (!) 141/80 (BP Location: Left Arm, Patient Position: Sitting, Cuff Size: Normal)   Pulse (!) 44   Resp 18   SpO2 100%  Wt Readings from Last 3 Encounters:  01/02/17 195 lb 8 oz (88.7 kg)  11/20/16 202 lb 9.6 oz (91.9 kg)  06/18/16 199 lb 6.4 oz (90.4 kg)

## 2017-01-31 DIAGNOSIS — C61 Malignant neoplasm of prostate: Secondary | ICD-10-CM | POA: Diagnosis not present

## 2017-01-31 DIAGNOSIS — R351 Nocturia: Secondary | ICD-10-CM | POA: Diagnosis not present

## 2017-02-10 ENCOUNTER — Encounter: Payer: Self-pay | Admitting: Radiation Oncology

## 2017-02-10 DIAGNOSIS — C61 Malignant neoplasm of prostate: Secondary | ICD-10-CM | POA: Diagnosis not present

## 2017-02-10 DIAGNOSIS — R7989 Other specified abnormal findings of blood chemistry: Secondary | ICD-10-CM | POA: Diagnosis not present

## 2017-02-10 DIAGNOSIS — N183 Chronic kidney disease, stage 3 (moderate): Secondary | ICD-10-CM | POA: Diagnosis not present

## 2017-02-10 NOTE — Progress Notes (Signed)
  Radiation Oncology         [phone]) [phone] ________________________________  Name: Samuel Walls MRN: [id number]  Date: 02/10/2017  DOB: 1941-08-14  3D Planning Note   Prostate Brachytherapy Post-Implant Dosimetry  Diagnosis: 76 y.o. gentleman with stage T1c adenocarcinoma of the prostate with a Gleason's score of 3+4=7 and a PSA of 4.9  Narrative: On a previous date, [name] returned following prostate seed implantation for post implant planning. He underwent CT scan complex simulation to delineate the three-dimensional structures of the pelvis and demonstrate the radiation distribution.  Since that time, the seed localization, and complex isodose planning with dose volume histograms have now been completed.  Results:   Prostate Coverage - The dose of radiation delivered to the 90% or more of the prostate gland (D90) was 118.6% of the prescription dose. This exceeds our goal of greater than 90%. Rectal Sparing - The volume of rectal tissue receiving the prescription dose or higher was 0.34 cc. This falls under our thresholds tolerance of 1.0 cc.  Impression: The prostate seed implant appears to show adequate target coverage and appropriate rectal sparing.  Plan:  The patient will continue to follow with urology for ongoing PSA determinations. I would anticipate a high likelihood for local tumor control with minimal risk for rectal morbidity.  ________________________________  Sheral Apley Tammi Klippel, M.D.

## 2017-02-19 DIAGNOSIS — R7989 Other specified abnormal findings of blood chemistry: Secondary | ICD-10-CM | POA: Diagnosis not present

## 2017-03-24 DIAGNOSIS — E785 Hyperlipidemia, unspecified: Secondary | ICD-10-CM | POA: Diagnosis not present

## 2017-03-24 DIAGNOSIS — Z9181 History of falling: Secondary | ICD-10-CM | POA: Diagnosis not present

## 2017-03-24 DIAGNOSIS — Z1389 Encounter for screening for other disorder: Secondary | ICD-10-CM | POA: Diagnosis not present

## 2017-03-24 DIAGNOSIS — R7989 Other specified abnormal findings of blood chemistry: Secondary | ICD-10-CM | POA: Diagnosis not present

## 2017-03-24 DIAGNOSIS — Z0000 Encounter for general adult medical examination without abnormal findings: Secondary | ICD-10-CM | POA: Diagnosis not present

## 2017-03-24 DIAGNOSIS — Z136 Encounter for screening for cardiovascular disorders: Secondary | ICD-10-CM | POA: Diagnosis not present

## 2017-03-24 DIAGNOSIS — Z125 Encounter for screening for malignant neoplasm of prostate: Secondary | ICD-10-CM | POA: Diagnosis not present

## 2017-04-16 DIAGNOSIS — R351 Nocturia: Secondary | ICD-10-CM | POA: Diagnosis not present

## 2017-04-16 DIAGNOSIS — C61 Malignant neoplasm of prostate: Secondary | ICD-10-CM | POA: Diagnosis not present

## 2017-04-16 DIAGNOSIS — N529 Male erectile dysfunction, unspecified: Secondary | ICD-10-CM | POA: Diagnosis not present

## 2017-06-19 ENCOUNTER — Encounter: Payer: Self-pay | Admitting: Cardiology

## 2017-06-19 ENCOUNTER — Encounter (INDEPENDENT_AMBULATORY_CARE_PROVIDER_SITE_OTHER): Payer: Self-pay

## 2017-06-19 ENCOUNTER — Ambulatory Visit (INDEPENDENT_AMBULATORY_CARE_PROVIDER_SITE_OTHER): Payer: PPO | Admitting: Cardiology

## 2017-06-19 VITALS — BP 144/72 | HR 50 | Ht 70.0 in | Wt 202.6 lb

## 2017-06-19 DIAGNOSIS — I2583 Coronary atherosclerosis due to lipid rich plaque: Secondary | ICD-10-CM | POA: Diagnosis not present

## 2017-06-19 DIAGNOSIS — I255 Ischemic cardiomyopathy: Secondary | ICD-10-CM

## 2017-06-19 DIAGNOSIS — I10 Essential (primary) hypertension: Secondary | ICD-10-CM | POA: Diagnosis not present

## 2017-06-19 DIAGNOSIS — R001 Bradycardia, unspecified: Secondary | ICD-10-CM

## 2017-06-19 DIAGNOSIS — I2510 Atherosclerotic heart disease of native coronary artery without angina pectoris: Secondary | ICD-10-CM | POA: Diagnosis not present

## 2017-06-19 NOTE — Progress Notes (Signed)
Cardiology Office Note   Date:  06/19/2017   ID:  Samuel Walls, DOB August 20, 1941, MRN [id number]  PCP:  Nicoletta Dress, MD  Cardiologist:   Candee Furbish, MD (former Ron Parker)      History of Present Illness: Samuel Walls is a 76 y.o. male who presents for follow up coronary disease. He is doing well. He has sinus bradycardia that is asymptomatic. We are not able to use any beta blockers. He had left ventricular dysfunction in the past. Fortunately this improved over time with an ARB. He is not having any significant chest pain or shortness of breath. His most recent echo showed improvement of his ejection fraction up to 50% .   No syncopal episodes, does not seem to be affected by his chronic bradycardia. No chest pain, no fevers, no chills , no bleeding.  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   He is from [place]   Past Medical History:  Diagnosis Date  . BPH (benign prostatic hyperplasia)   . CAD (coronary artery disease) cardiologist-  dr Joellyn Quails   a.  NSTEMI  05-31-2012  s/p  DES to pLAD  . Chronic sinus bradycardia   . History of acute congestive heart failure    [phone]  diastolic and systolic in setting NSTEMI  . History of non-ST elevation myocardial infarction (NSTEMI)    07/ 2013   s/p  DES to LAD  . Hyperlipidemia LDL goal < 70   . Ischemic cardiomyopathy    07/ 2013  per cardiac cath ef 35%/  last echo 11-29-2013  improved at 50%  . Nocturia   . Prostate cancer [facility]) urologist-  dr chao/  oncologist-  dr Tammi Klippel   T1c, Gleason 7,  PSA 4.9  . S/P drug eluting coronary stent placement    05-31-2012   DES x1  to pLAD  . Wears glasses     Past Surgical History:  Procedure Laterality Date  . ANKLE CLOSED REDUCTION Left 2000  . LEFT HEART CATHETERIZATION WITH CORONARY ANGIOGRAM N/A 05/31/2012   Procedure: LEFT HEART CATHETERIZATION WITH CORONARY ANGIOGRAM;  Surgeon: Sherren Mocha, MD;  Location: [facility] CATH LAB;  Service: Cardiovascular;   Laterality: N/A;  PCI w/ DES x1 to pLAD (95%);  moderate stenoses of RCA and CLx;  moderate severe segmental LV dysfunction, ef 35%  . PROSTATE BIOPSY    . RADIOACTIVE SEED IMPLANT N/A 01/02/2017   Procedure: RADIOACTIVE SEED IMPLANT/BRACHYTHERAPY IMPLANT;  Surgeon: Joie Bimler, MD;  Location: [facility];  Service: Urology;  Laterality: N/A;  . TRANSTHORACIC ECHOCARDIOGRAM  11/29/2013   midl focal basal LVH of the septum,  EF 50%, akinesis of the mid-distalanteroseptal and apical myocardium,  grade 1 diastolic dysfunction/  mild LAE/  mild dilated aortic root/  trivial MR and TR     Current Outpatient Prescriptions  Medication Sig Dispense Refill  . amLODipine (NORVASC) 2.5 MG tablet Take 2.5 mg by mouth daily.    Marland Kitchen atorvastatin (LIPITOR) 40 MG tablet Take 40 mg by mouth daily.    Marland Kitchen losartan (COZAAR) 100 MG tablet TAKE ONE TABLET BY MOUTH ONCE DAILY 90 tablet 1  . Multiple Vitamin (MULTIVITAMIN WITH MINERALS) TABS Take 1 tablet by mouth every morning.     . nitroGLYCERIN (NITROSTAT) 0.4 MG SL tablet DISSOLVE ONE TABLET UNDER THE TONGUE EVERY 5 MINUTES AS NEEDED FOR CHEST PAIN.  DO NOT EXCEED A TOTAL OF 3 DOSES IN 15 MINUTES 25 tablet 6  .  omega-3 acid ethyl esters (LOVAZA) 1 G capsule Take 2 g by mouth every evening.     . tamsulosin (FLOMAX) 0.4 MG CAPS capsule Take 0.4 mg by mouth daily after supper.      No current facility-administered medications for this visit.     Allergies:   Hydrocodone    Social History:  The patient  reports that he has never smoked. He has never used smokeless tobacco. He reports that he does not drink alcohol or use drugs.   Family History:  The patient's family history includes ALS in his mother; Other in his father.    ROS:  Please see the history of present illness.   Otherwise, review of systems are positive for none.   All other systems are reviewed and negative.    PHYSICAL EXAM: VS:  BP (!) 144/72   Pulse (!) 50   Ht 5\' 10"   (1.778 m)   Wt 202 lb 9.6 oz (91.9 kg)   BMI 29.07 kg/m  , BMI Body mass index is 29.07 kg/m. GEN: Well nourished, well developed, in no acute distress  HEENT: normal  Neck: no JVD, carotid bruits, or masses Cardiac: Bradycardic regular; no murmurs, rubs, or gallops,no edema  Respiratory:  clear to auscultation bilaterally, normal work of breathing GI: soft, nontender, nondistended, + BS MS: no deformity or atrophy  Skin: warm and dry, no rash Neuro:  Strength and sensation are intact Psych: euthymic mood, full affect   EKG:  Today 11/24/15:  Sinus pericardial rate 46, old anterior lateral infarct pattern , nonspecific T wave changes. Personally viewed. Previous on 10/26/14 showed sinus bradycardia with PVC4   Recent Labs: No results found for requested labs within last 8760 hours.    Lipid Panel    Component Value Date/Time   CHOL 175 05/31/2012 0450   TRIG 61 05/31/2012 0450   HDL 45 05/31/2012 0450   CHOLHDL 3.9 05/31/2012 0450   VLDL 12 05/31/2012 0450   LDLCALC 118 (H) 05/31/2012 0450      Wt Readings from Last 3 Encounters:  06/19/17 202 lb 9.6 oz (91.9 kg)  01/02/17 195 lb 8 oz (88.7 kg)  11/20/16 202 lb 9.6 oz (91.9 kg)      Other studies Reviewed: Additional studies/ records that were reviewed today include:  Prior office visit reviewed , lab work, EKG. Review of the above records demonstrates:  As above   ASSESSMENT AND PLAN:  Bradycardia  -  Asymptomatic sinus bradycardia, no beta blockers , left ventricular function improved overtime without use of beta blocker. Stable. No change.Overall doing well.   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 DES to LAD.  -  Moderate scattered disease elsewhere.  -  EF has normalized from prior of 35%.  -  No anginal symptoms.   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 Excellent. No changes, no myalgias.   Ischemic cardiomyopathy  -  Prior EF 35 , now 50%. Reassurance.  - Continuing with low-dose losartan. Not on beta blocker because of previous bradycardia.  - He continues to work in a textile history.  Current medicines are reviewed at length with the patient today.  The patient does not have concerns regarding medicines.  The following changes have been made:  no change  Labs/ tests ordered today  include: none  No orders of the defined types were placed in this encounter.    Disposition:   FU with [facility] in 6 months Signed, Candee Furbish, MD  06/19/2017 3:29 PM    [facility] [address], [place], [place]  [address] Phone: [phone]; Fax: [phone]

## 2017-06-19 NOTE — Patient Instructions (Signed)

## 2017-07-11 DIAGNOSIS — R7301 Impaired fasting glucose: Secondary | ICD-10-CM | POA: Diagnosis not present

## 2017-07-11 DIAGNOSIS — N183 Chronic kidney disease, stage 3 (moderate): Secondary | ICD-10-CM | POA: Diagnosis not present

## 2017-07-11 DIAGNOSIS — I129 Hypertensive chronic kidney disease with stage 1 through stage 4 chronic kidney disease, or unspecified chronic kidney disease: Secondary | ICD-10-CM | POA: Diagnosis not present

## 2017-07-11 DIAGNOSIS — E785 Hyperlipidemia, unspecified: Secondary | ICD-10-CM | POA: Diagnosis not present

## 2017-07-11 DIAGNOSIS — I2510 Atherosclerotic heart disease of native coronary artery without angina pectoris: Secondary | ICD-10-CM | POA: Diagnosis not present

## 2017-07-11 DIAGNOSIS — Z6828 Body mass index (BMI) 28.0-28.9, adult: Secondary | ICD-10-CM | POA: Diagnosis not present

## 2017-07-11 DIAGNOSIS — R001 Bradycardia, unspecified: Secondary | ICD-10-CM | POA: Diagnosis not present

## 2017-07-19 DIAGNOSIS — I639 Cerebral infarction, unspecified: Secondary | ICD-10-CM

## 2017-07-19 HISTORY — DX: Cerebral infarction, unspecified: I63.9

## 2017-07-23 ENCOUNTER — Other Ambulatory Visit: Payer: Self-pay | Admitting: Cardiology

## 2017-08-11 ENCOUNTER — Encounter (HOSPITAL_COMMUNITY): Payer: Self-pay | Admitting: Internal Medicine

## 2017-08-11 ENCOUNTER — Inpatient Hospital Stay (HOSPITAL_COMMUNITY): Payer: PPO

## 2017-08-11 ENCOUNTER — Emergency Department (HOSPITAL_COMMUNITY): Payer: PPO

## 2017-08-11 ENCOUNTER — Inpatient Hospital Stay (HOSPITAL_COMMUNITY)
Admission: EM | Admit: 2017-08-11 | Discharge: 2017-08-13 | DRG: 041 | Disposition: A | Discharge status: Home / Self Care | Payer: PPO | Attending: Internal Medicine | Admitting: Internal Medicine

## 2017-08-11 DIAGNOSIS — R001 Bradycardia, unspecified: Secondary | ICD-10-CM | POA: Diagnosis not present

## 2017-08-11 DIAGNOSIS — I2510 Atherosclerotic heart disease of native coronary artery without angina pectoris: Secondary | ICD-10-CM | POA: Diagnosis present

## 2017-08-11 DIAGNOSIS — I110 Hypertensive heart disease with heart failure: Secondary | ICD-10-CM | POA: Diagnosis not present

## 2017-08-11 DIAGNOSIS — I638 Other cerebral infarction: Secondary | ICD-10-CM | POA: Diagnosis not present

## 2017-08-11 DIAGNOSIS — I253 Aneurysm of heart: Secondary | ICD-10-CM | POA: Diagnosis present

## 2017-08-11 DIAGNOSIS — R748 Abnormal levels of other serum enzymes: Secondary | ICD-10-CM

## 2017-08-11 DIAGNOSIS — E785 Hyperlipidemia, unspecified: Secondary | ICD-10-CM | POA: Diagnosis present

## 2017-08-11 DIAGNOSIS — I63441 Cerebral infarction due to embolism of right cerebellar artery: Principal | ICD-10-CM | POA: Diagnosis present

## 2017-08-11 DIAGNOSIS — R4781 Slurred speech: Secondary | ICD-10-CM | POA: Diagnosis not present

## 2017-08-11 DIAGNOSIS — I255 Ischemic cardiomyopathy: Secondary | ICD-10-CM | POA: Diagnosis not present

## 2017-08-11 DIAGNOSIS — I5022 Chronic systolic (congestive) heart failure: Secondary | ICD-10-CM | POA: Diagnosis present

## 2017-08-11 DIAGNOSIS — Z885 Allergy status to narcotic agent status: Secondary | ICD-10-CM

## 2017-08-11 DIAGNOSIS — I252 Old myocardial infarction: Secondary | ICD-10-CM

## 2017-08-11 DIAGNOSIS — R778 Other specified abnormalities of plasma proteins: Secondary | ICD-10-CM | POA: Diagnosis present

## 2017-08-11 DIAGNOSIS — I2583 Coronary atherosclerosis due to lipid rich plaque: Secondary | ICD-10-CM

## 2017-08-11 DIAGNOSIS — G8194 Hemiplegia, unspecified affecting left nondominant side: Secondary | ICD-10-CM | POA: Diagnosis not present

## 2017-08-11 DIAGNOSIS — Z955 Presence of coronary angioplasty implant and graft: Secondary | ICD-10-CM

## 2017-08-11 DIAGNOSIS — I639 Cerebral infarction, unspecified: Secondary | ICD-10-CM | POA: Diagnosis not present

## 2017-08-11 DIAGNOSIS — R7989 Other specified abnormal findings of blood chemistry: Secondary | ICD-10-CM

## 2017-08-11 DIAGNOSIS — Q2112 Patent foramen ovale: Secondary | ICD-10-CM

## 2017-08-11 DIAGNOSIS — I340 Nonrheumatic mitral (valve) insufficiency: Secondary | ICD-10-CM | POA: Diagnosis not present

## 2017-08-11 DIAGNOSIS — I361 Nonrheumatic tricuspid (valve) insufficiency: Secondary | ICD-10-CM | POA: Diagnosis not present

## 2017-08-11 DIAGNOSIS — Q211 Atrial septal defect: Secondary | ICD-10-CM

## 2017-08-11 DIAGNOSIS — R29810 Facial weakness: Secondary | ICD-10-CM | POA: Diagnosis not present

## 2017-08-11 DIAGNOSIS — R29704 NIHSS score 4: Secondary | ICD-10-CM | POA: Diagnosis not present

## 2017-08-11 DIAGNOSIS — I10 Essential (primary) hypertension: Secondary | ICD-10-CM | POA: Diagnosis not present

## 2017-08-11 DIAGNOSIS — C61 Malignant neoplasm of prostate: Secondary | ICD-10-CM | POA: Diagnosis present

## 2017-08-11 DIAGNOSIS — G4733 Obstructive sleep apnea (adult) (pediatric): Secondary | ICD-10-CM

## 2017-08-11 DIAGNOSIS — I6340 Cerebral infarction due to embolism of unspecified cerebral artery: Secondary | ICD-10-CM | POA: Diagnosis not present

## 2017-08-11 LAB — DIFFERENTIAL
BASOS ABS: 0.1 10*3/uL (ref 0.0–0.1)
Basophils Relative: 1 %
Eosinophils Absolute: 0.2 10*3/uL (ref 0.0–0.7)
Eosinophils Relative: 3 %
LYMPHS ABS: 1.8 10*3/uL (ref 0.7–4.0)
LYMPHS PCT: 22 %
Monocytes Absolute: 0.7 10*3/uL (ref 0.1–1.0)
Monocytes Relative: 9 %
NEUTROS PCT: 67 %
Neutro Abs: 5.4 10*3/uL (ref 1.7–7.7)

## 2017-08-11 LAB — URINALYSIS, ROUTINE W REFLEX MICROSCOPIC
BILIRUBIN URINE: NEGATIVE
GLUCOSE, UA: NEGATIVE mg/dL
Hgb urine dipstick: NEGATIVE
KETONES UR: NEGATIVE mg/dL
Leukocytes, UA: NEGATIVE
NITRITE: NEGATIVE
PH: 5 (ref 5.0–8.0)
PROTEIN: 30 mg/dL — AB
RBC / HPF: NONE SEEN RBC/hpf (ref 0–5)
Specific Gravity, Urine: 1.028 (ref 1.005–1.030)

## 2017-08-11 LAB — RAPID URINE DRUG SCREEN, HOSP PERFORMED
AMPHETAMINES: NOT DETECTED
BENZODIAZEPINES: NOT DETECTED
Barbiturates: NOT DETECTED
Cocaine: NOT DETECTED
OPIATES: NOT DETECTED
Tetrahydrocannabinol: NOT DETECTED

## 2017-08-11 LAB — CBC
HCT: 38.3 % — ABNORMAL LOW (ref 39.0–52.0)
HEMOGLOBIN: 12.4 g/dL — AB (ref 13.0–17.0)
MCH: 29.7 pg (ref 26.0–34.0)
MCHC: 32.4 g/dL (ref 30.0–36.0)
MCV: 91.6 fL (ref 78.0–100.0)
Platelets: 202 10*3/uL (ref 150–400)
RBC: 4.18 MIL/uL — AB (ref 4.22–5.81)
RDW: 14.2 % (ref 11.5–15.5)
WBC: 8.2 10*3/uL (ref 4.0–10.5)

## 2017-08-11 LAB — COMPREHENSIVE METABOLIC PANEL
ALBUMIN: 3.8 g/dL (ref 3.5–5.0)
ALK PHOS: 114 U/L (ref 38–126)
ALT: 20 U/L (ref 17–63)
ANION GAP: 6 (ref 5–15)
AST: 34 U/L (ref 15–41)
BILIRUBIN TOTAL: 1 mg/dL (ref 0.3–1.2)
BUN: 27 mg/dL — ABNORMAL HIGH (ref 6–20)
CALCIUM: 9.4 mg/dL (ref 8.9–10.3)
CO2: 27 mmol/L (ref 22–32)
Chloride: 110 mmol/L (ref 101–111)
Creatinine, Ser: 1.58 mg/dL — ABNORMAL HIGH (ref 0.61–1.24)
GFR calc Af Amer: 48 mL/min — ABNORMAL LOW (ref >60)
GFR calc non Af Amer: 41 mL/min — ABNORMAL LOW (ref >60)
GLUCOSE: 127 mg/dL — AB (ref 65–99)
POTASSIUM: 3.5 mmol/L (ref 3.5–5.1)
Sodium: 143 mmol/L (ref 135–145)
TOTAL PROTEIN: 7.1 g/dL (ref 6.5–8.1)

## 2017-08-11 LAB — PROTIME-INR
INR: 1.08
Prothrombin Time: 13.9 seconds (ref 11.4–15.2)

## 2017-08-11 LAB — I-STAT CHEM 8, ED
BUN: 28 mg/dL — AB (ref 6–20)
CALCIUM ION: 1.18 mmol/L (ref 1.15–1.40)
CHLORIDE: 107 mmol/L (ref 101–111)
Creatinine, Ser: 1.5 mg/dL — ABNORMAL HIGH (ref 0.61–1.24)
Glucose, Bld: 125 mg/dL — ABNORMAL HIGH (ref 65–99)
HEMATOCRIT: 40 % (ref 39.0–52.0)
Hemoglobin: 13.6 g/dL (ref 13.0–17.0)
Potassium: 3.5 mmol/L (ref 3.5–5.1)
SODIUM: 146 mmol/L — AB (ref 135–145)
TCO2: 25 mmol/L (ref 22–32)

## 2017-08-11 LAB — I-STAT TROPONIN, ED: Troponin i, poc: 0.23 ng/mL (ref 0.00–0.08)

## 2017-08-11 LAB — APTT: APTT: 26 s (ref 24–36)

## 2017-08-11 LAB — TROPONIN I: Troponin I: 0.27 ng/mL (ref <0.03)

## 2017-08-11 MED ORDER — SODIUM CHLORIDE 0.9 % IV SOLN
INTRAVENOUS | Status: DC
  Administered 2017-08-11: 21:00:00 via INTRAVENOUS

## 2017-08-11 MED ORDER — STROKE: EARLY STAGES OF RECOVERY BOOK
Freq: Once | Status: AC
  Administered 2017-08-12
  Filled 2017-08-11: qty 1

## 2017-08-11 MED ORDER — ACETAMINOPHEN 325 MG PO TABS: 650.0000 mg | ORAL_TABLET | ORAL | Status: DC | PRN

## 2017-08-11 MED ORDER — ATORVASTATIN CALCIUM 40 MG PO TABS
40.0000 mg | ORAL_TABLET | Freq: Every day | ORAL | Status: DC
  Administered 2017-08-12 – 2017-08-13 (×2): 40 mg via ORAL
  Filled 2017-08-11 (×2): qty 1

## 2017-08-11 MED ORDER — ASPIRIN 300 MG RE SUPP
300.0000 mg | Freq: Every day | RECTAL | Status: DC
  Administered 2017-08-11: 300 mg via RECTAL
  Filled 2017-08-11: qty 1

## 2017-08-11 MED ORDER — ACETAMINOPHEN 160 MG/5ML PO SOLN: 650.0000 mg | ORAL | Status: DC | PRN

## 2017-08-11 MED ORDER — ASPIRIN 325 MG PO TABS
325.0000 mg | ORAL_TABLET | Freq: Every day | ORAL | Status: DC
  Administered 2017-08-12 – 2017-08-13 (×2): 325 mg via ORAL
  Filled 2017-08-11 (×2): qty 1

## 2017-08-11 MED ORDER — ACETAMINOPHEN 650 MG RE SUPP: 650.0000 mg | RECTAL | Status: DC | PRN

## 2017-08-11 NOTE — ED Provider Notes (Signed)
[place] DEPT Provider Note   CSN: [id number] Arrival date & time: 08/11/17  1612     History   Chief Complaint Chief Complaint  Patient presents with  . Weakness    HPI Samuel Walls is a 76 y.o. male.  The history is provided by the patient.  Weakness  Primary symptoms include focal weakness, loss of balance, speech change.  Primary symptoms include no loss of sensation, no visual change. This is a new problem. The current episode started 6 to 12 hours ago. The problem has been rapidly improving. There was left facial, left upper extremity and left lower extremity focality noted. There has been no fever. Pertinent negatives include no shortness of breath, no chest pain, no vomiting and no altered mental status.   76 year old male who presents with left sided weakness. History of CAD s/p stent, HLD, HTN. His wife reports that he had a fall at 8 Am this morning. However, seemed to be himself. At 10 Am while eating breakfast, he was drooling, slurred speech, and dropping things out of his left hand. Having mild difficulty walking. Symptoms mildly improved since onset. No vision changes. No headache, nausea, vomiting, chest pain or difficulty breathing.   Past Medical History:  Diagnosis Date  . BPH (benign prostatic hyperplasia)   . CAD (coronary artery disease) cardiologist-  dr Joellyn Quails   a.  NSTEMI  05-31-2012  s/p  DES to pLAD  . Chronic sinus bradycardia   . History of acute congestive heart failure    [id number]  diastolic and systolic in setting NSTEMI  . History of non-ST elevation myocardial infarction (NSTEMI)    07/ 2013   s/p  DES to LAD  . Hyperlipidemia LDL goal < 70   . Ischemic cardiomyopathy    07/ 2013  per cardiac cath ef 35%/  last echo 11-29-2013  improved at 50%  . Nocturia   . 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 S/P drug eluting coronary stent placement    05-31-2012   DES x1  to  pLAD  . Wears glasses     Patient Active Problem List   Diagnosis Date Noted  . Malignant neoplasm of prostate ([place]) 11/20/2016  . Sinus bradycardia 11/24/2015  . Essential hypertension 04/29/2014  . 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 CAD (coronary artery disease)   . Hyperlipidemia LDL goal < 70   . Bradycardia   . Ischemic cardiomyopathy   . Hyperlipidemia 06/11/2012  . Cough 06/11/2012    Past Surgical History:  Procedure Laterality Date  . ANKLE CLOSED REDUCTION Left 2000  . LEFT HEART CATHETERIZATION WITH CORONARY ANGIOGRAM N/A 05/31/2012   Procedure: LEFT HEART CATHETERIZATION WITH CORONARY ANGIOGRAM;  Surgeon: Sherren Mocha, MD;  Location: [facility] CATH LAB;  Service: Cardiovascular;  Laterality: N/A;  PCI w/ DES x1 to pLAD (95%);  moderate stenoses of RCA and CLx;  moderate severe segmental LV dysfunction, ef 35%  . PROSTATE BIOPSY    . RADIOACTIVE SEED IMPLANT N/A 01/02/2017   Procedure: RADIOACTIVE SEED IMPLANT/BRACHYTHERAPY IMPLANT;  Surgeon: Joie Bimler, MD;  Location: [facility];  Service: Urology;  Laterality: N/A;  . TRANSTHORACIC ECHOCARDIOGRAM  11/29/2013   midl focal basal LVH of the septum,  EF 50%, akinesis of the mid-distalanteroseptal and apical myocardium,  grade 1 diastolic dysfunction/  mild LAE/  mild dilated aortic root/  trivial MR and TR  Home Medications    Prior to Admission medications   Medication Sig Start Date End Date Taking? Authorizing Provider  amLODipine (NORVASC) 2.5 MG tablet Take 2.5 mg by mouth daily.   Yes [provider]  atorvastatin (LIPITOR) 40 MG tablet Take 40 mg by mouth daily.   Yes [provider]  losartan (COZAAR) 100 MG tablet TAKE 1 TABLET BY MOUTH ONCE DAILY 07/24/17  Yes Jerline Pain, MD  Multiple Vitamin (MULTIVITAMIN WITH MINERALS) TABS Take 1 tablet by mouth every morning.    Yes [provider]  nitroGLYCERIN (NITROSTAT) 0.4 MG SL tablet DISSOLVE ONE TABLET UNDER THE TONGUE  EVERY 5 MINUTES AS NEEDED FOR CHEST PAIN.  DO NOT EXCEED A TOTAL OF 3 DOSES IN 15 MINUTES 06/18/16  Yes Jerline Pain, MD  omega-3 acid ethyl esters (LOVAZA) 1 G capsule Take 2 g by mouth every evening.    Yes [provider]    Family History Family History  Problem Relation Age of Onset  . ALS Mother   . Other Father   . Cancer Neg Hx     Social History Social History  Substance Use Topics  . Smoking status: Never Smoker  . Smokeless tobacco: Never Used  . Alcohol use No     Allergies   Hydrocodone   Review of Systems Review of Systems  Respiratory: Negative for shortness of breath.   Cardiovascular: Negative for chest pain.  Gastrointestinal: Negative for vomiting.  Neurological: Positive for speech change, focal weakness, weakness and loss of balance.  All other systems reviewed and are negative.    Physical Exam Updated Vital Signs BP 135/86 (BP Location: Left Arm)   Pulse (!) 55   Temp 98.2 F (36.8 C) (Oral)   Resp 18   Ht 5\' 11"  (1.803 m)   Wt 89.2 kg (196 lb 10.4 oz)   SpO2 100%   BMI 27.43 kg/m   Physical Exam Physical Exam  Nursing note and vitals reviewed. Constitutional: Well developed, well nourished, non-toxic, and in no acute distress Head: Normocephalic and atraumatic.  Mouth/Throat: Oropharynx is clear and moist.  Neck: Normal range of motion. Neck supple.  Cardiovascular: Normal rate and regular rhythm.   Pulmonary/Chest: Effort normal and breath sounds normal.  Abdominal: Soft. There is no tenderness. There is no rebound and no guarding.  Musculoskeletal: Normal range of motion.  Skin: Skin is warm and dry.  Psychiatric: Cooperative Neurological:  Alert, oriented to person, place, time, and situation. Memory grossly in tact. Fluent speech. No dysarthria or aphasia.  Cranial nerves: VF are full.  EOMI without nystagmus. No gaze deviation. Mild left sided facial droop at rest and with smile.  Sensation to light touch over face  in tact bilaterally. Hearing grossly in tact. Palate elevates symmetrically. Head turn and shoulder shrug are intact. Tongue midline.  Reflexes defered.  Muscle bulk and tone normal. Mild left sided pronator drift. Full strength in bilateral lower extremities.  Sensation to light touch is in tact throughout in bilateral upper and lower extremities. Coordination reveals no dysmetria with finger to nose, slowing of LUE due to subtle weakness    ED Treatments / Results  Labs (all labs ordered are listed, but only abnormal results are displayed) Labs Reviewed  CBC - Abnormal; Notable for the following:       Result Value   RBC 4.18 (*)    Hemoglobin 12.4 (*)    HCT 38.3 (*)    All other components within  normal limits  COMPREHENSIVE METABOLIC PANEL - Abnormal; Notable for the following:    Glucose, Bld 127 (*)    BUN 27 (*)    Creatinine, Ser 1.58 (*)    GFR calc non Af Amer 41 (*)    GFR calc Af Amer 48 (*)    All other components within normal limits  I-STAT CHEM 8, ED - Abnormal; Notable for the following:    Sodium 146 (*)    BUN 28 (*)    Creatinine, Ser 1.50 (*)    Glucose, Bld 125 (*)    All other components within normal limits  I-STAT TROPONIN, ED - Abnormal; Notable for the following:    Troponin i, poc 0.23 (*)    All other components within normal limits  PROTIME-INR  APTT  DIFFERENTIAL  ETHANOL  RAPID URINE DRUG SCREEN, HOSP PERFORMED  URINALYSIS, ROUTINE W REFLEX MICROSCOPIC    EKG  EKG Interpretation  Date/Time:  Monday August 11 2017 17:36:21 EDT Ventricular Rate:  50 PR Interval:    QRS Duration: 101 QT Interval:  443 QTC Calculation: 404 R Axis:   39 Text Interpretation:  Sinus rhythm Atrial premature complex Probable lateral infarct, old similar to previous EKG  Confirmed by Brantley Stage [id number]) on 08/11/2017 5:52:03 PM       Radiology Ct Head Code Stroke Wo Contrast  Result Date: 08/11/2017 CLINICAL DATA:  Code stroke. Slurred speech. Ataxia.  Suspect stroke. History of hyperlipidemia . EXAM: CT HEAD WITHOUT CONTRAST TECHNIQUE: Contiguous axial images were obtained from the base of the skull through the vertex without intravenous contrast. COMPARISON:  None. FINDINGS: BRAIN: No intraparenchymal hemorrhage, mass effect nor midline shift. The ventricles and sulci are normal for age. Patchy supratentorial white matter hypodensities within normal range for patient's age, though non-specific are most compatible with chronic small vessel ischemic disease. Focal RIGHT insular blurring of gray-white matter junction. Cystic LEFT thalamus lacunar infarct. RIGHT basal ganglia lacunar perivascular space, less likely lacunar infarct. No acute large vascular territory infarcts. No abnormal extra-axial fluid collections. Basal cisterns are patent. VASCULAR: Moderate calcific atherosclerosis of the carotid siphons. No dense MCA. SKULL: No skull fracture. Small LEFT parietal scalp hematoma without subcutaneous gas radiopaque foreign bodies. SINUSES/ORBITS: The mastoid air-cells and included paranasal sinuses are well-aerated.The included ocular globes and orbital contents are non-suspicious. OTHER: None. ASPECTS [facility] Stroke Program Early CT Score) - Ganglionic level infarction (caudate, lentiform nuclei, internal capsule, insula, M1-M3 cortex): 6 - Supraganglionic infarction (M4-M6 cortex): 3 Total score (0-10 with 10 being normal): 9 IMPRESSION: 1. RIGHT insular age-indeterminate infarct. RIGHT basal ganglia perivascular space versus lacunar infarct. 2. ASPECTS is 9 3. Mild chronic small vessel ischemic disease and old LEFT thalamus lacunar infarct. 4. Critical Value/emergent results were called by telephone at the time of interpretation on 08/11/2017 at 5:02 pm to Dr. Leonel Ramsay, Neurology , who verbally acknowledged these results. Electronically Signed   By: Elon Alas M.D.   On: 08/11/2017 17:06    Procedures Procedures (including critical care  time)  Medications Ordered in ED Medications - No data to display   Initial Impression / Assessment and Plan / ED Course  I have reviewed the triage vital signs and the nursing notes.  Pertinent labs & imaging results that were available during my care of the patient were reviewed by me and considered in my medical decision making (see chart for details).     Not tpa candidate. CT head shows right insular stroke, which Correlates with exam  findings of left facial droop and left pronator drift. Was seen by Dr. Leonel Ramsay.  Blood work notable for mildly elevated troponin, likely related to the stroke. EKG is nonischemic and he is without chest pain or ACS symptoms.  I discussed with Dr. Roel Cluck  who will admit for stroke workup.  Final Clinical Impressions(s) / ED Diagnoses   Final diagnoses:  Acute CVA (cerebrovascular accident) [facility])    New Prescriptions New Prescriptions   No medications on file     Forde Dandy, MD 08/11/17 [phone]

## 2017-08-11 NOTE — ED Notes (Signed)
Patient transported to X-ray and then to MRI .

## 2017-08-11 NOTE — ED Notes (Signed)
Elevated Trop reported to Dr. Rene Kocher.

## 2017-08-11 NOTE — ED Notes (Signed)
PT accompanied to CT with RN

## 2017-08-11 NOTE — ED Notes (Signed)
Dr. Marvis Repress paged by secretary to update on pt.'s elevated Troponin result .

## 2017-08-11 NOTE — ED Notes (Signed)
Dr. Dimitri Ped notified on pt.'s elevated Troponin result .

## 2017-08-11 NOTE — ED Notes (Signed)
Attempted to call report

## 2017-08-11 NOTE — ED Notes (Signed)
Admitting MD at bedside.

## 2017-08-11 NOTE — ED Triage Notes (Signed)
Spouse brought patient in due to weakness, patient fell at 0800, unable to eat properly - food falling out of mouth per spouse.

## 2017-08-11 NOTE — Consult Note (Addendum)
Neurology Consultation Reason for Consult: Left Sided Weakness Referring Physician: Oleta Mouse, D  CC: Left sided weakness  History is obtained from:patient  HPI: Samuel Walls is a 76 y.o. male who first noticed that he was having some difficulty going upstairs around 9:30am. He did not notice facial droop or slurred speech, but his wife did notice it around 10am. He subsequently came into the ER this afternoon for further evaluation. At the time of his arrival, he is outside the IV TPA window, a code stroke was activated though he was VAN negative.   On my evaluation, he had left-sided weakness, but no signs of large vessel infarction.   LKW: 9 am tpa given?: no, out of window.  NIHSS: 4  ROS: A 14 point ROS was performed and is negative except as noted in the HPI.   Past Medical History:  Diagnosis Date  . BPH (benign prostatic hyperplasia)   . CAD (coronary artery disease) cardiologist-  dr Joellyn Quails   a.  NSTEMI  05-31-2012  s/p  DES to pLAD  . Chronic sinus bradycardia   . History of acute congestive heart failure    [phone]  diastolic and systolic in setting NSTEMI  . History of non-ST elevation myocardial infarction (NSTEMI)    07/ 2013   s/p  DES to LAD  . Hyperlipidemia LDL goal < 70   . Ischemic cardiomyopathy    07/ 2013  per cardiac cath ef 35%/  last echo 11-29-2013  improved at 50%  . Nocturia   . 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 S/P drug eluting coronary stent placement    05-31-2012   DES x1  to pLAD  . Wears glasses      Family History  Problem Relation Age of Onset  . ALS Mother   . Other Father   . Cancer Neg Hx      Social History:  reports that he has never smoked. He has never used smokeless tobacco. He reports that he does not drink alcohol or use drugs.   Exam: Current vital signs: BP 135/86 (BP Location: Left Arm)   Pulse (!) 55   Temp 98.2 F (36.8 C) (Oral)   Resp 18    SpO2 100%  Vital signs in last 24 hours: Temp:  [98.2 F (36.8 C)] 98.2 F (36.8 C) (09/24 1617) Pulse Rate:  [55] 55 (09/24 1617) Resp:  [18] 18 (09/24 1617) BP: (135)/(86) 135/86 (09/24 1617) SpO2:  [100 %] 100 % (09/24 1617)   Physical Exam  Constitutional: Appears well-developed and well-nourished.  Psych: Affect appropriate to situation Eyes: No scleral injection HENT: No OP obstrucion Head: Normocephalic.  Cardiovascular: Normal rate and regular rhythm.  Respiratory: Effort normal and breath sounds normal to anterior ascultation GI: Soft.  No distension. There is no tenderness.  Skin: WDI  Neuro: Mental Status: Patient is awake, alert, oriented to person, place, month, year, and situation. Patient is able to give a clear and coherent history. No signs of aphasia  No extinction to DSS with light touch or visual stimuli.  Cranial Nerves: II: Visual Fields are full. Pupils are equal, round, and reactive to light.   III,IV, VI: EOMI without ptosis or diploplia.  V: Facial sensation is symmetric to temperature VII: Facial movement with decreased  VIII: hearing is intact to voice X: Uvula elevates symmetrically XI: Shoulder shrug is symmetric. XII: tongue is midline  without atrophy or fasciculations.  Motor: Tone is normal. Bulk is normal. 4/5 strength of the left arm or leg.  Sensory: Sensation is symmetric to light touch and temperature in the arms and legs. Cerebellar: FNF and HKS are intact on the right, consistent with weakness on the left.   I have reviewed the images obtained: CT head - negative  Impression: 76 yo M with left sided weakness most consistent with small ischemic infarct. He is going to be  Admitted for further evaluation.  Recommendations: 1. HgbA1c, fasting lipid panel 2. MRI, MRA  of the brain without contrast 3. Frequent neuro checks 4. Echocardiogram 5. Carotid dopplers 6. Prophylactic therapy-Antiplatelet med: Aspirin - dose 325mg  PO  or 300mg  PR 7. Risk factor modification 8. Telemetry monitoring 9. PT consult, OT consult, Speech consult 10. please page stroke NP  Or  PA  Or MD  from 8am -4 pm as this patient will be followed by the stroke team at this point.   You can look them up on [email or web address]      Roland Rack, MD [facility] [phone]  If 7pm- 7am, please page neurology on call as listed in [place].

## 2017-08-11 NOTE — H&P (Signed)
Samuel Walls XBD:[id number] DOB: [birth year] DOA: 08/11/2017     PCP: Nicoletta Dress, MD   Outpatient Specialists: Cardiology [facility] Patient coming from:   home Lives With family   Chief Complaint: Left-sided weakness  HPI: Samuel Walls is a 76 y.o. male with medical history significant of CAD, sinus bradycardia,  HLD, ischemic cardiomyopathy chronic systolic CHF, prostate cancer sp seed implantation    Presented with weakness difficulty with ambulation, He had fall at [place] falling out of his mouth since AM patient had been having left facial and upper and lower extremity weakness denies any chest pain or shortness of breath no nausea vomiting have had some slurred speech but hours no mental status changes. He initially did not want to come to the ER to be evaluated.   in ER code stroke called Dr. Leonel Ramsay with neurology have seen patient. Patient is out of the window for TPA  Regarding pertinent Chronic problems: No prior history of CVA has known history of coronary artery disease sp NSTEMI sp DES in 2013  and ischemic cardiomyopathy with echo gram 2015 showing EF 50%  IN ER:  Temp (24hrs), Avg:98.2 F (36.8 C), Min:98.2 F (36.8 C), Max:98.2 F (36.8 C)      on arrival  ED Triage Vitals  Enc Vitals Group     BP 08/11/17 1617 135/86     Pulse Rate 08/11/17 1617 (!) 55     Resp 08/11/17 1617 18     Temp 08/11/17 1617 98.2 F (36.8 C)     Temp Source 08/11/17 1617 Oral     SpO2 08/11/17 1617 100 %     Weight 08/11/17 1726 196 lb 10.4 oz (89.2 kg)     Height 08/11/17 1726 5\' 11"  (1.803 m)     Head Circumference --      Peak Flow --      Pain Score --      Pain Loc --      Pain Edu? --      Excl. in GC? --     Latest Na 146 K 3.5 BUN 28 Cr 1.58 Hg 13.6 Trop 0.23 INR 1.08 8.2 Hg 12.4 plt 202 CT Right Insular infract age indeterminant right basal ganglia perivascular space versus INFARCT.   Following Medications were ordered in ER: Medications - No data  to display   ER provider discussed case with: Neurology Dr. Leonel Ramsay who recommends: Admitted to medicine for CVA workup  We'll see patient in consult in ER    Hospitalist was called for admission for CVA workup  Review of Systems:    Pertinent positives include: localizing neurological complaints, weakness gait abnormality, slurred speech,  Constitutional:  No weight loss, night sweats, Fevers, chills, fatigue, weight loss  HEENT:  No headaches, Difficulty swallowing,Tooth/dental problems,Sore throat,  No sneezing, itching, ear ache, nasal congestion, post nasal drip,  Cardio-vascular:  No chest pain, Orthopnea, PND, anasarca, dizziness, palpitations.no Bilateral lower extremity swelling  GI:  No heartburn, indigestion, abdominal pain, nausea, vomiting, diarrhea, change in bowel habits, loss of appetite, melena, blood in stool, hematemesis Resp:  no shortness of breath at rest. No dyspnea on exertion, No excess mucus, no productive cough, No non-productive cough, No coughing up of blood.No change in color of mucus.No wheezing. Skin:  no rash or lesions. No jaundice GU:  no dysuria, change in color of urine, no urgency or frequency. No straining to urinate.  No flank pain.  Musculoskeletal:  No joint pain or  no joint swelling. No decreased range of motion. No back pain.  Psych:  No change in mood or affect. No depression or anxiety. No memory loss.  Neuro:  no tingling,   no double vision,   confusion  As per HPI otherwise 10 point review of systems negative.   Past Medical History: Past Medical History:  Diagnosis Date  . BPH (benign prostatic hyperplasia)   . CAD (coronary artery disease) cardiologist-  dr Joellyn Quails   a.  NSTEMI  05-31-2012  s/p  DES to pLAD  . Chronic sinus bradycardia   . History of acute congestive heart failure    [id number]  diastolic and systolic in setting NSTEMI  . History of non-ST elevation myocardial infarction (NSTEMI)    07/ 2013    s/p  DES to LAD  . Hyperlipidemia LDL goal < 70   . Ischemic cardiomyopathy    07/ 2013  per cardiac cath ef 35%/  last echo 11-29-2013  improved at 50%  . Nocturia   . 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 S/P drug eluting coronary stent placement    05-31-2012   DES x1  to pLAD  . Wears glasses    Past Surgical History:  Procedure Laterality Date  . ANKLE CLOSED REDUCTION Left 2000  . LEFT HEART CATHETERIZATION WITH CORONARY ANGIOGRAM N/A 05/31/2012   Procedure: LEFT HEART CATHETERIZATION WITH CORONARY ANGIOGRAM;  Surgeon: Sherren Mocha, MD;  Location: [facility] CATH LAB;  Service: Cardiovascular;  Laterality: N/A;  PCI w/ DES x1 to pLAD (95%);  moderate stenoses of RCA and CLx;  moderate severe segmental LV dysfunction, ef 35%  . PROSTATE BIOPSY    . RADIOACTIVE SEED IMPLANT N/A 01/02/2017   Procedure: RADIOACTIVE SEED IMPLANT/BRACHYTHERAPY IMPLANT;  Surgeon: Joie Bimler, MD;  Location: [facility];  Service: Urology;  Laterality: N/A;  . TRANSTHORACIC ECHOCARDIOGRAM  11/29/2013   midl focal basal LVH of the septum,  EF 50%, akinesis of the mid-distalanteroseptal and apical myocardium,  grade 1 diastolic dysfunction/  mild LAE/  mild dilated aortic root/  trivial MR and TR     Social History:  Ambulatory  independently      reports that he has never smoked. He has never used smokeless tobacco. He reports that he does not drink alcohol or use drugs.  Allergies:   Allergies  Allergen Reactions  . Hydrocodone Other (See Comments)    vicodin --"makes me crazy"       Family History:   Family History  Problem Relation Age of Onset  . ALS Mother   . Other Father   . Cancer Neg Hx     Medications: Prior to Admission medications   Medication Sig Start Date End Date Taking? Authorizing Provider  amLODipine (NORVASC) 2.5 MG tablet Take 2.5 mg by mouth daily.   Yes [provider]  atorvastatin  (LIPITOR) 40 MG tablet Take 40 mg by mouth daily.   Yes [provider]  losartan (COZAAR) 100 MG tablet TAKE 1 TABLET BY MOUTH ONCE DAILY 07/24/17  Yes Jerline Pain, MD  Multiple Vitamin (MULTIVITAMIN WITH MINERALS) TABS Take 1 tablet by mouth every morning.    Yes [provider]  nitroGLYCERIN (NITROSTAT) 0.4 MG SL tablet DISSOLVE ONE TABLET UNDER THE TONGUE EVERY 5 MINUTES AS NEEDED FOR CHEST PAIN.  DO NOT EXCEED A TOTAL OF 3 DOSES IN 15 MINUTES 06/18/16  Yes Jerline Pain, MD  omega-3 acid ethyl esters (LOVAZA) 1 G capsule Take 2 g by mouth every evening.    Yes [provider]    Physical Exam: Patient Vitals for the past 24 hrs:  BP Temp Temp src Pulse Resp SpO2 Height Weight  08/11/17 1726 - - - - - - 5\' 11"  (1.803 m) 89.2 kg (196 lb 10.4 oz)  08/11/17 1617 135/86 98.2 F (36.8 C) Oral (!) 55 18 100 % - -    1. General:  in No Acute distress  well  - appearing 2. Psychological: Alert and   Oriented 3. Head/ENT:     Dry Mucous Membranes                          Head Non traumatic, neck supple                           Poor Dentition 4. SKIN:  decreased Skin turgor,  Skin clean Dry and intact no rash 5. Heart: Regular rate and rhythm no  Murmur, no Rub or gallop 6. Lungs:  Coarse but no wheezes or crackles   7. Abdomen: Soft,  non-tender, Non distended bowel sounds present 8. Lower extremities: no clubbing, cyanosis, or edema 9. Neurologically  Strength diminished on the left  cranial nerves II through XII intact  Left side droop 10. MSK: Normal range of motion   body mass index is 27.43 kg/m.  Labs on Admission:   Labs on Admission: I have personally reviewed following labs and imaging studies  CBC:  Recent Labs Lab 08/11/17 1722 08/11/17 1727  WBC 8.2  --   NEUTROABS 5.4  --   HGB 12.4* 13.6  HCT 38.3* 40.0  MCV 91.6  --   PLT 202  --    Basic Metabolic Panel:  Recent Labs Lab 08/11/17 1722 08/11/17 1727  NA 143 146*  K 3.5  3.5  CL 110 107  CO2 27  --   GLUCOSE 127* 125*  BUN 27* 28*  CREATININE 1.58* 1.50*  CALCIUM 9.4  --    GFR: Estimated Creatinine Clearance: 45.3 mL/min (A) (by C-G formula based on SCr of 1.5 mg/dL (H)). Liver Function Tests:  Recent Labs Lab 08/11/17 1722  AST 34  ALT 20  ALKPHOS 114  BILITOT 1.0  PROT 7.1  ALBUMIN 3.8   No results for input(s): LIPASE, AMYLASE in the last 168 hours. No results for input(s): AMMONIA in the last 168 hours. Coagulation Profile:  Recent Labs Lab 08/11/17 1722  INR 1.08   Cardiac Enzymes: No results for input(s): CKTOTAL, CKMB, CKMBINDEX, TROPONINI in the last 168 hours. BNP (last 3 results) No results for input(s): PROBNP in the last 8760 hours. HbA1C: No results for input(s): HGBA1C in the last 72 hours. CBG: No results for input(s): GLUCAP in the last 168 hours. Lipid Profile: No results for input(s): CHOL, HDL, LDLCALC, TRIG, CHOLHDL, LDLDIRECT in the last 72 hours. Thyroid Function Tests: No results for input(s): TSH, T4TOTAL, FREET4, T3FREE, THYROIDAB in the last 72 hours. Anemia Panel: No results for input(s): VITAMINB12, FOLATE, FERRITIN, TIBC, IRON, RETICCTPCT in the last 72 hours. Urine analysis:  Sepsis Labs: @LABRCNTIP (procalcitonin:4,lacticidven:4) )No results found for this or any previous visit (from the past 240 hour(s)).    UA  not ordered  Lab Results  Component Value Date   HGBA1C 5.6 05/31/2012    Estimated  Creatinine Clearance: 45.3 mL/min (A) (by C-G formula based on SCr of 1.5 mg/dL (H)).  BNP (last 3 results) No results for input(s): PROBNP in the last 8760 hours.   ECG REPORT  Independently reviewed Rate: 50  Rhythm: Sinus bradycardia ST&T Change: No acute ischemic changes   QTC 404  Filed Weights   08/11/17 1726  Weight: 89.2 kg (196 lb 10.4 oz)     Cultures: No results found for: SDES, SPECREQUEST, CULT, REPTSTATUS   Radiological Exams on Admission: Ct Head Code Stroke Wo  Contrast  Result Date: 08/11/2017 CLINICAL DATA:  Code stroke. Slurred speech. Ataxia. Suspect stroke. History of hyperlipidemia . EXAM: CT HEAD WITHOUT CONTRAST TECHNIQUE: Contiguous axial images were obtained from the base of the skull through the vertex without intravenous contrast. COMPARISON:  None. FINDINGS: BRAIN: No intraparenchymal hemorrhage, mass effect nor midline shift. The ventricles and sulci are normal for age. Patchy supratentorial white matter hypodensities within normal range for patient's age, though non-specific are most compatible with chronic small vessel ischemic disease. Focal RIGHT insular blurring of gray-white matter junction. Cystic LEFT thalamus lacunar infarct. RIGHT basal ganglia lacunar perivascular space, less likely lacunar infarct. No acute large vascular territory infarcts. No abnormal extra-axial fluid collections. Basal cisterns are patent. VASCULAR: Moderate calcific atherosclerosis of the carotid siphons. No dense MCA. SKULL: No skull fracture. Small LEFT parietal scalp hematoma without subcutaneous gas radiopaque foreign bodies. SINUSES/ORBITS: The mastoid air-cells and included paranasal sinuses are well-aerated.The included ocular globes and orbital contents are non-suspicious. OTHER: None. ASPECTS [facility] Stroke Program Early CT Score) - Ganglionic level infarction (caudate, lentiform nuclei, internal capsule, insula, M1-M3 cortex): 6 - Supraganglionic infarction (M4-M6 cortex): 3 Total score (0-10 with 10 being normal): 9 IMPRESSION: 1. RIGHT insular age-indeterminate infarct. RIGHT basal ganglia perivascular space versus lacunar infarct. 2. ASPECTS is 9 3. Mild chronic small vessel ischemic disease and old LEFT thalamus lacunar infarct. 4. Critical Value/emergent results were called by telephone at the time of interpretation on 08/11/2017 at 5:02 pm to Dr. Leonel Ramsay, Neurology , who verbally acknowledged these results. Electronically Signed   By: Elon Alas  M.D.   On: 08/11/2017 17:06    Chart has been reviewed    Assessment/Plan   76 y.o. male with medical history significant of CAD, sinus bradycardia,  HLD, ischemic cardiomyopathy chronic systolic CHF, prostate cancer sp seed implantation  Admitted for CVA workup   Present on Admission: . CVA (cerebral vascular accident) [facility]) - will admit based on TIA/CVA protocol, await results of MRA/MRI, Carotid Doppler and Echo, obtain cardiac enzymes,  ECG,   Lipid panel, TSH. Order PT/OT evaluation. Will make sure patient is on antiplatelet agent.   Neurology consulted.   Patient did not pass swallow eval SLP ordered   . Malignant neoplasm of prostate ([place]) chronic status post radioactive seeds implantation . Hyperlipidemia chronic scarring is stable continue statin . Essential hypertension stable for now I'll I'll Permissive hypertension . CAD (coronary artery disease) - stable continue statin aspirin . Bradycardia chronic will monitor on telemetry  . Elevated troponin - no chest pain and EKG changes to suggest cardiac source suspect secondary to CVA but we'll continue to cycle and monitor on telemetry obtain echogram . Sinus bradycardia chronic patient is intolerant of beta blockers  Other plan as per orders.  DVT prophylaxis:  SCD    Code Status:  FULL CODE  as per patient    Family Communication:   Family   at  Bedside  plan of  care was discussed with wife   Disposition Plan:   likely will need placement for rehabilitation                                                 Would benefit from PT/OT eval prior to DC ordered                                              Consults called: Neurology     Admission status:   inpatient       Level of care  tele         I have spent a total of 56 min on this admission    Resha Filippone 08/11/2017, 8:19 PM    [facility]  Pager [phone]   after 2 AM please page floor coverage PA If 7AM-7PM, please contact the day team taking  care of the patient  [email or web address]  Password [email or web address]

## 2017-08-11 NOTE — ED Notes (Signed)
Placed on hospital bed at this time 

## 2017-08-12 ENCOUNTER — Inpatient Hospital Stay (HOSPITAL_COMMUNITY): Payer: PPO

## 2017-08-12 ENCOUNTER — Encounter (HOSPITAL_COMMUNITY): Payer: Self-pay | Admitting: General Practice

## 2017-08-12 DIAGNOSIS — I639 Cerebral infarction, unspecified: Secondary | ICD-10-CM

## 2017-08-12 DIAGNOSIS — I361 Nonrheumatic tricuspid (valve) insufficiency: Secondary | ICD-10-CM

## 2017-08-12 DIAGNOSIS — I340 Nonrheumatic mitral (valve) insufficiency: Secondary | ICD-10-CM

## 2017-08-12 DIAGNOSIS — I6340 Cerebral infarction due to embolism of unspecified cerebral artery: Secondary | ICD-10-CM

## 2017-08-12 LAB — LIPID PANEL
CHOL/HDL RATIO: 2.7 ratio
Cholesterol: 101 mg/dL (ref 0–200)
HDL: 38 mg/dL — AB (ref >40)
LDL CALC: 45 mg/dL (ref 0–99)
Triglycerides: 90 mg/dL (ref <150)
VLDL: 18 mg/dL (ref 0–40)

## 2017-08-12 LAB — ETHANOL: Alcohol, Ethyl (B): <5 mg/dL (ref <5)

## 2017-08-12 LAB — ECHOCARDIOGRAM COMPLETE
Height: 72 in
Weight: 3241.64 oz

## 2017-08-12 LAB — TROPONIN I
Troponin I: 0.33 ng/mL (ref <0.03)
Troponin I: 0.24 ng/mL (ref <0.03)

## 2017-08-12 LAB — HEMOGLOBIN A1C
HEMOGLOBIN A1C: 5.7 % — AB (ref 4.8–5.6)
Mean Plasma Glucose: 116.89 mg/dL

## 2017-08-12 NOTE — Progress Notes (Signed)
STROKE TEAM PROGRESS NOTE   HISTORY OF PRESENT ILLNESS (per record) Samuel Walls is a 75 y.o. male with a history of CAD with prior NSTEMI and drug-eluting stent, chronic sinus bradycardia, and hyperlipidemia who presents with facial droop, slurred speech, and difficulty climbing stairs due to left-sided weakness.  He first noticed that he was having some difficulty going upstairs around 9:30am on 08/11/2017.  He did not notice facial droop or slurred speech, but his wife did notice it around 10am. He subsequently came into the ER this afternoon for further evaluation. At the time of his arrival, he is outside the IV TPA window, a code stroke was activated though he was VAN negative.  LKW: 9 am on 08/11/2017 NIHSS: 4  Patient was not administered IV t-PA secondary to arriving outside of the tPA treatment window. He was admitted to General Neurology for further evaluation and treatment.   SUBJECTIVE (INTERVAL HISTORY) His wife is at the bedside.  The patient is awake, alert, and follows all commands appropriately. No neurological deficit at this moment. Patient recovered well. However, due to embolic pattern stroke, will need TEE and loop recorder.   OBJECTIVE Temp:  [98 F (36.7 C)-99 F (37.2 C)] 98.2 F (36.8 C) (09/25 1553) Pulse Rate:  [39-57] 46 (09/25 1553) Cardiac Rhythm: Sinus bradycardia (09/25 0815) Resp:  [11-19] 15 (09/25 0815) BP: (132-160)/(70-86) 156/77 (09/25 1553) SpO2:  [94 %-100 %] 100 % (09/25 0815) Weight:  [89.2 kg (196 lb 10.4 oz)-91.9 kg (202 lb 9.6 oz)] 91.9 kg (202 lb 9.6 oz) (09/25 0500)  CBC:   Recent Labs Lab 08/11/17 1722 08/11/17 1727  WBC 8.2  --   NEUTROABS 5.4  --   HGB 12.4* 13.6  HCT 38.3* 40.0  MCV 91.6  --   PLT 202  --     Basic Metabolic Panel:   Recent Labs Lab 08/11/17 1722 08/11/17 1727  NA 143 146*  K 3.5 3.5  CL 110 107  CO2 27  --   GLUCOSE 127* 125*  BUN 27* 28*  CREATININE 1.58* 1.50*  CALCIUM 9.4  --     Lipid  Panel:     Component Value Date/Time   CHOL 101 08/12/2017 0233   TRIG 90 08/12/2017 0233   HDL 38 (L) 08/12/2017 0233   CHOLHDL 2.7 08/12/2017 0233   VLDL 18 08/12/2017 0233   LDLCALC 45 08/12/2017 0233   HgbA1c:  Lab Results  Component Value Date   HGBA1C 5.7 (H) 08/12/2017   Urine Drug Screen:     Component Value Date/Time   LABOPIA NONE DETECTED 08/11/2017 2308   COCAINSCRNUR NONE DETECTED 08/11/2017 2308   LABBENZ NONE DETECTED 08/11/2017 2308   AMPHETMU NONE DETECTED 08/11/2017 2308   THCU NONE DETECTED 08/11/2017 2308   LABBARB NONE DETECTED 08/11/2017 2308    Alcohol Level     Component Value Date/Time   ETH <5 08/12/2017 0233    IMAGING I have personally reviewed the radiological images below and agree with the radiology interpretations.  Dg Chest 2 View 08/11/2017 IMPRESSION: No acute abnormality.  Borderline cardiomegaly.   Mr Brain 59 Contrast Mr Jodene Nam Head Wo Contrast 08/11/2017 IMPRESSION: 1. Acute ischemic infarct of the right insular cortex without associated hemorrhage or mass effect. 2. Additional scattered punctate foci of acute ischemia within the right middle cerebellar peduncle the superior right frontal and parietal white matter. 3. Multiple old cerebellar and bilateral basal ganglia infarcts. Chronic ischemic microangiopathy. 4. No emergent large vessel occlusion or  high grade stenosis.   Ct Head Code Stroke Wo Contrast 08/11/2017 IMPRESSION: 1. RIGHT insular age-indeterminate infarct. RIGHT basal ganglia perivascular space versus lacunar infarct. 2. ASPECTS is 9 3. Mild chronic small vessel ischemic disease and old LEFT thalamus lacunar infarct.   Lower Extremity US (DVT) 08/12/2017 Summary: - No evidence of deep vein thrombosis involving the visualized veins of the right lower extremity. - No evidence of deep vein thrombosis involving the visualized veins of the left lower extremity.  Carotid US 08/12/2017 Preliminary findings: Bilateral  1-39% ICA stenosis, antegrade vertebral flow.  TTE 08/12/2017 Study Conclusions - Left ventricle: The cavity size was normal. Wall thickness was increased in a pattern of mild LVH. Systolic function was mildly reduced. The estimated ejection fraction was in the range of 45% to 50%. There is akinesis of the apical myocardium. Doppler parameters are consistent with abnormal left ventricular relaxation (grade 1 diastolic dysfunction). - Mitral valve: There was mild regurgitation. - Left atrium: The atrium was moderately dilated. - Right ventricle: The cavity size was mildly dilated. - Atrial septum: There was an atrial septal aneurysm. Impressions: - Apical akinesis with overall mildly reduced LV systolic function; mild LVH; mild diastolic dysfunction; mild MR; moderate LAE; mild RVE; atrial septal aneurysm.  TEE pending   PHYSICAL EXAM  Temp:  [98 F (36.7 C)-99 F (37.2 C)] 98.5 F (36.9 C) (09/25 2042) Pulse Rate:  [39-54] 47 (09/25 2042) Resp:  [15-18] 18 (09/25 2042) BP: (132-159)/(70-77) 159/71 (09/25 2042) SpO2:  [96 %-100 %] 97 % (09/25 2042) Weight:  [197 lb 15.6 oz (89.8 kg)-202 lb 9.6 oz (91.9 kg)] 202 lb 9.6 oz (91.9 kg) (09/25 0500)  General - Well nourished, well developed, in no apparent distress.  Ophthalmologic - Sharp disc margins OU.   Cardiovascular - Regular rate and rhythm.  Mental Status -  Level of arousal and orientation to time, place, and person were intact. Language including expression, naming, repetition, comprehension was assessed and found intact. Attention span and concentration were normal. Fund of Knowledge was assessed and was intact.  Cranial Nerves II - XII - II - Visual field intact OU. III, IV, VI - Extraocular movements intact. V - Facial sensation intact bilaterally. VII - Facial movement intact bilaterally. VIII - Hearing & vestibular intact bilaterally. X - Palate elevates symmetrically. XI - Chin turning & shoulder shrug intact  bilaterally. XII - Tongue protrusion intact.  Motor Strength - The patient's strength was normal in all extremities and pronator drift was absent.  Bulk was normal and fasciculations were absent.   Motor Tone - Muscle tone was assessed at the neck and appendages and was normal.  Reflexes - The patient's reflexes were 1+ in all extremities and he had no pathological reflexes.  Sensory - Light touch, temperature/pinprick, vibration and proprioception, and Romberg testing were assessed and were symmetrical.    Coordination - The patient had normal movements in the hands and feet with no ataxia or dysmetria.  Tremor was absent.  Gait and Station - The patient's transfers, posture, gait, station, and turns were observed as normal.   ASSESSMENT/PLAN [name] is a 76 y.o. male with history of CAD with prior NSTEMI and drug-eluting stent, chronic sinus bradycardia, and hyperlipidemia who presents with facial droop, slurred speech, and difficulty climbing stairs due to left-sided weakness. He did not receive IV t-PA due to arriving outside of the tPA treatment windw.   Stroke:  Acute ischemic R insular cortex and scattered punctate R middle  cerebellar peduncle, as well as subacute R superior frontal, and parietal white matter infarcts, embolic pattern, etiology unclear, concerning for cardioembolic events  Resultant  no neuro deficit  CT head: RIGHT insular age-indeterminate infarct. RIGHT basal ganglia perivascular space versus lacunar infarct.  MRI head: Acute ischemic R insular cortex and scattered punctate R middle cerebellar peduncle, and subacute R superior frontal, and parietal white matter infarcts  MRA head: No LVO or high-grade stenosis  Carotid Doppler: B ICA 1-39% stenosis, VAs antegrade  2D Echo: Apical akinesis, EF 45-50%  LE venous Doppler negative for DVT  Recommend TEE and loop recorder for cardioembolic workup  LDL 45  [id number] 5.7  UDS negative  SCDs for VTE  prophylaxis Diet Heart Room service appropriate? Yes; Fluid consistency: Thin Diet NPO time specified  No antithrombotic prior to admission, now on aspirin 325 mg daily.  Patient counseled to be compliant with his antithrombotic medications  Ongoing aggressive stroke risk factor management  Therapy recommendations: None  Disposition: pending  CAD  S/p stent  History of NSTEMI  History of cardiomyopathy, EF improved from 35% to 50%  2-D echo showed apical akinesis with EF 45-50%  Recommend TEE and loop recorder  Hypertension  Stable  Permissive hypertension (OK if < 220/120) but gradually normalize in 5-7 days  Long-term BP goal normotensive  Hyperlipidemia  Home meds:  Atorvastatin 40 mg PO daily, resumed in hospital  LDL 45, goal < 70  Continue statin at discharge  Other Stroke Risk Factors  Advanced age  Other Active Problems  Elevated troponin, downtrending: 0.27->0.33->0.24  Prostate cancer  CKD stage II, 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 After hours, contact General Neurology

## 2017-08-12 NOTE — Progress Notes (Signed)
*  PRELIMINARY RESULTS* Vascular Ultrasound Carotid Duplex (Doppler) has been completed.  Preliminary findings: Bilateral 1-39% ICA stenosis, antegrade vertebral flow.   Everrett Coombe 08/12/2017, 2:37 PM

## 2017-08-12 NOTE — Consult Note (Signed)
[facility] CM Primary Care Navigator  08/12/2017  Samuel Walls 1941-01-18 [id number]   Met with patient, wife Samuel Walls) and other family membersat the bedside to identify possible discharge needs. Patient gave permission to discuss health information in their presence. Patientreports having left side weakness, dizziness/ lightheadedness and fall thathad led to this admission.  Patient endorses thatprimary care provider is Dr. Nelda Bucks with [facility] in [place].  He states using [address] in [place] to obtain medications without difficulty. Patient reports managing his medications at home using "pill box" system filled weekly. Patient states that he drives prior to admission, however, his wife or sons Samuel Walls and Samuel Walls) will be able to providetransportation to his doctor's appointments after discharge.  Patient's wife will be his primary caregiver at home as stated.  Anticipated discharge plan is home according to patient.  Patient and wifeexpressed understanding to call primary care provider's office when he getshome, for a post discharge follow-up appointment within a week or sooner if needed. Patient letter (with PCP's contact number) was provided as a reminder.  Explained to patient and family about [facility] CM services available for health management at home. Patient and wife denied current needs or concerns at this point. He verbally agreed for [facility] strokecalls tofollow-up at home as he recovers.  Noted order for EMMI stroke calls after discharge already in place.  Patient and wife voiced understandingto seek referral from primary care provider to [facility] care management ifnecessaryand deemed appropriate for services in the future.  [facility] care management information provided for future needs that he may have.   For questions, please contact:  Dannielle Huh, BSN, RN- [facility] Primary Care Navigator  Telephone: [phone] [place]

## 2017-08-12 NOTE — Evaluation (Signed)
Physical Therapy Evaluation Patient Details Name: Samuel Walls MRN: [id number] DOB: Jul 24, 1941 Today's Date: 08/12/2017   History of Present Illness  Pt is a 76 y/o male who presents with L sided weakness and slurred speech. Imaging revealed acute R-sided infarct and some additional scattered infarcts, both acute and chronic. PMH significant of CAD, sinus bradycardia,  HLD, ischemic cardiomyopathy chronic systolic CHF, prostate cancer sp seed implantation.   Clinical Impression  Patient evaluated by Physical Therapy with no further acute PT needs identified. All education has been completed and the patient has no further questions. At the time of PT eval pt was able to perform transfers and ambulation with independence. Pt reports he is at baseline of function and all symptoms have completely resolved. See below for any follow-up Physical Therapy or equipment needs. PT is signing off - if needs change, please reconsult. Thank you for this referral.     Follow Up Recommendations No PT follow up;Supervision - Intermittent    Equipment Recommendations  None recommended by PT    Recommendations for Other Services       Precautions / Restrictions Precautions Precautions: None Restrictions Weight Bearing Restrictions: No      Mobility  Bed Mobility               General bed mobility comments: Pt received ambulating independently with family in hall. Bed mobility not performed at this time.   Transfers Overall transfer level: Independent Equipment used: None Transfers: Sit to/from Stand              Ambulation/Gait Ambulation/Gait assistance: Independent Ambulation Distance (Feet): 150 Feet Assistive device: None Gait Pattern/deviations: WFL(Within Functional Limits)   Gait velocity interpretation: at or above normal speed for age/gender    Stairs            Wheelchair Mobility    Modified Rankin (Stroke Patients Only) Modified Rankin (Stroke Patients  Only) Pre-Morbid Rankin Score: No symptoms Modified Rankin: No symptoms     Balance Overall balance assessment: No apparent balance deficits (not formally assessed)                                           Pertinent Vitals/Pain Pain Assessment: No/denies pain    Home Living Family/patient expects to be discharged to:: Private residence Living Arrangements: Spouse/significant other Available Help at Discharge: Family;Available 24 hours/day Type of Home: House Home Access: Stairs to enter   [employer] of Steps: 1 Home Layout: One level Home Equipment: Shower seat - built in;Walker - 2 wheels;Cane - single point      Prior Function Level of Independence: Independent         Comments: Drives - Estate [employer]   Dominant Hand: Right    Extremity/Trunk Assessment   Upper Extremity Assessment Upper Extremity Assessment: Overall WFL for tasks assessed (5/5 strength bilaterally. No N/T reported)    Lower Extremity Assessment Lower Extremity Assessment: Overall WFL for tasks assessed (5/5 strength bilaterally. No N/T reported)    Cervical / Trunk Assessment Cervical / Trunk Assessment: Normal  Communication      Cognition Arousal/Alertness: Awake/alert Behavior During Therapy: WFL for tasks assessed/performed Overall Cognitive Status: Within Functional Limits for tasks assessed  General Comments      Exercises     Assessment/Plan    PT Assessment Patent does not need any further PT services  PT Problem List         PT Treatment Interventions      PT Goals (Current goals can be found in the Care Plan section)  Acute Rehab PT Goals Patient Stated Goal: Home now PT Goal Formulation: All assessment and education complete, DC therapy    Frequency     Barriers to discharge        Co-evaluation               AM-PAC PT "6 Clicks" Daily  Activity  Outcome Measure Difficulty turning over in bed (including adjusting bedclothes, sheets and blankets)?: None Difficulty moving from lying on back to sitting on the side of the bed? : None Difficulty sitting down on and standing up from a chair with arms (e.g., wheelchair, bedside commode, etc,.)?: None Help needed moving to and from a bed to chair (including a wheelchair)?: None Help needed walking in hospital room?: None Help needed climbing 3-5 steps with a railing? : None 6 Click Score: 24    End of Session   Activity Tolerance: Patient tolerated treatment well Patient left: with family/visitor present (Sitting EOB with multiple family members present) Nurse Communication: Mobility status PT Visit Diagnosis: Other symptoms and signs involving the nervous system (R29.898)    Time: 1100-1115 PT Time Calculation (min) (ACUTE ONLY): 15 min   Charges:   PT Evaluation $PT Eval Low Complexity: 1 Low     PT G Codes:        Rolinda Roan, PT, DPT Acute Rehabilitation Services Pager: [phone]   Thelma Comp 08/12/2017, 11:32 AM

## 2017-08-12 NOTE — Progress Notes (Signed)
*  PRELIMINARY RESULTS* Vascular Ultrasound Bilateral lower extremity venous duplex has been completed.  Preliminary findings: No evidence of deep vein thrombosis or baker's cysts in the lower extremities.   Everrett Coombe 08/12/2017, 2:30 PM

## 2017-08-12 NOTE — Progress Notes (Addendum)
Patient arrived from ED around 2330 alert and oriented X 4, all neuro deficits have resolved, telemetry is on and SCD's are ordered, family is in room with patient overnight. Patient did not pass or was not given stroke swallow assessment and is NPO until SLP performs swallow evaluation in AM and sets up diet; patient is NPO now and since admission to this floor.

## 2017-08-12 NOTE — Progress Notes (Addendum)
OT Screen    08/12/17 1300  OT Visit Information  Last OT Received On 08/12/17  Reason Eval/Treat Not Completed OT screened, no needs identified, will sign off. PT reporting pt at baseline. Talked with pt and family who confirmed that pt is performing ADLs and functional mobility at baseline.  Please re-order OT evaluation if status changes. Thank you.   [place], OTR/L Acute Rehab Pager: [phone] Office: [phone]

## 2017-08-12 NOTE — Progress Notes (Signed)
  Echocardiogram 2D Echocardiogram has been performed.  Jannett Celestine 08/12/2017, 3:56 PM

## 2017-08-12 NOTE — Evaluation (Signed)
Clinical/Bedside Swallow Evaluation Patient Details  Name: Jeter Tomey MRN: [id number] Date of Birth: Jun 20, 1941  Today's Date: 08/12/2017 Time: SLP Start Time (ACUTE ONLY): 0935 SLP Stop Time (ACUTE ONLY): 0943 SLP Time Calculation (min) (ACUTE ONLY): 8 min  Past Medical History:  Past Medical History:  Diagnosis Date  . BPH (benign prostatic hyperplasia)   . CAD (coronary artery disease) cardiologist-  dr Joellyn Quails   a.  NSTEMI  05-31-2012  s/p  DES to pLAD  . Chronic sinus bradycardia   . History of acute congestive heart failure    [id number]  diastolic and systolic in setting NSTEMI  . History of non-ST elevation myocardial infarction (NSTEMI)    07/ 2013   s/p  DES to LAD  . Hyperlipidemia LDL goal < 70   . Ischemic cardiomyopathy    07/ 2013  per cardiac cath ef 35%/  last echo 11-29-2013  improved at 50%  . Nocturia   . 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 S/P drug eluting coronary stent placement    05-31-2012   DES x1  to pLAD  . Wears glasses    Past Surgical History:  Past Surgical History:  Procedure Laterality Date  . ANKLE CLOSED REDUCTION Left 2000  . LEFT HEART CATHETERIZATION WITH CORONARY ANGIOGRAM N/A 05/31/2012   Procedure: LEFT HEART CATHETERIZATION WITH CORONARY ANGIOGRAM;  Surgeon: Sherren Mocha, MD;  Location: [facility] CATH LAB;  Service: Cardiovascular;  Laterality: N/A;  PCI w/ DES x1 to pLAD (95%);  moderate stenoses of RCA and CLx;  moderate severe segmental LV dysfunction, ef 35%  . PROSTATE BIOPSY    . RADIOACTIVE SEED IMPLANT N/A 01/02/2017   Procedure: RADIOACTIVE SEED IMPLANT/BRACHYTHERAPY IMPLANT;  Surgeon: Joie Bimler, MD;  Location: [facility];  Service: Urology;  Laterality: N/A;  . TRANSTHORACIC ECHOCARDIOGRAM  11/29/2013   midl focal basal LVH of the septum,  EF 50%, akinesis of the mid-distalanteroseptal and apical myocardium,  grade 1 diastolic dysfunction/  mild  LAE/  mild dilated aortic root/  trivial MR and TR   HPI:  Carrie [name] a 76 y.o.malewith medical history significant of CAD, sinus bradycardia, HLD, ischemic cardiomyopathy chronic systolic CHF, prostate cancer sp seed implantation.  Presented with weakness difficulty with ambulation, He had fall at [facility] falling out of his mouth since AM patient had been having left facial and upper and lower extremity weakness.  MRI +acute CVA.   Assessment / Plan / Recommendation Clinical Impression   Pt presents with grossly intact swallowing function.  No areas of focal oral motor weakness or decreased sensation were evident on cranial nerve exam.  Pt demonstrated adequate containment, manipulation, and clearance of boluses from the oral cavity without difficulty.  No overt s/s of aspiration were evident with solids or liquids.  Recommend that pt resume a regular diet with thin liquids, meds whole with liquids.  No further ST needs indicated at this time as pt appears to be back to his baseline for swallowing function.        Aspiration Risk  No limitations    Diet Recommendation Regular;Thin liquid   Liquid Administration via: Cup;Straw Medication Administration: Whole meds with liquid Supervision: Patient able to self feed    Other  Recommendations Oral Care Recommendations: Oral care BID   Follow up Recommendations None      Frequency and Duration  Prognosis        Swallow Study   General Date of Onset:  (see HPI) HPI: Jaxxon [name] a 76 y.o.malewith medical history significant of CAD, sinus bradycardia, HLD, ischemic cardiomyopathy chronic systolic CHF, prostate cancer sp seed implantation.  Presented with weakness difficulty with ambulation, He had fall at [place] falling out of his mouth since AM patient had been having left facial and upper and lower extremity weakness.  MRI +acute CVA. Type of Study: Bedside Swallow Evaluation Previous Swallow  Assessment: none on record Diet Prior to this Study: NPO Temperature Spikes Noted: No Respiratory Status: Room air History of Recent Intubation: No Behavior/Cognition: Alert;Cooperative;Pleasant mood Oral Cavity Assessment: Within Functional Limits Oral Care Completed by SLP: No Oral Cavity - Dentition: Adequate natural dentition Vision: Functional for self-feeding Self-Feeding Abilities: Able to feed self Patient Positioning: Upright in bed Baseline Vocal Quality: Normal Volitional Cough: Strong Volitional Swallow: Able to elicit    Oral/Motor/Sensory Function Overall Oral Motor/Sensory Function: Within functional limits   Ice Chips     Thin Liquid Thin Liquid: Within functional limits    Nectar Thick     Honey Thick     Puree     Solid   GO   Solid: Within functional limits        Morris Markham, Elmyra Ricks L 08/12/2017,10:01 AM

## 2017-08-12 NOTE — Progress Notes (Signed)
PROGRESS NOTE    Sutton Hirsch  OEU:[id number] DOB: 1941-09-11 DOA: 08/11/2017 PCP: Nicoletta Dress, MD   Chief Complaint  Patient presents with  . Weakness    Brief Narrative:  HPI on 08/11/2017 by Dr. Phillips Hay  Murad Staples is a 76 y.o. male with medical history significant of CAD, sinus bradycardia,  HLD, ischemic cardiomyopathy chronic systolic CHF, prostate cancer sp seed implantation Presented with weakness difficulty with ambulation, He had fall at [place] falling out of his mouth since AM patient had been having left facial and upper and lower extremity weakness denies any chest pain or shortness of breath no nausea vomiting have had some slurred speech but hours no mental status changes. He initially did not want to come to the ER to be evaluated.   in ER code stroke called Dr. Leonel Ramsay with neurology have seen patient. Patient is out of the window for TPA   Assessment & Plan   Acute CVA  -CT head showed right insular age indeterminate infarct, right basal ganglia perivascular space versus lacunar infarct. Old left thalamus lacunar infarct -MRI brain shows acute ischemic infarct of the right insular cortex without associated hemorrhage or mass effect. Additional scattered punctate foci of acute ischemia in the right middle cerebellar peduncle superior right frontal and parietal white matter. Multiple old cerebellar and bilateral basal ganglia infarcts. No emergent large vessel occlusion or high-grade stenosis -LDL 45, hemoglobin A1c 5.7 -Echocardiogram pending -Carotid Dopplers has bilateral 1-39% ICA stenosis, antegrade vertebral flow -Lower extremity Doppler negative for DVT -Neurology consulted and appreciated. Discussed with Dr. Erlinda Hong, recommended TEE and loop recorder. -Cardiology made aware by neurology.  Prostate cancer -Status post radioactive seed implantation  Essential hypertension -Allow for permissive hypertension at this time given acute  CVA  Hyperlipidemia -Continue statin  Coronary artery disease/mildly elevated troponin -Currently chest pain-free, continue statin, aspirin -Troponin 0.27, 0.33, 0.24, mainly flat- suspect secondary to acute CVA  Bradycardia -Stable, continue telemetry monitoring  DVT Prophylaxis  SCDs  Code Status: Full  Family Communication: Family at bedside  Disposition Plan: Admitted, pending further workup.   Consultants Neurology  Procedures  Echocardiogram Carotid doppler Lower extremity doppler  Antibiotics   Anti-infectives    None      Subjective:   Fredderick Erb seen and examined today. Patient denies any chest pain, shortness breath, bone pain, nausea vomiting, diarrhea constipation, dizziness or headache.  Objective:   Vitals:   08/12/17 0330 08/12/17 0500 08/12/17 0815 08/12/17 1553  BP: (!) 150/74  (!) 150/70 (!) 156/77  Pulse: (!) 41  (!) 42 (!) 46  Resp: 17  15   Temp: 99 F (37.2 C)  98 F (36.7 C) 98.2 F (36.8 C)  TempSrc: Oral  Oral Oral  SpO2: 96%  100%   Weight:  91.9 kg (202 lb 9.6 oz)    Height:  6' (1.829 m)      Intake/Output Summary (Last 24 hours) at 08/12/17 1601 Last data filed at 08/12/17 1218  Gross per 24 hour  Intake             1776 ml  Output                0 ml  Net             1776 ml   Filed Weights   08/11/17 1726 08/11/17 2300 08/12/17 0500  Weight: 89.2 kg (196 lb 10.4 oz) 89.8 kg (197 lb 15.6 oz) 91.9 kg (202  lb 9.6 oz)    Exam  General: Well developed, well nourished, NAD, appears stated age  HEENT: NCAT, mucous membranes moist.   Cardiovascular: S1 S2 auscultated, no rubs or gallops. Regular rate and rhythm.  Respiratory: Clear to auscultation bilaterally with equal chest rise  Abdomen: Soft, nontender, nondistended, + bowel sounds  Extremities: warm dry without cyanosis clubbing or edema  Neuro: AAOx3, left sided with decreased strength 4/5.   Psych: Normal affect and demeanor    Data Reviewed: I have  personally reviewed following labs and imaging studies  CBC:  Recent Labs Lab 08/11/17 1722 08/11/17 1727  WBC 8.2  --   NEUTROABS 5.4  --   HGB 12.4* 13.6  HCT 38.3* 40.0  MCV 91.6  --   PLT 202  --    Basic Metabolic Panel:  Recent Labs Lab 08/11/17 1722 08/11/17 1727  NA 143 146*  K 3.5 3.5  CL 110 107  CO2 27  --   GLUCOSE 127* 125*  BUN 27* 28*  CREATININE 1.58* 1.50*  CALCIUM 9.4  --    GFR: Estimated Creatinine Clearance: 46.7 mL/min (A) (by C-G formula based on SCr of 1.5 mg/dL (H)). Liver Function Tests:  Recent Labs Lab 08/11/17 1722  AST 34  ALT 20  ALKPHOS 114  BILITOT 1.0  PROT 7.1  ALBUMIN 3.8   No results for input(s): LIPASE, AMYLASE in the last 168 hours. No results for input(s): AMMONIA in the last 168 hours. Coagulation Profile:  Recent Labs Lab 08/11/17 1722  INR 1.08   Cardiac Enzymes:  Recent Labs Lab 08/11/17 2015 08/12/17 0233 08/12/17 0900  TROPONINI 0.27* 0.33* 0.24*   BNP (last 3 results) No results for input(s): PROBNP in the last 8760 hours. HbA1C:  Recent Labs  08/12/17 0233  HGBA1C 5.7*   CBG: No results for input(s): GLUCAP in the last 168 hours. Lipid Profile:  Recent Labs  08/12/17 0233  CHOL 101  HDL 38*  LDLCALC 45  TRIG 90  CHOLHDL 2.7   Thyroid Function Tests: No results for input(s): TSH, T4TOTAL, FREET4, T3FREE, THYROIDAB in the last 72 hours. Anemia Panel: No results for input(s): VITAMINB12, FOLATE, FERRITIN, TIBC, IRON, RETICCTPCT in the last 72 hours. Urine analysis:    Component Value Date/Time   COLORURINE AMBER (A) 08/11/2017 2308   APPEARANCEUR CLOUDY (A) 08/11/2017 2308   LABSPEC 1.028 08/11/2017 2308   PHURINE 5.0 08/11/2017 2308   GLUCOSEU NEGATIVE 08/11/2017 2308   HGBUR NEGATIVE 08/11/2017 2308   BILIRUBINUR NEGATIVE 08/11/2017 2308   KETONESUR NEGATIVE 08/11/2017 2308   PROTEINUR 30 (A) 08/11/2017 2308   UROBILINOGEN 1.0 06/01/2012 1241   NITRITE NEGATIVE  08/11/2017 2308   LEUKOCYTESUR NEGATIVE 08/11/2017 2308   Sepsis Labs: @LABRCNTIP (procalcitonin:4,lacticidven:4)  )No results found for this or any previous visit (from the past 240 hour(s)).    Radiology Studies: Dg Chest 2 View  Result Date: 08/11/2017 CLINICAL DATA:  CVA.  Slurred speech. EXAM: CHEST  2 VIEW COMPARISON:  Radiographs 06/01/2012 FINDINGS: The cardiomediastinal contours are normal. Borderline cardiomegaly. Pulmonary vasculature is normal. No consolidation, pleural effusion, or pneumothorax. No acute osseous abnormalities are seen. IMPRESSION: No acute abnormality.  Borderline cardiomegaly. Electronically Signed   By: Jeb Levering M.D.   On: 08/11/2017 21:40   Mr Brain Wo Contrast  Result Date: 08/11/2017 CLINICAL DATA:  Stroke follow-up EXAM: MRI HEAD WITHOUT CONTRAST MRA HEAD WITHOUT CONTRAST TECHNIQUE: Multiplanar, multiecho pulse sequences of the brain and surrounding structures were obtained without intravenous  contrast. Angiographic images of the head were obtained using MRA technique without contrast. COMPARISON:  Head CT 08/11/2017 FINDINGS: MRI HEAD FINDINGS Brain: The midline structures are normal. There is diffusion restriction within the right insular cortex and within the right middle cerebellar peduncle. There are punctate foci of diffusion restriction within the superior right frontal and parietal white matter. There are multiple old infarcts of the cerebellum and lacunar infarcts basal ganglia. There is no mass effect or acute hemorrhage. No mass lesions. No chronic microhemorrhage or cerebral amyloid angiopathy. No hydrocephalus, age advanced atrophy or lobar predominant volume loss. No dural abnormality or extra-axial collection. Skull and upper cervical spine: The visualized skull base, calvarium, upper cervical spine and extracranial soft tissues are normal. Sinuses/Orbits: No fluid levels or advanced mucosal thickening. No mastoid effusion. Normal orbits. MRA  HEAD FINDINGS Intracranial internal carotid arteries: Normal. Anterior cerebral arteries: Diminutive A1 segment of the left anterior cerebral artery, normal variant. Normal right ACA. Middle cerebral arteries:  No occlusion or high grade stenosis. Posterior communicating arteries: Present on the right. Posterior cerebral arteries: Fetal predominant origin of the right PCA. Conventional left PCA. There is moderate narrowing of the distal P2 segment on the right. Basilar artery: Normal. Vertebral arteries: Codominant. Normal. Superior cerebellar arteries: Normal. Anterior inferior cerebellar arteries: Normal. Posterior inferior cerebellar arteries: Normal. IMPRESSION: 1. Acute ischemic infarct of the right insular cortex without associated hemorrhage or mass effect. 2. Additional scattered punctate foci of acute ischemia within the right middle cerebellar peduncle the superior right frontal and parietal white matter. 3. Multiple old cerebellar and bilateral basal ganglia infarcts. Chronic ischemic microangiopathy. 4. No emergent large vessel occlusion or high grade stenosis. Electronically Signed   By: Ulyses Jarred M.D.   On: 08/11/2017 22:37   Mr Jodene Nam Head Wo Contrast  Result Date: 08/11/2017 CLINICAL DATA:  Stroke follow-up EXAM: MRI HEAD WITHOUT CONTRAST MRA HEAD WITHOUT CONTRAST TECHNIQUE: Multiplanar, multiecho pulse sequences of the brain and surrounding structures were obtained without intravenous contrast. Angiographic images of the head were obtained using MRA technique without contrast. COMPARISON:  Head CT 08/11/2017 FINDINGS: MRI HEAD FINDINGS Brain: The midline structures are normal. There is diffusion restriction within the right insular cortex and within the right middle cerebellar peduncle. There are punctate foci of diffusion restriction within the superior right frontal and parietal white matter. There are multiple old infarcts of the cerebellum and lacunar infarcts basal ganglia. There is no  mass effect or acute hemorrhage. No mass lesions. No chronic microhemorrhage or cerebral amyloid angiopathy. No hydrocephalus, age advanced atrophy or lobar predominant volume loss. No dural abnormality or extra-axial collection. Skull and upper cervical spine: The visualized skull base, calvarium, upper cervical spine and extracranial soft tissues are normal. Sinuses/Orbits: No fluid levels or advanced mucosal thickening. No mastoid effusion. Normal orbits. MRA HEAD FINDINGS Intracranial internal carotid arteries: Normal. Anterior cerebral arteries: Diminutive A1 segment of the left anterior cerebral artery, normal variant. Normal right ACA. Middle cerebral arteries:  No occlusion or high grade stenosis. Posterior communicating arteries: Present on the right. Posterior cerebral arteries: Fetal predominant origin of the right PCA. Conventional left PCA. There is moderate narrowing of the distal P2 segment on the right. Basilar artery: Normal. Vertebral arteries: Codominant. Normal. Superior cerebellar arteries: Normal. Anterior inferior cerebellar arteries: Normal. Posterior inferior cerebellar arteries: Normal. IMPRESSION: 1. Acute ischemic infarct of the right insular cortex without associated hemorrhage or mass effect. 2. Additional scattered punctate foci of acute ischemia within the right middle cerebellar peduncle  the superior right frontal and parietal white matter. 3. Multiple old cerebellar and bilateral basal ganglia infarcts. Chronic ischemic microangiopathy. 4. No emergent large vessel occlusion or high grade stenosis. Electronically Signed   By: Ulyses Jarred M.D.   On: 08/11/2017 22:37   Ct Head Code Stroke Wo Contrast  Result Date: 08/11/2017 CLINICAL DATA:  Code stroke. Slurred speech. Ataxia. Suspect stroke. History of hyperlipidemia . EXAM: CT HEAD WITHOUT CONTRAST TECHNIQUE: Contiguous axial images were obtained from the base of the skull through the vertex without intravenous contrast.  COMPARISON:  None. FINDINGS: BRAIN: No intraparenchymal hemorrhage, mass effect nor midline shift. The ventricles and sulci are normal for age. Patchy supratentorial white matter hypodensities within normal range for patient's age, though non-specific are most compatible with chronic small vessel ischemic disease. Focal RIGHT insular blurring of gray-white matter junction. Cystic LEFT thalamus lacunar infarct. RIGHT basal ganglia lacunar perivascular space, less likely lacunar infarct. No acute large vascular territory infarcts. No abnormal extra-axial fluid collections. Basal cisterns are patent. VASCULAR: Moderate calcific atherosclerosis of the carotid siphons. No dense MCA. SKULL: No skull fracture. Small LEFT parietal scalp hematoma without subcutaneous gas radiopaque foreign bodies. SINUSES/ORBITS: The mastoid air-cells and included paranasal sinuses are well-aerated.The included ocular globes and orbital contents are non-suspicious. OTHER: None. ASPECTS [facility] Stroke Program Early CT Score) - Ganglionic level infarction (caudate, lentiform nuclei, internal capsule, insula, M1-M3 cortex): 6 - Supraganglionic infarction (M4-M6 cortex): 3 Total score (0-10 with 10 being normal): 9 IMPRESSION: 1. RIGHT insular age-indeterminate infarct. RIGHT basal ganglia perivascular space versus lacunar infarct. 2. ASPECTS is 9 3. Mild chronic small vessel ischemic disease and old LEFT thalamus lacunar infarct. 4. Critical Value/emergent results were called by telephone at the time of interpretation on 08/11/2017 at 5:02 pm to Dr. Leonel Ramsay, Neurology , who verbally acknowledged these results. Electronically Signed   By: Elon Alas M.D.   On: 08/11/2017 17:06     Scheduled Meds: . aspirin  300 mg Rectal Daily   Or  . aspirin  325 mg Oral Daily  . atorvastatin  40 mg Oral Daily   Continuous Infusions: . sodium chloride 1,000 mL (08/12/17 0017)     LOS: 1 day   Time Spent in minutes   30  minutes  Sharlon Pfohl D.O. on 08/12/2017 at 4:01 PM  Between 7am to 7pm - Pager - [phone]  After 7pm go to [email or web address] - password [email or web address]  And look for the night coverage person covering for me after hours  [facility] Office  [phone]

## 2017-08-12 NOTE — Care Management Note (Signed)
Case Management Note  Patient Details  Name: Samuel Walls MRN: [id number] Date of Birth: 11-Oct-1941  Subjective/Objective:    Pt admitted with CVA. He is from home with his spouse.                 Action/Plan: No f/u and no DME per PT/OT. Pt has PCP, insurance and transportation home. No further needs per CM.   Expected Discharge Date:                  Expected Discharge Plan:  Home/Self Care  In-House Referral:     Discharge planning Services     Post Acute Care Choice:    Choice offered to:     DME Arranged:    DME Agency:     HH Arranged:    [place] Agency:     Status of Service:  Completed, signed off  If discussed at [employer] of Stay Meetings, dates discussed:    Additional Comments:  Pollie Friar, RN 08/12/2017, 1:50 PM

## 2017-08-12 NOTE — Progress Notes (Signed)
    [facility] has been requested to perform a transesophageal echocardiogram on Samuel Walls for stroke.  After careful review of history and examination, the risks and benefits of transesophageal echocardiogram have been explained including risks of esophageal damage, perforation (1:10,000 risk), bleeding, pharyngeal hematoma as well as other potential complications associated with conscious sedation including aspiration, arrhythmia, respiratory failure and death. Alternatives to treatment were discussed, questions were answered. Patient is willing to proceed.   Note- Pt is bradycardic, otherwise vital signs are stable.    The procedure is scheduled for 2:00 tomorrow to be done by Dr. Debara Pickett. He will need to be NPO after midnight.   Daune Perch, NP  08/12/2017 5:17 PM

## 2017-08-12 NOTE — Evaluation (Signed)
Speech Language Pathology Evaluation Patient Details Name: Samuel Walls MRN: [id number] DOB: [birth year] Today's Date: 08/12/2017 Time: 5465-0354 SLP Time Calculation (min) (ACUTE ONLY): 10 min  Problem List:  Patient Active Problem List   Diagnosis Date Noted  . CVA (cerebral vascular accident) ([place]) 08/11/2017  . Elevated troponin 08/11/2017  . Stroke (cerebrum) ([place]) 08/11/2017  . Malignant neoplasm of prostate ([place]) 11/20/2016  . Sinus bradycardia 11/24/2015  . Essential hypertension 04/29/2014  . 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 CAD (coronary artery disease)   . Hyperlipidemia LDL goal < 70   . Bradycardia   . Ischemic cardiomyopathy   . Hyperlipidemia 06/11/2012  . Cough 06/11/2012   Past Medical History:  Past Medical History:  Diagnosis Date  . BPH (benign prostatic hyperplasia)   . CAD (coronary artery disease) cardiologist-  dr Joellyn Quails   a.  NSTEMI  05-31-2012  s/p  DES to pLAD  . Chronic sinus bradycardia   . History of acute congestive heart failure    [phone]  diastolic and systolic in setting NSTEMI  . History of non-ST elevation myocardial infarction (NSTEMI)    07/ 2013   s/p  DES to LAD  . Hyperlipidemia LDL goal < 70   . Ischemic cardiomyopathy    07/ 2013  per cardiac cath ef 35%/  last echo 11-29-2013  improved at 50%  . Nocturia   . Prostate cancer [facility]) urologist-  dr chao/  oncologist-  dr Tammi Klippel   T1c, Gleason 7,  PSA 4.9  . S/P drug eluting coronary stent placement    05-31-2012   DES x1  to pLAD  . Wears glasses    Past Surgical History:  Past Surgical History:  Procedure Laterality Date  . ANKLE CLOSED REDUCTION Left 2000  . LEFT HEART CATHETERIZATION WITH CORONARY ANGIOGRAM N/A 05/31/2012   Procedure: LEFT HEART CATHETERIZATION WITH CORONARY ANGIOGRAM;  Surgeon: Sherren Mocha, MD;  Location[facility] CATH LAB;  Service: Cardiovascular;  Laterality: N/A;  PCI w/ DES x1 to pLAD (95%);  moderate stenoses of RCA and CLx;  moderate severe  segmental LV dysfunction, ef 35%  . PROSTATE BIOPSY    . RADIOACTIVE SEED IMPLANT N/A 01/02/2017   Procedure: RADIOACTIVE SEED IMPLANT/BRACHYTHERAPY IMPLANT;  Surgeon: Joie Bimler, MD;  Location: [facility];  Service: Urology;  Laterality: N/A;  . TRANSTHORACIC ECHOCARDIOGRAM  11/29/2013   midl focal basal LVH of the septum,  EF 50%, akinesis of the mid-distalanteroseptal and apical myocardium,  grade 1 diastolic dysfunction/  mild LAE/  mild dilated aortic root/  trivial MR and TR   HPI:  Samuel Walls a 76 y.o.malewith medical history significant of CAD, sinus bradycardia, HLD, ischemic cardiomyopathy chronic systolic CHF, prostate cancer sp seed implantation.  Presented with weakness difficulty with ambulation, He had fall at [place] falling out of his mouth since AM patient had been having left facial and upper and lower extremity weakness.  MRI +acute CVA.   Assessment / Plan / Recommendation Clinical Impression   Pt presents with grossly intact cognitive-linguistic function.  Pt's speech is fluent and free from dysarthria or word finding impairment.  Pt also demonstrates age appropriate recall, problem solving, attention, and awareness.  Per pt's report, he is back to baseline for speech, language, and cognition.  As a result, no further ST needs are indicated at this time.      SLP Assessment  SLP Recommendation/Assessment: Patient does not need any further Speech Lanaguage Pathology Services    Follow Up  Recommendations  None    Frequency and Duration           SLP Evaluation Cognition  Overall Cognitive Status: Within Functional Limits for tasks assessed       Comprehension  Auditory Comprehension Overall Auditory Comprehension: Appears within functional limits for tasks assessed    Expression Expression Primary Mode of Expression: Verbal Verbal Expression Overall Verbal Expression: Appears within functional limits for tasks assessed   Oral /  Motor  Oral Motor/Sensory Function Overall Oral Motor/Sensory Function: Within functional limits Motor Speech Overall Motor Speech: Appears within functional limits for tasks assessed   GO                    Samuel Walls, Selinda Orion 08/12/2017, 10:06 AM

## 2017-08-13 ENCOUNTER — Encounter (HOSPITAL_COMMUNITY): Payer: Self-pay | Admitting: *Deleted

## 2017-08-13 ENCOUNTER — Encounter (HOSPITAL_COMMUNITY)
Admission: EM | Disposition: A | Discharge status: Home / Self Care | Payer: Self-pay | Source: Home / Self Care | Attending: Internal Medicine

## 2017-08-13 ENCOUNTER — Ambulatory Visit (HOSPITAL_COMMUNITY): Admit: 2017-08-13 | Payer: PPO | Admitting: Internal Medicine

## 2017-08-13 ENCOUNTER — Inpatient Hospital Stay (HOSPITAL_COMMUNITY): Payer: PPO

## 2017-08-13 DIAGNOSIS — Q2112 Patent foramen ovale: Secondary | ICD-10-CM

## 2017-08-13 DIAGNOSIS — Q211 Atrial septal defect: Secondary | ICD-10-CM

## 2017-08-13 DIAGNOSIS — I638 Other cerebral infarction: Secondary | ICD-10-CM

## 2017-08-13 HISTORY — PX: TEE WITHOUT CARDIOVERSION: SHX5443

## 2017-08-13 HISTORY — PX: LOOP RECORDER INSERTION: EP1214

## 2017-08-13 LAB — BASIC METABOLIC PANEL
ANION GAP: 9 (ref 5–15)
BUN: 21 mg/dL — ABNORMAL HIGH (ref 6–20)
CALCIUM: 8.8 mg/dL — AB (ref 8.9–10.3)
CHLORIDE: 108 mmol/L (ref 101–111)
CO2: 24 mmol/L (ref 22–32)
CREATININE: 1.14 mg/dL (ref 0.61–1.24)
GFR calc Af Amer: >60 mL/min (ref >60)
GFR calc non Af Amer: >60 mL/min (ref >60)
GLUCOSE: 105 mg/dL — AB (ref 65–99)
Potassium: 3.7 mmol/L (ref 3.5–5.1)
Sodium: 141 mmol/L (ref 135–145)

## 2017-08-13 LAB — CBC
HEMATOCRIT: 35.4 % — AB (ref 39.0–52.0)
HEMOGLOBIN: 11.9 g/dL — AB (ref 13.0–17.0)
MCH: 30.7 pg (ref 26.0–34.0)
MCHC: 33.6 g/dL (ref 30.0–36.0)
MCV: 91.2 fL (ref 78.0–100.0)
Platelets: 171 10*3/uL (ref 150–400)
RBC: 3.88 MIL/uL — ABNORMAL LOW (ref 4.22–5.81)
RDW: 13.8 % (ref 11.5–15.5)
WBC: 8.7 10*3/uL (ref 4.0–10.5)

## 2017-08-13 LAB — VAS US CAROTID
LCCADSYS: -67 cm/s
LCCAPDIAS: -12 cm/s
LEFT ECA DIAS: -15 cm/s
LEFT VERTEBRAL DIAS: -17 cm/s
LICADDIAS: -26 cm/s
Left CCA dist dias: -14 cm/s
Left CCA prox sys: -87 cm/s
Left ICA dist sys: -92 cm/s
Left ICA prox dias: -19 cm/s
Left ICA prox sys: -67 cm/s
RCCAPSYS: 68 cm/s
RIGHT ECA DIAS: -9 cm/s
RIGHT VERTEBRAL DIAS: -8 cm/s
Right CCA prox dias: 14 cm/s
Right cca dist sys: -75 cm/s

## 2017-08-13 SURGERY — ECHOCARDIOGRAM, TRANSESOPHAGEAL
Anesthesia: Moderate Sedation

## 2017-08-13 SURGERY — LOOP RECORDER INSERTION

## 2017-08-13 MED ORDER — BUTAMBEN-TETRACAINE-BENZOCAINE 2-2-14 % EX AERO
INHALATION_SPRAY | CUTANEOUS | Status: DC | PRN
  Administered 2017-08-13: 2 via TOPICAL

## 2017-08-13 MED ORDER — LIDOCAINE HCL (PF) 1 % IJ SOLN
INTRAMUSCULAR | Status: DC | PRN
  Administered 2017-08-13: 15 mL via SUBCUTANEOUS

## 2017-08-13 MED ORDER — ATORVASTATIN CALCIUM 40 MG PO TABS: 40.0000 mg | ORAL_TABLET | Freq: Every day | ORAL | 3 refills | Status: DC

## 2017-08-13 MED ORDER — ASPIRIN 325 MG PO TABS: 325.0000 mg | ORAL_TABLET | Freq: Every day | ORAL | 3 refills | Status: DC

## 2017-08-13 MED ORDER — LIDOCAINE VISCOUS 2 % MT SOLN
OROMUCOSAL | Status: DC | PRN
Start: 2017-08-13 — End: 2017-08-13
  Administered 2017-08-13: 1 via OROMUCOSAL

## 2017-08-13 MED ORDER — ACETAMINOPHEN 325 MG PO TABS: 325.0000 mg | ORAL_TABLET | ORAL | Status: DC | PRN

## 2017-08-13 MED ORDER — LIDOCAINE-EPINEPHRINE 1 %-1:100000 IJ SOLN
INTRAMUSCULAR | Status: AC
Start: 2017-08-13 — End: ?
  Filled 2017-08-13: qty 1

## 2017-08-13 MED ORDER — FENTANYL CITRATE (PF) 100 MCG/2ML IJ SOLN
INTRAMUSCULAR | Status: AC
  Filled 2017-08-13: qty 2

## 2017-08-13 MED ORDER — ONDANSETRON HCL 4 MG/2ML IJ SOLN: 4.0000 mg | Freq: Four times a day (QID) | INTRAMUSCULAR | Status: DC | PRN

## 2017-08-13 MED ORDER — FENTANYL CITRATE (PF) 100 MCG/2ML IJ SOLN
INTRAMUSCULAR | Status: DC | PRN
  Administered 2017-08-13 (×2): 25 ug via INTRAVENOUS

## 2017-08-13 MED ORDER — AMLODIPINE BESYLATE 2.5 MG PO TABS
2.5000 mg | ORAL_TABLET | Freq: Every day | ORAL | 3 refills | Status: DC
Start: 2017-08-13 — End: 2018-04-27

## 2017-08-13 MED ORDER — LIDOCAINE VISCOUS 2 % MT SOLN
OROMUCOSAL | Status: AC
  Filled 2017-08-13: qty 15

## 2017-08-13 MED ORDER — MIDAZOLAM HCL 10 MG/2ML IJ SOLN
INTRAMUSCULAR | Status: DC | PRN
  Administered 2017-08-13: 1 mg via INTRAVENOUS
  Administered 2017-08-13: 2 mg via INTRAVENOUS
  Administered 2017-08-13: 1 mg via INTRAVENOUS
  Administered 2017-08-13: .5 mg via INTRAVENOUS

## 2017-08-13 MED ORDER — MIDAZOLAM HCL 5 MG/ML IJ SOLN
INTRAMUSCULAR | Status: AC
  Filled 2017-08-13: qty 2

## 2017-08-13 MED ORDER — LOSARTAN POTASSIUM 100 MG PO TABS: 100.0000 mg | ORAL_TABLET | Freq: Every day | ORAL | 3 refills | Status: DC

## 2017-08-13 SURGICAL SUPPLY — 2 items
LOOP REVEAL LINQSYS (Prosthesis & Implant Heart) ×2 IMPLANT
PACK LOOP INSERTION (CUSTOM PROCEDURE TRAY) ×2 IMPLANT

## 2017-08-13 NOTE — Progress Notes (Deleted)
  Echocardiogram 2D Echocardiogram has been performed.  Merrie Roof F 08/13/2017, 11:47 AM

## 2017-08-13 NOTE — Progress Notes (Signed)
  Echocardiogram Echocardiogram Transesophageal has been performed.  Merrie Roof F 08/13/2017, 11:49 AM

## 2017-08-13 NOTE — Progress Notes (Addendum)
STROKE TEAM PROGRESS NOTE   SUBJECTIVE (INTERVAL HISTORY) TEE performed today showed small PFO. Dr. Debara Pickett recommended PFO closure, will let Dr. Burt Knack know. Loop recorder being placed. No acute event overnight.   OBJECTIVE Temp:  [97.7 F (36.5 C)-98.5 F (36.9 C)] 97.7 F (36.5 C) (09/26 1420) Pulse Rate:  [36-51] 39 (09/26 1420) Cardiac Rhythm: Sinus bradycardia (09/26 0700) Resp:  [10-20] 17 (09/26 1420) BP: (125-189)/(69-96) 148/70 (09/26 1420) SpO2:  [94 %-100 %] 99 % (09/26 1420) Weight:  [200 lb (90.7 kg)] 200 lb (90.7 kg) (09/26 1003)  CBC:   Recent Labs Lab 08/11/17 1722 08/11/17 1727 08/13/17 0511  WBC 8.2  --  8.7  NEUTROABS 5.4  --   --   HGB 12.4* 13.6 11.9*  HCT 38.3* 40.0 35.4*  MCV 91.6  --  91.2  PLT 202  --  144    Basic Metabolic Panel:   Recent Labs Lab 08/11/17 1722 08/11/17 1727 08/13/17 0511  NA 143 146* 141  K 3.5 3.5 3.7  CL 110 107 108  CO2 27  --  24  GLUCOSE 127* 125* 105*  BUN 27* 28* 21*  CREATININE 1.58* 1.50* 1.14  CALCIUM 9.4  --  8.8*    Lipid Panel:     Component Value Date/Time   CHOL 101 08/12/2017 0233   TRIG 90 08/12/2017 0233   HDL 38 (L) 08/12/2017 0233   CHOLHDL 2.7 08/12/2017 0233   VLDL 18 08/12/2017 0233   LDLCALC 45 08/12/2017 0233   HgbA1c:  Lab Results  Component Value Date   HGBA1C 5.7 (H) 08/12/2017   Urine Drug Screen:     Component Value Date/Time   LABOPIA NONE DETECTED 08/11/2017 2308   COCAINSCRNUR NONE DETECTED 08/11/2017 2308   LABBENZ NONE DETECTED 08/11/2017 2308   AMPHETMU NONE DETECTED 08/11/2017 2308   THCU NONE DETECTED 08/11/2017 2308   LABBARB NONE DETECTED 08/11/2017 2308    Alcohol Level     Component Value Date/Time   ETH <5 08/12/2017 0233    IMAGING I have personally reviewed the radiological images below and agree with the radiology interpretations.  Dg Chest 2 View 08/11/2017 IMPRESSION: No acute abnormality.  Borderline cardiomegaly.   Samuel Walls 63  Contrast Samuel Samuel Walls Head Wo Contrast 08/11/2017 IMPRESSION: 1. Acute ischemic infarct of the right insular cortex without associated hemorrhage or mass effect. 2. Additional scattered punctate foci of acute ischemia within the right middle cerebellar peduncle the superior right frontal and parietal white matter. 3. Multiple old cerebellar and bilateral basal ganglia infarcts. Chronic ischemic microangiopathy. 4. No emergent large vessel occlusion or high grade stenosis.   Ct Head Code Stroke Wo Contrast 08/11/2017 IMPRESSION: 1. RIGHT insular age-indeterminate infarct. RIGHT basal ganglia perivascular space versus lacunar infarct. 2. ASPECTS is 9 3. Mild chronic small vessel ischemic disease and old LEFT thalamus lacunar infarct.   Lower Extremity US (DVT) 08/12/2017 Summary: - No evidence of deep vein thrombosis involving the visualized veins of the right lower extremity. - No evidence of deep vein thrombosis involving the visualized veins of the left lower extremity.  Carotid US 08/12/2017 Preliminary findings: Bilateral 1-39% ICA stenosis, antegrade vertebral flow.  TTE 08/12/2017 Study Conclusions - Left ventricle: The cavity size was normal. Wall thickness was increased in a pattern of mild LVH. Systolic function was mildly reduced. The estimated ejection fraction was in the range of 45% to 50%. There is akinesis of the apical myocardium. Doppler parameters are consistent with abnormal left ventricular relaxation (  grade 1 diastolic dysfunction). - Mitral valve: There was mild regurgitation. - Left atrium: The atrium was moderately dilated. - Right ventricle: The cavity size was mildly dilated. - Atrial septum: There was an atrial septal aneurysm. Impressions: - Apical akinesis with overall mildly reduced LV systolic function; mild LVH; mild diastolic dysfunction; mild Samuel; moderate LAE; mild RVE; atrial septal aneurysm.  TEE 1. Small PFO is present with bidirectional flow. 2. No LAA  thrombus is noted 3. LVEF 45-50% with anteroapical hypokinesis 4. Witnessed apnea and loud snoring with airway obstruction   PHYSICAL EXAM  Temp:  [97.7 F (36.5 C)-98.5 F (36.9 C)] 97.7 F (36.5 C) (09/26 1420) Pulse Rate:  [36-51] 39 (09/26 1420) Resp:  [10-20] 17 (09/26 1420) BP: (125-189)/(69-96) 148/70 (09/26 1420) SpO2:  [94 %-100 %] 99 % (09/26 1420) Weight:  [200 lb (90.7 kg)] 200 lb (90.7 kg) (09/26 1003)  General - Well nourished, well developed, in no apparent distress.  Ophthalmologic - Sharp disc margins OU.   Cardiovascular - Regular rate and rhythm.  Mental Status -  Level of arousal and orientation to time, place, and person were intact. Language including expression, naming, repetition, comprehension was assessed and found intact. Attention span and concentration were normal. Fund of Knowledge was assessed and was intact.  Cranial Nerves II - XII - II - Visual field intact OU. III, IV, VI - Extraocular movements intact. V - Facial sensation intact bilaterally. VII - Facial movement intact bilaterally. VIII - Hearing & vestibular intact bilaterally. X - Palate elevates symmetrically. XI - Chin turning & shoulder shrug intact bilaterally. XII - Tongue protrusion intact.  Motor Strength - The patient's strength was normal in all extremities and pronator drift was absent.  Bulk was normal and fasciculations were absent.   Motor Tone - Muscle tone was assessed at the neck and appendages and was normal.  Reflexes - The patient's reflexes were 1+ in all extremities and he had no pathological reflexes.  Sensory - Light touch, temperature/pinprick, vibration and proprioception, and Romberg testing were assessed and were symmetrical.    Coordination - The patient had normal movements in the hands and feet with no ataxia or dysmetria.  Tremor was absent.  Gait and Station - The patient's transfers, posture, gait, station, and turns were observed as  normal.   ASSESSMENT/PLAN Samuel. Samuel Walls is a 76 y.o. male with history of CAD with prior NSTEMI and drug-eluting stent, chronic sinus bradycardia, and hyperlipidemia who presents with facial droop, slurred speech, and difficulty climbing stairs due to left-sided weakness. He did not receive IV t-PA due to arriving outside of the tPA treatment windw.   Stroke:  Acute ischemic R insular cortex and scattered punctate R middle cerebellar peduncle, as well as subacute R superior frontal, and parietal white matter infarcts, embolic pattern, etiology unclear, concerning for cardioembolic events  Resultant  no neuro deficit  CT head: RIGHT insular age-indeterminate infarct. RIGHT basal ganglia perivascular space versus lacunar infarct.  MRI head: Acute ischemic R insular cortex and scattered punctate R middle cerebellar peduncle, and subacute R superior frontal, and parietal white matter infarcts  MRA head: No LVO or high-grade stenosis  Carotid Doppler: B ICA 1-39% stenosis, VAs antegrade  2D Echo: Apical akinesis, EF 45-50%  LE venous Doppler negative for DVT  TEE small PFO  Loop recorder placed  LDL 45  HgbA1c 5.7  UDS negative  SCDs for VTE prophylaxis Diet Heart Room service appropriate? Yes; Fluid consistency: Thin Diet - low  sodium heart healthy  No antithrombotic prior to admission, now on aspirin 325 mg daily. Continue ASA 325mg  on discharge.  Patient counseled to be compliant with his antithrombotic medications  Ongoing aggressive stroke risk factor management  Therapy recommendations: None  Disposition: pending  CAD  S/p stent  History of NSTEMI  History of cardiomyopathy, EF improved from 35% to 50%  2-D echo showed apical akinesis with EF 45-50%  TEE EF 45-50%, anteroapical hypokinesis  Follow up with cardiology Dr. Marlou Porch as outpt  PFO  TEE showed small PFO  Dr. Debara Pickett recommend PFO closure, will let Dr. Burt Knack know  However, pt age is 26,  multiple stroke risk factors including CAD s/p stent, HTN, HLD, OSA, previous cardiomyopathy, he is not a good candidate for PFO closure.  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 Please call with questions. Pt will follow up with Cecille Rubin, NP, at [facility] in about 6 weeks. Thanks for the consult.  Rosalin Hawking, MD PhD Stroke Neurology 08/13/2017 5:24 PM   To contact Stroke Continuity provider, please refer to [email or web address] After hours, contact General Neurology

## 2017-08-13 NOTE — H&P (Signed)
   INTERVAL PROCEDURE H&P  History and Physical Interval Note:  08/13/2017 10:39 AM  Samuel Walls has presented today for their planned procedure. The various methods of treatment have been discussed with the patient and family. After consideration of risks, benefits and other options for treatment, the patient has consented to the procedure.  The patients' outpatient history has been reviewed, patient examined, and no change in status from most recent office note within the past 30 days. I have reviewed the patients' chart and labs and will proceed as planned. Questions were answered to the patient's satisfaction.   Samuel Casino, MD, [place]  Attending Cardiologist  Direct Dial: [phone]  Fax: [phone]  Website:  [email or web address] Moss 08/13/2017, 10:39 AM

## 2017-08-13 NOTE — Consult Note (Signed)
ELECTROPHYSIOLOGY CONSULT NOTE  Patient ID: Samuel Walls MRN: [id number], DOB/AGE: [birth year]   Admit date: 08/11/2017 Date of Consult: 08/13/2017  Primary Physician: Nicoletta Dress, MD Primary Cardiologist: Dr. Marlou Porch Reason for Consultation: Cryptogenic stroke - ; recommendations regarding Implantable Loop Recorder requested by Dr. Erlinda Hong  History of Present Illness Samuel Walls was admitted on 08/11/2017 with L facial droop, weakness, and slurred speech.  PMHx noted for CAD, not on BB 2/2 asymptomatic baseline bradycardia, hx of CIM with improved LVEF, HLD, HTN.  No known hx of arrhythmias of any kind. He first developed symptoms while at home.  Imaging demonstrated Acute ischemic R insular cortex and scattered punctate R middle cerebellar peduncle, as well as subacute R superior frontal, and parietal white matter infarcts, embolic pattern, etiology unclear, concerning for cardioembolic events.  he has undergone workup for stroke including echocardiogram and carotid dopplers.  The patient has been monitored on telemetry which has demonstrated sinus rhythm with no arrhythmias.  Inpatient stroke work-up is to be completed with a TEE.   Echocardiogram/testing this admission demonstrated  Lower Extremity US (DVT) 08/12/2017 Summary: - No evidence of deep vein thrombosis involving the visualizedveins of the right lower extremity. - No evidence of deep vein thrombosis involving the visualizedveins of the left lower extremity.  Carotid US 08/12/2017 Preliminary findings: Bilateral 1-39% ICA stenosis, antegrade vertebral flow.  TTE 08/12/2017 Study Conclusions - Left ventricle: The cavity size was normal. Wall thickness wasincreased in a pattern of mild LVH. Systolic function was mildlyreduced. The estimated ejection fraction was in the range of 45%to 50%. There is akinesis of the apical myocardium. Dopplerparameters are consistent with abnormal left ventricular relaxation (grade 1  diastolic dysfunction). - Mitral valve: There was mild regurgitation. - Left atrium: The atrium was moderately dilated. - Right ventricle: The cavity size was mildly dilated. - Atrial septum: There was an atrial septal aneurysm. Impressions: - Apical akinesis with overall mildly reduced LV systolic function;mild LVH; mild diastolic dysfunction; mild MR; moderate LAE; mild RVE; atrial septal aneurysm.  Lab work is reviewed.  Prior to admission, the patient denies chest pain, shortness of breath, dizziness, palpitations, or syncope.  They are recovering from their stroke with plans to home at discharge.  EP has been asked to evaluate for placement of an implantable loop recorder to monitor for atrial fibrillation.     Past Medical History:  Diagnosis Date  . BPH (benign prostatic hyperplasia)   . CAD (coronary artery disease) cardiologist-  dr Joellyn Quails   a.  NSTEMI  05-31-2012  s/p  DES to pLAD  . Chronic sinus bradycardia   . History of acute congestive heart failure    [id number]  diastolic and systolic in setting NSTEMI  . History of non-ST elevation myocardial infarction (NSTEMI)    07/ 2013   s/p  DES to LAD  . Hyperlipidemia LDL goal < 70   . Ischemic cardiomyopathy    07/ 2013  per cardiac cath ef 35%/  last echo 11-29-2013  improved at 50%  . Nocturia   . 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 S/P drug eluting coronary stent placement    05-31-2012   DES x1  to pLAD  . Wears glasses      Surgical History:  Past Surgical History:  Procedure Laterality Date  . ANKLE CLOSED REDUCTION Left 2000  . LEFT HEART CATHETERIZATION WITH CORONARY ANGIOGRAM N/A  05/31/2012   Procedure: LEFT HEART CATHETERIZATION WITH CORONARY ANGIOGRAM;  Surgeon: Sherren Mocha, MD;  Location: [facility] CATH LAB;  Service: Cardiovascular;  Laterality: N/A;  PCI w/ DES x1 to pLAD (95%);  moderate stenoses of RCA and CLx;  moderate severe  segmental LV dysfunction, ef 35%  . PROSTATE BIOPSY    . RADIOACTIVE SEED IMPLANT N/A 01/02/2017   Procedure: RADIOACTIVE SEED IMPLANT/BRACHYTHERAPY IMPLANT;  Surgeon: Joie Bimler, MD;  Location: [facility];  Service: Urology;  Laterality: N/A;  . TRANSTHORACIC ECHOCARDIOGRAM  11/29/2013   midl focal basal LVH of the septum,  EF 50%, akinesis of the mid-distalanteroseptal and apical myocardium,  grade 1 diastolic dysfunction/  mild LAE/  mild dilated aortic root/  trivial MR and TR     Prescriptions Prior to Admission  Medication Sig Dispense Refill Last Dose  . amLODipine (NORVASC) 2.5 MG tablet Take 2.5 mg by mouth daily.   08/11/2017 at Unknown time  . atorvastatin (LIPITOR) 40 MG tablet Take 40 mg by mouth daily.   08/11/2017 at Unknown time  . losartan (COZAAR) 100 MG tablet TAKE 1 TABLET BY MOUTH ONCE DAILY 90 tablet 3 08/11/2017 at Unknown time  . Multiple Vitamin (MULTIVITAMIN WITH MINERALS) TABS Take 1 tablet by mouth every morning.    08/11/2017 at Unknown time  . nitroGLYCERIN (NITROSTAT) 0.4 MG SL tablet DISSOLVE ONE TABLET UNDER THE TONGUE EVERY 5 MINUTES AS NEEDED FOR CHEST PAIN.  DO NOT EXCEED A TOTAL OF 3 DOSES IN 15 MINUTES 25 tablet 6 prn  . omega-3 acid ethyl esters (LOVAZA) 1 G capsule Take 2 g by mouth every evening.    08/10/2017 at Unknown time    Inpatient Medications:  . [MAR Hold] aspirin  300 mg Rectal Daily   Or  . [MAR Hold] aspirin  325 mg Oral Daily  . [MAR Hold] atorvastatin  40 mg Oral Daily    Allergies:  Allergies  Allergen Reactions  . Hydrocodone Other (See Comments)    vicodin --"makes me crazy"    Social History   Social History  . Marital status: Married    Spouse name: N/A  . Number of children: N/A  . Years of education: N/A   Occupational History  . Not on file.   Social History Main Topics  . Smoking status: Never Smoker  . Smokeless tobacco: Never Used  . Alcohol use No  . Drug use: No  . Sexual activity: Not on  file   Other Topics Concern  . Not on file   Social History Narrative  . No narrative on file     Family History  Problem Relation Age of Onset  . ALS Mother   . Other Father   . Cancer Neg Hx       Review of Systems: All other systems reviewed and are otherwise negative except as noted above.  Physical Exam: Vitals:   08/13/17 0029 08/13/17 0509 08/13/17 0853 08/13/17 1003  BP: (!) 155/73 (!) 153/70 (!) 125/96   Pulse: (!) 49 (!) 51 (!) 41   Resp: 20 18 18    Temp: 98.1 F (36.7 C) 98.3 F (36.8 C) 98.1 F (36.7 C)   TempSrc: Oral Oral Oral   SpO2: 97% 98% 97%   Weight:    200 lb (90.7 kg)  Height:    6' (1.829 m)    GEN- The patient is well appearing, alert and oriented x 3 today.   Head- normocephalic, atraumatic Eyes-  Sclera clear,  conjunctiva pink Ears- hearing intact Oropharynx- clear Neck- supple Lungs- CTA b/l, normal work of breathing Heart- RRR, bradycardic, no murmurs, rubs or gallops  GI- soft, NT, ND Extremities- no clubbing, cyanosis, or edema MS- no significant deformity or atrophy Skin- no rash or lesion Psych- euthymic mood, full affect   Labs:   Lab Results  Component Value Date   WBC 8.7 08/13/2017   HGB 11.9 (L) 08/13/2017   HCT 35.4 (L) 08/13/2017   MCV 91.2 08/13/2017   PLT 171 08/13/2017    Recent Labs Lab 08/11/17 1722  08/13/17 0511  NA 143  < > 141  K 3.5  < > 3.7  CL 110  < > 108  CO2 27  --  24  BUN 27*  < > 21*  CREATININE 1.58*  < > 1.14  CALCIUM 9.4  --  8.8*  PROT 7.1  --   --   BILITOT 1.0  --   --   ALKPHOS 114  --   --   ALT 20  --   --   AST 34  --   --   GLUCOSE 127*  < > 105*  < > = values in this interval not displayed. Lab Results  Component Value Date   CKTOTAL 1,351 (H) 06/01/2012   CKMB 67.9 (HH) 06/01/2012   TROPONINI 0.24 (HH) 08/12/2017   Lab Results  Component Value Date   CHOL 101 08/12/2017   CHOL 175 05/31/2012   Lab Results  Component Value Date   HDL 38 (L) 08/12/2017   HDL  45 05/31/2012   Lab Results  Component Value Date   LDLCALC 45 08/12/2017   LDLCALC 118 (H) 05/31/2012   Lab Results  Component Value Date   TRIG 90 08/12/2017   TRIG 61 05/31/2012   Lab Results  Component Value Date   CHOLHDL 2.7 08/12/2017   CHOLHDL 3.9 05/31/2012   No results found for: LDLDIRECT  No results found for: DDIMER   Radiology/Studies:  Dg Chest 2 View Result Date: 08/11/2017 CLINICAL DATA:  CVA.  Slurred speech. EXAM: CHEST  2 VIEW COMPARISON:  Radiographs 06/01/2012 FINDINGS: The cardiomediastinal contours are normal. Borderline cardiomegaly. Pulmonary vasculature is normal. No consolidation, pleural effusion, or pneumothorax. No acute osseous abnormalities are seen. IMPRESSION: No acute abnormality.  Borderline cardiomegaly. Electronically Signed   By: Jeb Levering M.D.   On: 08/11/2017 21:40   Mr Brain Wo Contrast Result Date: 08/11/2017 CLINICAL DATA:  Stroke follow-up EXAM: MRI HEAD WITHOUT CONTRAST MRA HEAD WITHOUT CONTRAST TECHNIQUE: Multiplanar, multiecho pulse sequences of the brain and surrounding structures were obtained without intravenous contrast. Angiographic images of the head were obtained using MRA technique without contrast. COMPARISON:  Head CT 08/11/2017 FINDINGS: MRI HEAD FINDINGS Brain: The midline structures are normal. There is diffusion restriction within the right insular cortex and within the right middle cerebellar peduncle. There are punctate foci of diffusion restriction within the superior right frontal and parietal white matter. There are multiple old infarcts of the cerebellum and lacunar infarcts basal ganglia. There is no mass effect or acute hemorrhage. No mass lesions. No chronic microhemorrhage or cerebral amyloid angiopathy. No hydrocephalus, age advanced atrophy or lobar predominant volume loss. No dural abnormality or extra-axial collection. Skull and upper cervical spine: The visualized skull base, calvarium, upper cervical spine  and extracranial soft tissues are normal. Sinuses/Orbits: No fluid levels or advanced mucosal thickening. No mastoid effusion. Normal orbits. MRA HEAD FINDINGS Intracranial internal carotid arteries: Normal. Anterior  cerebral arteries: Diminutive A1 segment of the left anterior cerebral artery, normal variant. Normal right ACA. Middle cerebral arteries:  No occlusion or high grade stenosis. Posterior communicating arteries: Present on the right. Posterior cerebral arteries: Fetal predominant origin of the right PCA. Conventional left PCA. There is moderate narrowing of the distal P2 segment on the right. Basilar artery: Normal. Vertebral arteries: Codominant. Normal. Superior cerebellar arteries: Normal. Anterior inferior cerebellar arteries: Normal. Posterior inferior cerebellar arteries: Normal. IMPRESSION: 1. Acute ischemic infarct of the right insular cortex without associated hemorrhage or mass effect. 2. Additional scattered punctate foci of acute ischemia within the right middle cerebellar peduncle the superior right frontal and parietal white matter. 3. Multiple old cerebellar and bilateral basal ganglia infarcts. Chronic ischemic microangiopathy. 4. No emergent large vessel occlusion or high grade stenosis. Electronically Signed   By: Ulyses Jarred M.D.   On: 08/11/2017 22:37    Ct Head Code Stroke Wo Contrast Result Date: 08/11/2017 CLINICAL DATA:  Code stroke. Slurred speech. Ataxia. Suspect stroke. History of hyperlipidemia . EXAM: CT HEAD WITHOUT CONTRAST TECHNIQUE: Contiguous axial images were obtained from the base of the skull through the vertex without intravenous contrast. COMPARISON:  None. FINDINGS: BRAIN: No intraparenchymal hemorrhage, mass effect nor midline shift. The ventricles and sulci are normal for age. Patchy supratentorial white matter hypodensities within normal range for patient's age, though non-specific are most compatible with chronic small vessel ischemic disease. Focal RIGHT  insular blurring of gray-white matter junction. Cystic LEFT thalamus lacunar infarct. RIGHT basal ganglia lacunar perivascular space, less likely lacunar infarct. No acute large vascular territory infarcts. No abnormal extra-axial fluid collections. Basal cisterns are patent. VASCULAR: Moderate calcific atherosclerosis of the carotid siphons. No dense MCA. SKULL: No skull fracture. Small LEFT parietal scalp hematoma without subcutaneous gas radiopaque foreign bodies. SINUSES/ORBITS: The mastoid air-cells and included paranasal sinuses are well-aerated.The included ocular globes and orbital contents are non-suspicious. OTHER: None. ASPECTS [facility] Stroke Program Early CT Score) - Ganglionic level infarction (caudate, lentiform nuclei, internal capsule, insula, M1-M3 cortex): 6 - Supraganglionic infarction (M4-M6 cortex): 3 Total score (0-10 with 10 being normal): 9 IMPRESSION: 1. RIGHT insular age-indeterminate infarct. RIGHT basal ganglia perivascular space versus lacunar infarct. 2. ASPECTS is 9 3. Mild chronic small vessel ischemic disease and old LEFT thalamus lacunar infarct. 4. Critical Value/emergent results were called by telephone at the time of interpretation on 08/11/2017 at 5:02 pm to Dr. Leonel Ramsay, Neurology , who verbally acknowledged these results. Electronically Signed   By: Elon Alas M.D.   On: 08/11/2017 17:06    12-lead ECG SR/SB All prior EKG's in EPIC reviewed with no documented atrial fibrillation  Telemetry SB/SR  Assessment and Plan:  1. Cryptogenic stroke The patient presents with cryptogenic stroke.  The patient has a TEE planned for this AM.  Dr. Lovena Le saw/examined patient, spoke at length with the patient about monitoring for afib with either a 30 day event monitor or an implantable loop recorder.  Risks, benefits, and alteratives to implantable loop recorder were discussed with the patient today.   At this time, the patient is very clear in their decision to proceed  with implantable loop recorder.   Wound care was reviewed with the patient (keep incision clean and dry for 3 days).  Wound check will be scheduled for the patient  Please call with questions.   Renee Dyane Dustman, PA-C 08/13/2017   EP Attending  Patient seen and examined. Agree with above. The patient has had a cryptogenic stroke  and has a PFO but negative duplex dopplers. I have discussed the indications, risks/benfefits/goals/expectations of insertion of an ILR and he wishes to proceed.   [name].D.

## 2017-08-13 NOTE — Progress Notes (Signed)
Patient has been NPO since midnight, consent is done and in chart, appears the TEE is scheduled for 1400 today.

## 2017-08-13 NOTE — Discharge Summary (Addendum)
Physician Discharge Summary   Patient ID: Samuel Walls MRN: [id number] DOB/AGE: 76-Mar-1942 76 y.o.  Admit date: 08/11/2017 Discharge date: 08/13/2017  Primary Care Physician:  Nicoletta Dress, MD  Discharge Diagnoses:   . Acute CVA (cerebral vascular accident) ([place]) . Elevated troponin . Sinus bradycardia . Malignant neoplasm of prostate ([place]) . Hyperlipidemia . Essential hypertension . CAD (coronary artery disease) . Bradycardia   Small PFO     Consults: Neurology Cardiology  Recommendations for Outpatient Follow-up:  1. Recommended follow-up with Dr. Burt Knack regarding the small PFO closure  2. Please repeat CBC/BMET at next visit  DIET: heart healthy diet    Allergies:   Allergies  Allergen Reactions  . Hydrocodone Other (See Comments)    vicodin --"makes me crazy"     DISCHARGE MEDICATIONS: Current Discharge Medication List    START taking these medications   Details  aspirin 325 MG tablet Take 1 tablet (325 mg total) by mouth daily. Qty: 30 tablet, Refills: 3      CONTINUE these medications which have CHANGED   Details  amLODipine (NORVASC) 2.5 MG tablet Take 1 tablet (2.5 mg total) by mouth daily. Qty: 30 tablet, Refills: 3    atorvastatin (LIPITOR) 40 MG tablet Take 1 tablet (40 mg total) by mouth daily. Qty: 30 tablet, Refills: 3    losartan (COZAAR) 100 MG tablet Take 1 tablet (100 mg total) by mouth daily. Qty: 30 tablet, Refills: 3      CONTINUE these medications which have NOT CHANGED   Details  Multiple Vitamin (MULTIVITAMIN WITH MINERALS) TABS Take 1 tablet by mouth every morning.     nitroGLYCERIN (NITROSTAT) 0.4 MG SL tablet DISSOLVE ONE TABLET UNDER THE TONGUE EVERY 5 MINUTES AS NEEDED FOR CHEST PAIN.  DO NOT EXCEED A TOTAL OF 3 DOSES IN 15 MINUTES Qty: 25 tablet, Refills: 6    omega-3 acid ethyl esters (LOVAZA) 1 G capsule Take 2 g by mouth every evening.          Brief H and P: For complete details please refer to  admission H and P, but in brief HPI on 08/11/2017 by Dr. Susann Givens [name] a 76 y.o.malewith medical history significant of CAD, sinus bradycardia, HLD, ischemic cardiomyopathy chronic systolic CHF, prostate cancer sp seed implantation Presented with weakness difficulty with ambulation, He had fall at [place] falling out of his mouth since AM patient had been having left facial and upper and lower extremity weakness denies any chest pain or shortness of breath no nausea vomiting have had some slurred speech but hours no mental status changes. He initially did not want to come to the ER to be evaluated.  in ER code stroke called Dr. Leonel Ramsay with neurology , evaluated the patient, patient was out of the window for [facility] Course:   Acute CVA presented with weakness and difficulty with ambulation, left facial droop, upper and lower extremity weakness - CT head showed right insular age indeterminate infarct, right basal ganglia perivascular space versus lacunar infarct, old left thalamus lacunar infarct - MRI showed acute ischemic infarct of the right insular cortex without associated hemorrhage or mass effect. Additional scattered punctate foci of acute ischemia in the right middle cerebellar peduncle superior right frontal and parietal white matter. Multiple old cerebellar and bilateral basal ganglia infarcts. No emergent large vessel occlusion or high-grade stenosis. - Carotid Dopplers showed bilateral 1-39% ICA stenosis, antegrade vertebral flow - LDL 45, hemoglobin A1c 5.7 - 2-D echo showed  EF of 86-57%, grade 1 diastolic dysfunction - neurology recommended TEE and loop recorder - TEE showed small PFO present with bidirectional flow, no LAA thrombus, EF 45-50% within antero apical hypokinesis. Also recommended outpatient sleep study - Loop recorder placed prior to discharge - PT evaluation recommended no PT follow-up  History of prostate CA - Status post  radioactive seed implantation  Essential hypertension - currently stable, continue losartan, Norvasc  Hyperlipidemia - Continue statin  Coronary disease, mildly elevated troponin - Currently no chest pain, continue statin, aspirin - Elevated troponins likely due to acute CVA  Bradycardia - Stable, not on BB   Day of Discharge BP (!) 148/70 (BP Location: Left Arm)   Pulse (!) 39   Temp 97.7 F (36.5 C) (Oral)   Resp 17   Ht 6' (1.829 m)   Wt 90.7 kg (200 lb)   SpO2 99%   BMI 27.12 kg/m   Physical Exam: General: Alert and awake oriented x3 not in any acute distress. HEENT: anicteric sclera, pupils reactive to light and accommodation CVS: S1-S2 clear no murmur rubs or gallops Chest: clear to auscultation bilaterally, no wheezing rales or rhonchi Abdomen: soft nontender, nondistended, normal bowel sounds Extremities: no cyanosis, clubbing or edema noted bilaterally Neuro: Cranial nerves II-XII intact, no focal neurological deficits   The results of significant diagnostics from this hospitalization (including imaging, microbiology, ancillary and laboratory) are listed below for reference.    LAB RESULTS: Basic Metabolic Panel:  Recent Labs Lab 08/11/17 1722 08/11/17 1727 08/13/17 0511  NA 143 146* 141  K 3.5 3.5 3.7  CL 110 107 108  CO2 27  --  24  GLUCOSE 127* 125* 105*  BUN 27* 28* 21*  CREATININE 1.58* 1.50* 1.14  CALCIUM 9.4  --  8.8*   Liver Function Tests:  Recent Labs Lab 08/11/17 1722  AST 34  ALT 20  ALKPHOS 114  BILITOT 1.0  PROT 7.1  ALBUMIN 3.8   No results for input(s): LIPASE, AMYLASE in the last 168 hours. No results for input(s): AMMONIA in the last 168 hours. CBC:  Recent Labs Lab 08/11/17 1722 08/11/17 1727 08/13/17 0511  WBC 8.2  --  8.7  NEUTROABS 5.4  --   --   HGB 12.4* 13.6 11.9*  HCT 38.3* 40.0 35.4*  MCV 91.6  --  91.2  PLT 202  --  171   Cardiac Enzymes:  Recent Labs Lab 08/12/17 0233 08/12/17 0900   TROPONINI 0.33* 0.24*   BNP: Invalid input(s): POCBNP CBG: No results for input(s): GLUCAP in the last 168 hours.  Significant Diagnostic Studies:  Dg Chest 2 View  Result Date: 08/11/2017 CLINICAL DATA:  CVA.  Slurred speech. EXAM: CHEST  2 VIEW COMPARISON:  Radiographs 06/01/2012 FINDINGS: The cardiomediastinal contours are normal. Borderline cardiomegaly. Pulmonary vasculature is normal. No consolidation, pleural effusion, or pneumothorax. No acute osseous abnormalities are seen. IMPRESSION: No acute abnormality.  Borderline cardiomegaly. Electronically Signed   By: Jeb Levering M.D.   On: 08/11/2017 21:40   Mr Brain Wo Contrast  Result Date: 08/11/2017 CLINICAL DATA:  Stroke follow-up EXAM: MRI HEAD WITHOUT CONTRAST MRA HEAD WITHOUT CONTRAST TECHNIQUE: Multiplanar, multiecho pulse sequences of the brain and surrounding structures were obtained without intravenous contrast. Angiographic images of the head were obtained using MRA technique without contrast. COMPARISON:  Head CT 08/11/2017 FINDINGS: MRI HEAD FINDINGS Brain: The midline structures are normal. There is diffusion restriction within the right insular cortex and within the right middle cerebellar peduncle.  There are punctate foci of diffusion restriction within the superior right frontal and parietal white matter. There are multiple old infarcts of the cerebellum and lacunar infarcts basal ganglia. There is no mass effect or acute hemorrhage. No mass lesions. No chronic microhemorrhage or cerebral amyloid angiopathy. No hydrocephalus, age advanced atrophy or lobar predominant volume loss. No dural abnormality or extra-axial collection. Skull and upper cervical spine: The visualized skull base, calvarium, upper cervical spine and extracranial soft tissues are normal. Sinuses/Orbits: No fluid levels or advanced mucosal thickening. No mastoid effusion. Normal orbits. MRA HEAD FINDINGS Intracranial internal carotid arteries: Normal.  Anterior cerebral arteries: Diminutive A1 segment of the left anterior cerebral artery, normal variant. Normal right ACA. Middle cerebral arteries:  No occlusion or high grade stenosis. Posterior communicating arteries: Present on the right. Posterior cerebral arteries: Fetal predominant origin of the right PCA. Conventional left PCA. There is moderate narrowing of the distal P2 segment on the right. Basilar artery: Normal. Vertebral arteries: Codominant. Normal. Superior cerebellar arteries: Normal. Anterior inferior cerebellar arteries: Normal. Posterior inferior cerebellar arteries: Normal. IMPRESSION: 1. Acute ischemic infarct of the right insular cortex without associated hemorrhage or mass effect. 2. Additional scattered punctate foci of acute ischemia within the right middle cerebellar peduncle the superior right frontal and parietal white matter. 3. Multiple old cerebellar and bilateral basal ganglia infarcts. Chronic ischemic microangiopathy. 4. No emergent large vessel occlusion or high grade stenosis. Electronically Signed   By: Ulyses Jarred M.D.   On: 08/11/2017 22:37   Mr Jodene Nam Head Wo Contrast  Result Date: 08/11/2017 CLINICAL DATA:  Stroke follow-up EXAM: MRI HEAD WITHOUT CONTRAST MRA HEAD WITHOUT CONTRAST TECHNIQUE: Multiplanar, multiecho pulse sequences of the brain and surrounding structures were obtained without intravenous contrast. Angiographic images of the head were obtained using MRA technique without contrast. COMPARISON:  Head CT 08/11/2017 FINDINGS: MRI HEAD FINDINGS Brain: The midline structures are normal. There is diffusion restriction within the right insular cortex and within the right middle cerebellar peduncle. There are punctate foci of diffusion restriction within the superior right frontal and parietal white matter. There are multiple old infarcts of the cerebellum and lacunar infarcts basal ganglia. There is no mass effect or acute hemorrhage. No mass lesions. No chronic  microhemorrhage or cerebral amyloid angiopathy. No hydrocephalus, age advanced atrophy or lobar predominant volume loss. No dural abnormality or extra-axial collection. Skull and upper cervical spine: The visualized skull base, calvarium, upper cervical spine and extracranial soft tissues are normal. Sinuses/Orbits: No fluid levels or advanced mucosal thickening. No mastoid effusion. Normal orbits. MRA HEAD FINDINGS Intracranial internal carotid arteries: Normal. Anterior cerebral arteries: Diminutive A1 segment of the left anterior cerebral artery, normal variant. Normal right ACA. Middle cerebral arteries:  No occlusion or high grade stenosis. Posterior communicating arteries: Present on the right. Posterior cerebral arteries: Fetal predominant origin of the right PCA. Conventional left PCA. There is moderate narrowing of the distal P2 segment on the right. Basilar artery: Normal. Vertebral arteries: Codominant. Normal. Superior cerebellar arteries: Normal. Anterior inferior cerebellar arteries: Normal. Posterior inferior cerebellar arteries: Normal. IMPRESSION: 1. Acute ischemic infarct of the right insular cortex without associated hemorrhage or mass effect. 2. Additional scattered punctate foci of acute ischemia within the right middle cerebellar peduncle the superior right frontal and parietal white matter. 3. Multiple old cerebellar and bilateral basal ganglia infarcts. Chronic ischemic microangiopathy. 4. No emergent large vessel occlusion or high grade stenosis. Electronically Signed   By: Ulyses Jarred M.D.   On: 08/11/2017 22:37  Ct Head Code Stroke Wo Contrast  Result Date: 08/11/2017 CLINICAL DATA:  Code stroke. Slurred speech. Ataxia. Suspect stroke. History of hyperlipidemia . EXAM: CT HEAD WITHOUT CONTRAST TECHNIQUE: Contiguous axial images were obtained from the base of the skull through the vertex without intravenous contrast. COMPARISON:  None. FINDINGS: BRAIN: No intraparenchymal hemorrhage,  mass effect nor midline shift. The ventricles and sulci are normal for age. Patchy supratentorial white matter hypodensities within normal range for patient's age, though non-specific are most compatible with chronic small vessel ischemic disease. Focal RIGHT insular blurring of gray-white matter junction. Cystic LEFT thalamus lacunar infarct. RIGHT basal ganglia lacunar perivascular space, less likely lacunar infarct. No acute large vascular territory infarcts. No abnormal extra-axial fluid collections. Basal cisterns are patent. VASCULAR: Moderate calcific atherosclerosis of the carotid siphons. No dense MCA. SKULL: No skull fracture. Small LEFT parietal scalp hematoma without subcutaneous gas radiopaque foreign bodies. SINUSES/ORBITS: The mastoid air-cells and included paranasal sinuses are well-aerated.The included ocular globes and orbital contents are non-suspicious. OTHER: None. ASPECTS [facility] Stroke Program Early CT Score) - Ganglionic level infarction (caudate, lentiform nuclei, internal capsule, insula, M1-M3 cortex): 6 - Supraganglionic infarction (M4-M6 cortex): 3 Total score (0-10 with 10 being normal): 9 IMPRESSION: 1. RIGHT insular age-indeterminate infarct. RIGHT basal ganglia perivascular space versus lacunar infarct. 2. ASPECTS is 9 3. Mild chronic small vessel ischemic disease and old LEFT thalamus lacunar infarct. 4. Critical Value/emergent results were called by telephone at the time of interpretation on 08/11/2017 at 5:02 pm to Dr. Leonel Ramsay, Neurology , who verbally acknowledged these results. Electronically Signed   By: Elon Alas M.D.   On: 08/11/2017 17:06    2D ECHO: Study Conclusions  - Left ventricle: The cavity size was normal. Wall thickness was   increased in a pattern of mild LVH. Systolic function was mildly   reduced. The estimated ejection fraction was in the range of 45%   to 50%. There is akinesis of the apical myocardium. Doppler   parameters are consistent  with abnormal left ventricular   relaxation (grade 1 diastolic dysfunction). - Mitral valve: There was mild regurgitation. - Left atrium: The atrium was moderately dilated. - Right ventricle: The cavity size was mildly dilated. - Atrial septum: There was an atrial septal aneurysm.  Impressions:  - Apical akinesis with overall mildly reduced LV systolic function;   mild LVH; mild diastolic dysfunction; mild MR; moderate LAE; mild   RVE; atrial septal aneurysm.   Disposition and Follow-up: Discharge Instructions    Ambulatory referral to Neurology    Complete by:  As directed    Follow up with stroke clinic Cecille Rubin preferred, if not available, then consider [facility] Chestnut, [facility] or Jaynee Eagles whoever is available) at [facility] in about 6-8 weeks. Thanks.   Diet - low sodium heart healthy    Complete by:  As directed    Increase activity slowly    Complete by:  As directed        DISPOSITION: home    [place]    Nicoletta Dress, MD. Schedule an appointment as soon as possible for a visit in 2 week(s).   Specialty:  Internal Medicine Contact information: [address] [place] [address] [phone]        Sherren Mocha, MD. Schedule an appointment as soon as possible for a visit in 2 week(s).   Specialty:  Cardiology Contact information: [address] [place] [address] [phone]  [place] [facility] Follow up on 08/26/2017.   Specialty:  Cardiology Why:  9:30AM, wound check visit Contact information: [address] [place]       Dennie Bible, NP. Schedule an appointment as soon as possible for a visit in 6 week(s).   Specialty:  Family Medicine Contact information: [address] [place] [address] [phone]            Time spent on Discharge: 35 mins    Signed:   Estill Cotta M.D. [facility] 08/13/2017, 5:49 PM Pager: [phone]   Addendum: coding query Acute right insular cortex, punctate right middle cerebellar penduncle ischemic infarct, subacute right superior frontal and parietal white matter infarcts, embolic pattern   Ripudeep Rai M.D. [facility] 08/17/2017, 2:49 PM  Pager: [phone]

## 2017-08-13 NOTE — Progress Notes (Signed)
Discharge instruction reviewed with patient/family. All questions answered at this time. RXs given. Transport home by family.    Ave Filter, RN

## 2017-08-13 NOTE — Discharge Instructions (Signed)
Monitor implant site care °Keep incision clean and dry for 3 days. °You can remove outer dressing tomorrow. °Leave steri-strips (little pieces of tape) on until seen in the office for wound check appointment. °Call the office ([phone]) for redness, drainage, swelling, or fever. ° ° ° °

## 2017-08-13 NOTE — CV Procedure (Signed)
TRANSESOPHAGEAL ECHOCARDIOGRAM (TEE) NOTE  INDICATIONS: cryptogenic stroke  PROCEDURE:   Informed consent was obtained prior to the procedure. The risks, benefits and alternatives for the procedure were discussed and the patient comprehended these risks.  Risks include, but are not limited to, cough, sore throat, vomiting, nausea, somnolence, esophageal and stomach trauma or perforation, bleeding, low blood pressure, aspiration, pneumonia, infection, trauma to the teeth and death.    After a procedural time-out, the patient was given 4.5 mg versed and 50 mcg fentanyl for moderate sedation.  The patient's heart rate, blood pressure, and oxygen saturation are monitored continuously during the procedure.The oropharynx was anesthetized 10 cc of topical 1% viscous lidocaine and 2 cetacaine sprays.  The transesophageal probe was inserted in the esophagus and stomach without difficulty and multiple views were obtained.  The patient was kept under observation until the patient left the procedure room.  The period of conscious sedation is 23 minutes, of which I was present face-to-face 100% of this time. The patient left the procedure room in stable condition.   Agitated microbubble saline contrast was administered.  COMPLICATIONS:    There were no immediate complications.  Findings:  1. LEFT VENTRICLE: The left ventricular wall thickness is mildly increased.  The left ventricular cavity is normal in size. Wall motion demonstrates anteroapical hypokinesis.  LVEF is 45-50%.  2. RIGHT VENTRICLE:  The right ventricle is normal in structure and function without any thrombus or masses.    3. LEFT ATRIUM:  The left atrium is mildly dilated in size without any thrombus or masses.  There is not spontaneous echo contrast ("smoke") in the left atrium consistent with a low flow state.  4. LEFT ATRIAL APPENDAGE:  The left atrial appendage is free of any thrombus or masses. The appendage has single lobes.  Pulse doppler indicates moderate flow in the appendage.  5. ATRIAL SEPTUM:  The atrial septum is aneurysmal. There is a small PFO noted by color doppler with left to right flow predominantly. There is intermittent spontaneous and provoked right to left flow of saline microbubble contrast noted as well.  6. RIGHT ATRIUM:  The right atrium is normal in size and function without any thrombus or masses.  7. MITRAL VALVE:  The mitral valve is normal in structure and function with no regurgitation.  There were no vegetations or stenosis.  8. AORTIC VALVE:  The aortic valve is trileaflet, normal in structure and function with no regurgitation.  There were no vegetations or stenosis  9. TRICUSPID VALVE:  The tricuspid valve is normal in structure and function with trivial regurgitation.  There were no vegetations or stenosis  10.  PULMONIC VALVE:  The pulmonic valve is normal in structure and function with no regurgitation.  There were no vegetations or stenosis.   11. AORTIC ARCH, ASCENDING AND DESCENDING AORTA:  There was minimal Ron Parker et. Al, 1992) atherosclerosis of the ascending aorta, aortic arch, or proximal descending aorta.  12. PULMONARY VEINS: Anomalous pulmonary venous return was not noted.  13. PERICARDIUM: The pericardium appeared normal and non-thickened.  There is no pericardial effusion.  IMPRESSION:   1. PFO is present with bidirectional flow. 2. No LAA thrombus is noted 3. LVEF 45-50% with anteroapical hypokinesis 4. Witnessed apnea and loud snoring with airway obstruction  RECOMMENDATIONS:    1. Consider PFO closure based on current trial data suggesting benefit in patients with known embolic stroke. 2. The patient was noted to have snoring and apnea with minimal sedation along  with a small posterior oropharynx and large tongue. Would recommend outpatient OSA study.  Time Spent Directly with the Patient:  35 minutes   Pixie Casino, MD, [place]  Attending Cardiologist  Direct Dial: [phone]  Fax: [phone]  Website:  [email or web address] 08/13/2017, 11:33 AM

## 2017-08-14 ENCOUNTER — Encounter (HOSPITAL_COMMUNITY): Payer: Self-pay | Admitting: Internal Medicine

## 2017-08-14 ENCOUNTER — Telehealth: Payer: Self-pay | Admitting: Neurology

## 2017-08-14 NOTE — Telephone Encounter (Signed)
Message sent to Dr. Erlinda Hong for [facility] that pt refused to see sleep consult.

## 2017-08-14 NOTE — Telephone Encounter (Signed)
We tried. Thanks.   Rosalin Hawking, MD PhD Stroke Neurology 08/14/2017 4:37 PM

## 2017-08-14 NOTE — Telephone Encounter (Signed)
Dr. Erlinda Hong seen the pt in the hospital and put in a referral for pt to have a sleep consult. Pt was called on 08/14/17 to get him scheduled for a sleep consult. Pt called back and stated he does not need to see a sleep doctor.

## 2017-08-18 DIAGNOSIS — C61 Malignant neoplasm of prostate: Secondary | ICD-10-CM | POA: Diagnosis not present

## 2017-08-18 DIAGNOSIS — R351 Nocturia: Secondary | ICD-10-CM | POA: Diagnosis not present

## 2017-08-20 DIAGNOSIS — Z79899 Other long term (current) drug therapy: Secondary | ICD-10-CM | POA: Diagnosis not present

## 2017-08-20 DIAGNOSIS — Z6828 Body mass index (BMI) 28.0-28.9, adult: Secondary | ICD-10-CM | POA: Diagnosis not present

## 2017-08-20 DIAGNOSIS — I639 Cerebral infarction, unspecified: Secondary | ICD-10-CM | POA: Diagnosis not present

## 2017-08-20 DIAGNOSIS — R001 Bradycardia, unspecified: Secondary | ICD-10-CM | POA: Diagnosis not present

## 2017-08-20 DIAGNOSIS — I129 Hypertensive chronic kidney disease with stage 1 through stage 4 chronic kidney disease, or unspecified chronic kidney disease: Secondary | ICD-10-CM | POA: Diagnosis not present

## 2017-08-20 DIAGNOSIS — E785 Hyperlipidemia, unspecified: Secondary | ICD-10-CM | POA: Diagnosis not present

## 2017-08-26 ENCOUNTER — Ambulatory Visit: Payer: PPO

## 2017-09-04 ENCOUNTER — Encounter: Payer: Self-pay | Admitting: Physician Assistant

## 2017-09-04 ENCOUNTER — Ambulatory Visit (INDEPENDENT_AMBULATORY_CARE_PROVIDER_SITE_OTHER): Payer: PPO | Admitting: Physician Assistant

## 2017-09-04 ENCOUNTER — Ambulatory Visit (INDEPENDENT_AMBULATORY_CARE_PROVIDER_SITE_OTHER): Payer: Self-pay | Admitting: *Deleted

## 2017-09-04 ENCOUNTER — Ambulatory Visit: Payer: PPO | Admitting: Physician Assistant

## 2017-09-04 ENCOUNTER — Telehealth: Payer: Self-pay

## 2017-09-04 VITALS — BP 130/80 | HR 44 | Resp 16 | Ht 71.0 in | Wt 199.1 lb

## 2017-09-04 DIAGNOSIS — I2510 Atherosclerotic heart disease of native coronary artery without angina pectoris: Secondary | ICD-10-CM | POA: Diagnosis not present

## 2017-09-04 DIAGNOSIS — N179 Acute kidney failure, unspecified: Secondary | ICD-10-CM

## 2017-09-04 DIAGNOSIS — Z8673 Personal history of transient ischemic attack (TIA), and cerebral infarction without residual deficits: Secondary | ICD-10-CM

## 2017-09-04 DIAGNOSIS — R001 Bradycardia, unspecified: Secondary | ICD-10-CM | POA: Diagnosis not present

## 2017-09-04 DIAGNOSIS — D649 Anemia, unspecified: Secondary | ICD-10-CM

## 2017-09-04 DIAGNOSIS — Q2112 Patent foramen ovale: Secondary | ICD-10-CM

## 2017-09-04 DIAGNOSIS — Q211 Atrial septal defect: Secondary | ICD-10-CM | POA: Diagnosis not present

## 2017-09-04 DIAGNOSIS — I255 Ischemic cardiomyopathy: Secondary | ICD-10-CM

## 2017-09-04 DIAGNOSIS — I639 Cerebral infarction, unspecified: Secondary | ICD-10-CM

## 2017-09-04 LAB — CUP PACEART INCLINIC DEVICE CHECK
Date Time Interrogation Session: 20181018163133
MDC IDC PG IMPLANT DT: 20180926

## 2017-09-04 NOTE — Telephone Encounter (Signed)
See messaging below for details.   Received message to schedule patient for PFO consult. Called and reiterated to patient that Dr. Erlinda Hong recommended to avoid PFO closure, but offered appointment with Dr. Burt Knack to discuss further. Patient politely declined consult. He understands to call if he changes his mind.
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
## 2017-09-04 NOTE — Telephone Encounter (Signed)
Leanor Kail, PA  Theodoro Parma, RN      Previous Messages    ----- Message -----  From: Sherren Mocha, MD  Sent: 08/25/2017  1:50 PM  To: Barkley Boards, RN, Leanor Kail, PA  Subject: RE: Inpatient Notes                I would leave it. Vin can reiterate Dr Phoebe Sharps recommendation to avoid PFO closure and I'm fine to see him for formal consult if he wants to discuss further. With neurology recommending against PFO closure, I'm not going to do it unless there is some really unusual circumstance.  ----- Message -----  From: Barkley Boards, RN  Sent: 08/22/2017 12:21 PM  To: Sherren Mocha, MD  Subject: RE: Inpatient Notes                Reviewed pt's chart and he is scheduled to see Vin on 10/18 for OV and he has a wound check for ILR placement same day. You are not in the office. Do you want me to change the follow up?  ----- Message -----  From: Sherren Mocha, MD  Sent: 08/14/2017  8:18 AM  To: Barkley Boards, RN, Rosalin Hawking, MD  Subject: RE: Inpatient Notes                Thanks for the heads up Maxeys. Will plan on medical therapy when I see him.   Ronalee Belts  ----- Message -----  From: Rosalin Hawking, MD  Sent: 08/13/2017  5:44 PM  To: Sherren Mocha, MD  Subject: Inpatient Notes                  Frederich Chick:  This pt had cortical infarct, TEE found small PFO. Loop placed. Dr. Debara Pickett recommended PFO closure. However, pt is 76 yo, multiple stroke risk factors including CAD s/p stent, HTN, HLD, OSA, previous cardiomyopathy, I think he is not a good candidate for PFO closure. Anyway, he will see you as follow up and just FYI. Thanks. - Jindong

## 2017-09-04 NOTE — Progress Notes (Signed)
Wound check appointment. Steri-strips removed. Wound without redness or edema. Incision edges approximated, wound well healed. Battery status: Good. R-waves 0.12 mV. 0 symptom episodes, 0 tachy episodes, 0 pause episodes, 0 brady episodes. 0 AF episodes (0% burden). Monthly summary reports and ROV with GT PRN.

## 2017-09-04 NOTE — Patient Instructions (Signed)
Medication Instructions: Your physician recommends that you continue on your current medications as directed. Please refer to the Current Medication list given to you today.  Labwork: Your physician recommends that you have lab work today: CBC and BMET  Procedures/Testing: None Ordered  Follow-Up: Your physician recommends that you schedule a follow-up appointment first available with Dr. Burt Knack for evaluation of Patent Foramen Ovale (PFO)  Your physician wants you to follow-up in: 6 MONTHS with Dr. Marlou Porch. You will receive a reminder letter in the mail two months in advance. If you don't receive a letter, please call our office to schedule the follow-up appointment.   Any Additional Special Instructions Will Be Listed Below (If Applicable).  --Please call our office if you decide to proceed with having a sleep study--  If you need a refill on your cardiac medications before your next appointment, please call your pharmacy.

## 2017-09-05 ENCOUNTER — Telehealth: Payer: Self-pay | Admitting: Physician Assistant

## 2017-09-05 ENCOUNTER — Telehealth: Payer: Self-pay

## 2017-09-05 LAB — CBC WITH DIFFERENTIAL/PLATELET
BASOS ABS: 0 10*3/uL (ref 0.0–0.2)
BASOS: 0 %
EOS (ABSOLUTE): 0.1 10*3/uL (ref 0.0–0.4)
Eos: 2 %
Hematocrit: 41.5 % (ref 37.5–51.0)
Hemoglobin: 14 g/dL (ref 13.0–17.7)
IMMATURE GRANS (ABS): 0 10*3/uL (ref 0.0–0.1)
IMMATURE GRANULOCYTES: 0 %
LYMPHS: 19 %
Lymphocytes Absolute: 1.7 10*3/uL (ref 0.7–3.1)
MCH: 30.5 pg (ref 26.6–33.0)
MCHC: 33.7 g/dL (ref 31.5–35.7)
MCV: 90 fL (ref 79–97)
MONOS ABS: 0.8 10*3/uL (ref 0.1–0.9)
Monocytes: 9 %
NEUTROS PCT: 70 %
Neutrophils Absolute: 6.6 10*3/uL (ref 1.4–7.0)
PLATELETS: 223 10*3/uL (ref 150–379)
RBC: 4.59 x10E6/uL (ref 4.14–5.80)
RDW: 13.9 % (ref 12.3–15.4)
WBC: 9.4 10*3/uL (ref 3.4–10.8)

## 2017-09-05 LAB — BASIC METABOLIC PANEL
BUN/Creatinine Ratio: 18 (ref 10–24)
BUN: 20 mg/dL (ref 8–27)
CALCIUM: 9.7 mg/dL (ref 8.6–10.2)
CHLORIDE: 105 mmol/L (ref 96–106)
CO2: 26 mmol/L (ref 20–29)
Creatinine, Ser: 1.11 mg/dL (ref 0.76–1.27)
GFR calc non Af Amer: 64 mL/min/{1.73_m2} (ref >59)
GFR, EST AFRICAN AMERICAN: 74 mL/min/{1.73_m2} (ref >59)
GLUCOSE: 112 mg/dL — AB (ref 65–99)
POTASSIUM: 4.4 mmol/L (ref 3.5–5.2)
Sodium: 147 mmol/L — ABNORMAL HIGH (ref 134–144)

## 2017-09-05 NOTE — Telephone Encounter (Signed)
New message ° ° ° ° °Patient returning call for results. Please call °

## 2017-09-05 NOTE — Telephone Encounter (Signed)
Call was returned to pt by Hoover Browns.

## 2017-09-05 NOTE — Telephone Encounter (Signed)
spoke with patient about recent lab results. patient verbalized understanding. patient agreed to F/U with PCP about glucose level. patient agreed to drink more water but not exceed 2 Liters. copy sent to PCP. Patient thanked me for my call.

## 2017-09-12 ENCOUNTER — Ambulatory Visit (INDEPENDENT_AMBULATORY_CARE_PROVIDER_SITE_OTHER): Payer: PPO | Admitting: *Deleted

## 2017-09-12 DIAGNOSIS — I639 Cerebral infarction, unspecified: Secondary | ICD-10-CM | POA: Diagnosis not present

## 2017-09-15 ENCOUNTER — Ambulatory Visit: Payer: PPO | Admitting: Physician Assistant

## 2017-09-19 NOTE — Progress Notes (Signed)
Carelink Summary Report / Loop Recorder 

## 2017-10-13 ENCOUNTER — Ambulatory Visit (INDEPENDENT_AMBULATORY_CARE_PROVIDER_SITE_OTHER): Payer: PPO | Admitting: *Deleted

## 2017-10-13 ENCOUNTER — Ambulatory Visit: Payer: Self-pay | Admitting: Neurology

## 2017-10-13 DIAGNOSIS — I639 Cerebral infarction, unspecified: Secondary | ICD-10-CM

## 2017-10-14 NOTE — Progress Notes (Signed)
Carelink Summary Report / Loop Recorder 

## 2017-10-15 ENCOUNTER — Other Ambulatory Visit: Payer: Self-pay | Admitting: Cardiology

## 2017-10-23 LAB — CUP PACEART REMOTE DEVICE CHECK
Implantable Pulse Generator Implant Date: 20180926
MDC IDC SESS DTM: 20181125203818

## 2017-11-12 ENCOUNTER — Ambulatory Visit (INDEPENDENT_AMBULATORY_CARE_PROVIDER_SITE_OTHER): Payer: PPO | Admitting: *Deleted

## 2017-11-12 DIAGNOSIS — I639 Cerebral infarction, unspecified: Secondary | ICD-10-CM

## 2017-11-13 NOTE — Progress Notes (Signed)
Carelink Summary Report / Loop Recorder 

## 2017-11-18 ENCOUNTER — Other Ambulatory Visit: Payer: Self-pay | Admitting: Cardiology

## 2017-11-23 LAB — CUP PACEART REMOTE DEVICE CHECK
Implantable Pulse Generator Implant Date: 20180926
MDC IDC SESS DTM: 20181225203848

## 2017-12-11 ENCOUNTER — Ambulatory Visit (INDEPENDENT_AMBULATORY_CARE_PROVIDER_SITE_OTHER): Payer: Medicare HMO | Admitting: *Deleted

## 2017-12-11 DIAGNOSIS — I639 Cerebral infarction, unspecified: Secondary | ICD-10-CM | POA: Diagnosis not present

## 2017-12-12 NOTE — Progress Notes (Signed)
Carelink Summary Report / Loop Recorder 

## 2017-12-19 LAB — CUP PACEART REMOTE DEVICE CHECK
Date Time Interrogation Session: 20190124210737
MDC IDC PG IMPLANT DT: 20180926

## 2017-12-22 DIAGNOSIS — R351 Nocturia: Secondary | ICD-10-CM | POA: Diagnosis not present

## 2017-12-22 DIAGNOSIS — C61 Malignant neoplasm of prostate: Secondary | ICD-10-CM | POA: Diagnosis not present

## 2017-12-29 DIAGNOSIS — H524 Presbyopia: Secondary | ICD-10-CM | POA: Diagnosis not present

## 2017-12-29 DIAGNOSIS — H40033 Anatomical narrow angle, bilateral: Secondary | ICD-10-CM | POA: Diagnosis not present

## 2018-01-13 ENCOUNTER — Ambulatory Visit (INDEPENDENT_AMBULATORY_CARE_PROVIDER_SITE_OTHER): Payer: Medicare HMO | Admitting: *Deleted

## 2018-01-13 DIAGNOSIS — Z6828 Body mass index (BMI) 28.0-28.9, adult: Secondary | ICD-10-CM | POA: Diagnosis not present

## 2018-01-13 DIAGNOSIS — I639 Cerebral infarction, unspecified: Secondary | ICD-10-CM | POA: Diagnosis not present

## 2018-01-13 DIAGNOSIS — E785 Hyperlipidemia, unspecified: Secondary | ICD-10-CM | POA: Diagnosis not present

## 2018-01-13 DIAGNOSIS — N183 Chronic kidney disease, stage 3 (moderate): Secondary | ICD-10-CM | POA: Diagnosis not present

## 2018-01-13 DIAGNOSIS — I2510 Atherosclerotic heart disease of native coronary artery without angina pectoris: Secondary | ICD-10-CM | POA: Diagnosis not present

## 2018-01-13 DIAGNOSIS — Z1331 Encounter for screening for depression: Secondary | ICD-10-CM | POA: Diagnosis not present

## 2018-01-13 DIAGNOSIS — I129 Hypertensive chronic kidney disease with stage 1 through stage 4 chronic kidney disease, or unspecified chronic kidney disease: Secondary | ICD-10-CM | POA: Diagnosis not present

## 2018-01-13 DIAGNOSIS — R7301 Impaired fasting glucose: Secondary | ICD-10-CM | POA: Diagnosis not present

## 2018-01-14 NOTE — Progress Notes (Signed)
Carelink Summary Report / Loop Recorder 

## 2018-02-16 ENCOUNTER — Ambulatory Visit (INDEPENDENT_AMBULATORY_CARE_PROVIDER_SITE_OTHER): Payer: Medicare HMO | Admitting: *Deleted

## 2018-02-16 DIAGNOSIS — I639 Cerebral infarction, unspecified: Secondary | ICD-10-CM

## 2018-02-16 NOTE — Progress Notes (Signed)
Carelink Summary Report / Loop Recorder 

## 2018-02-17 DIAGNOSIS — H40033 Anatomical narrow angle, bilateral: Secondary | ICD-10-CM | POA: Diagnosis not present

## 2018-02-18 LAB — CUP PACEART REMOTE DEVICE CHECK
Date Time Interrogation Session: 20190227011109
MDC IDC PG IMPLANT DT: 20180926

## 2018-03-06 ENCOUNTER — Ambulatory Visit: Payer: PPO | Admitting: Cardiology

## 2018-03-20 ENCOUNTER — Ambulatory Visit (INDEPENDENT_AMBULATORY_CARE_PROVIDER_SITE_OTHER): Payer: Medicare HMO | Admitting: *Deleted

## 2018-03-20 DIAGNOSIS — I639 Cerebral infarction, unspecified: Secondary | ICD-10-CM | POA: Diagnosis not present

## 2018-03-23 LAB — CUP PACEART REMOTE DEVICE CHECK
MDC IDC PG IMPLANT DT: 20180926
MDC IDC SESS DTM: 20190401013648

## 2018-03-23 NOTE — Progress Notes (Signed)
Carelink Summary Report / Loop Recorder 

## 2018-04-02 DIAGNOSIS — Z125 Encounter for screening for malignant neoplasm of prostate: Secondary | ICD-10-CM | POA: Diagnosis not present

## 2018-04-02 DIAGNOSIS — E785 Hyperlipidemia, unspecified: Secondary | ICD-10-CM | POA: Diagnosis not present

## 2018-04-02 DIAGNOSIS — Z136 Encounter for screening for cardiovascular disorders: Secondary | ICD-10-CM | POA: Diagnosis not present

## 2018-04-02 DIAGNOSIS — Z9181 History of falling: Secondary | ICD-10-CM | POA: Diagnosis not present

## 2018-04-02 DIAGNOSIS — Z1331 Encounter for screening for depression: Secondary | ICD-10-CM | POA: Diagnosis not present

## 2018-04-02 DIAGNOSIS — Z0000 Encounter for general adult medical examination without abnormal findings: Secondary | ICD-10-CM | POA: Diagnosis not present

## 2018-04-14 LAB — CUP PACEART REMOTE DEVICE CHECK
Implantable Pulse Generator Implant Date: 20180926
MDC IDC SESS DTM: 20190504014013

## 2018-04-22 ENCOUNTER — Ambulatory Visit (INDEPENDENT_AMBULATORY_CARE_PROVIDER_SITE_OTHER): Payer: Medicare HMO | Admitting: *Deleted

## 2018-04-22 DIAGNOSIS — I639 Cerebral infarction, unspecified: Secondary | ICD-10-CM

## 2018-04-23 NOTE — Progress Notes (Signed)
Carelink Summary Report / Loop Recorder 

## 2018-04-27 ENCOUNTER — Ambulatory Visit: Payer: Medicare HMO | Admitting: Cardiology

## 2018-04-27 ENCOUNTER — Encounter: Payer: Self-pay | Admitting: Cardiology

## 2018-04-27 VITALS — BP 130/80 | HR 56 | Ht 71.0 in | Wt 202.8 lb

## 2018-04-27 DIAGNOSIS — Q211 Atrial septal defect: Secondary | ICD-10-CM

## 2018-04-27 DIAGNOSIS — Q2112 Patent foramen ovale: Secondary | ICD-10-CM

## 2018-04-27 DIAGNOSIS — R001 Bradycardia, unspecified: Secondary | ICD-10-CM | POA: Diagnosis not present

## 2018-04-27 DIAGNOSIS — I639 Cerebral infarction, unspecified: Secondary | ICD-10-CM

## 2018-04-27 MED ORDER — AMLODIPINE BESYLATE 2.5 MG PO TABS: 2.5000 mg | ORAL_TABLET | Freq: Every day | ORAL | 3 refills | Status: DC

## 2018-04-27 MED ORDER — ATORVASTATIN CALCIUM 40 MG PO TABS: 40.0000 mg | ORAL_TABLET | Freq: Every day | ORAL | 3 refills | Status: DC

## 2018-04-27 MED ORDER — LOSARTAN POTASSIUM 100 MG PO TABS: 100.0000 mg | ORAL_TABLET | Freq: Every day | ORAL | 3 refills | Status: DC

## 2018-04-27 NOTE — Patient Instructions (Signed)

## 2018-04-27 NOTE — Progress Notes (Signed)
Cardiology Office Note   Date:  04/27/2018   ID:  Samuel Walls, DOB 11-29-1940, MRN [id number]  PCP:  Nicoletta Dress, MD  Cardiologist:   Candee Furbish, MD (former Ron Parker)    History of Present Illness: Samuel Walls is a 77 y.o. male who presents for follow up coronary disease, cryptogenic stroke 2018. He is doing well. He has sinus bradycardia that is asymptomatic. We are not able to use any beta blockers. He had left ventricular dysfunction in the past. Fortunately this improved over time with an ARB. He is not having any significant chest pain or shortness of breath. His most recent echo showed improvement of his ejection fraction up to 50% .   No syncopal episodes, does not seem to be affected by his chronic bradycardia. No chest pain, no fevers, no chills , no bleeding.  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   He is from [facility]  04/27/2018- cryptogenic stroke 2018.  Loop recorder.  Overall been doing quite well.  On high-dose aspirin because of stroke.  Small PFO noted.  Continue with aspirin.  He did have questions about loop recorder.  Denies chest pain fevers chills nausea vomiting syncope   Past Medical History:  Diagnosis Date  . BPH (benign prostatic hyperplasia)   . CAD (coronary artery disease) cardiologist-  dr Joellyn Quails   a.  NSTEMI  05-31-2012 s/p  DES to pLAD (troponin >20).  . Chronic sinus bradycardia   . History of non-ST elevation myocardial infarction (NSTEMI)    07/ 2013   s/p  DES to LAD  . Hyperlipidemia LDL goal < 70   . Ischemic cardiomyopathy    a. 05/2012 per cardiac cath EF 35%. b. Subsequent echoes have shown EF improved to 45-50%.  . Nocturia   . PFO (patent foramen ovale)   . Prostate cancer [facility]) urologist-  dr chao/  oncologist-  dr Tammi Klippel   T1c, Gleason 7,  PSA 4.9  . S/P drug eluting coronary stent placement    05-31-2012   DES x1  to pLAD  . Stroke (cerebrum) ([place]) 07/2017  . Wears glasses     Past  Surgical History:  Procedure Laterality Date  . ANKLE CLOSED REDUCTION Left 2000  . LEFT HEART CATHETERIZATION WITH CORONARY ANGIOGRAM N/A 05/31/2012   Procedure: LEFT HEART CATHETERIZATION WITH CORONARY ANGIOGRAM;  Surgeon: Sherren Mocha, MD;  Location: [facility] CATH LAB;  Service: Cardiovascular;  Laterality: N/A;  PCI w/ DES x1 to pLAD (95%);  moderate stenoses of RCA and CLx;  moderate severe segmental LV dysfunction, ef 35%  . LOOP RECORDER INSERTION N/A 08/13/2017   Procedure: LOOP RECORDER INSERTION;  Surgeon: Evans Lance, MD;  Location: [facility] CV LAB;  Service: Cardiovascular;  Laterality: N/A;  . PROSTATE BIOPSY    . RADIOACTIVE SEED IMPLANT N/A 01/02/2017   Procedure: RADIOACTIVE SEED IMPLANT/BRACHYTHERAPY IMPLANT;  Surgeon: Joie Bimler, MD;  Location: [facility];  Service: Urology;  Laterality: N/A;  . TEE WITHOUT CARDIOVERSION N/A 08/13/2017   Procedure: TRANSESOPHAGEAL ECHOCARDIOGRAM (TEE);  Surgeon: Pixie Casino, MD;  Location: [facility] ENDOSCOPY;  Service: Cardiovascular;  Laterality: N/A;  . TRANSTHORACIC ECHOCARDIOGRAM  11/29/2013   midl focal basal LVH of the septum,  EF 50%, akinesis of the mid-distalanteroseptal and apical myocardium,  grade 1 diastolic dysfunction/  mild LAE/  mild dilated aortic root/  trivial MR and TR     Current Outpatient Medications  Medication Sig Dispense Refill  .  amLODipine (NORVASC) 2.5 MG tablet Take 1 tablet (2.5 mg total) by mouth daily. 30 tablet 3  . aspirin 325 MG tablet Take 1 tablet (325 mg total) by mouth daily. 30 tablet 3  . atorvastatin (LIPITOR) 40 MG tablet Take 1 tablet (40 mg total) by mouth daily. 30 tablet 3  . losartan (COZAAR) 100 MG tablet Take 1 tablet (100 mg total) by mouth daily. 30 tablet 3  . Multiple Vitamin (MULTIVITAMIN WITH MINERALS) TABS Take 1 tablet by mouth every morning.     . nitroGLYCERIN (NITROSTAT) 0.4 MG SL tablet DISSOLVE ONE TABLET UNDER THE TONGUE EVERY 5 MINUTES AS NEEDED FOR CHEST  PAIN.  DO NOT EXCEED A TOTAL OF 3 DOSES IN 15 MINUTES 25 tablet 5  . omega-3 acid ethyl esters (LOVAZA) 1 G capsule Take 2 g by mouth every evening.      No current facility-administered medications for this visit.     Allergies:   Hydrocodone    Social History:  The patient  reports that he has never smoked. He has never used smokeless tobacco. He reports that he does not drink alcohol or use drugs.   Family History:  The patient's family history includes ALS in his mother; Other in his father.    ROS:  Please see the history of present illness.   Otherwise, review of systems are positive for none.   All other systems are reviewed and negative.    PHYSICAL EXAM: VS:  BP 130/80   Pulse (!) 56   Ht 5\' 11"  (1.803 m)   Wt 202 lb 12.8 oz (92 kg)   SpO2 97%   BMI 28.28 kg/m  , BMI Body mass index is 28.28 kg/m. GEN: Well nourished, well developed, in no acute distress HEENT: normal Neck: no JVD, carotid bruits, or masses Cardiac: Bradycardic regular no murmurs, rubs, or gallops,no edema  Respiratory:  clear to auscultation bilaterally, normal work of breathing GI: soft, nontender, nondistended, + BS MS: no deformity or atrophy Skin: warm and dry, no rash Neuro:  Alert and Oriented x 3, Strength and sensation are intact Psych: euthymic mood, full affect    EKG:   Sinus bradycardia rate 46, old anterior lateral infarct pattern , nonspecific T wave changes. Personally viewed. Previous on 10/26/14 showed sinus bradycardia with PVC 4   Recent Labs: 08/11/2017: ALT 20 09/04/2017: BUN 20; Creatinine, Ser 1.11; Hemoglobin 14.0; Platelets 223; Potassium 4.4; Sodium 147    Lipid Panel    Component Value Date/Time   CHOL 101 08/12/2017 0233   TRIG 90 08/12/2017 0233   HDL 38 (L) 08/12/2017 0233   CHOLHDL 2.7 08/12/2017 0233   VLDL 18 08/12/2017 0233   LDLCALC 45 08/12/2017 0233      Wt Readings from Last 3 Encounters:  04/27/18 202 lb 12.8 oz (92 kg)  09/04/17 199 lb 1.9 oz  (90.3 kg)  08/13/17 200 lb (90.7 kg)      Other studies Reviewed: Additional studies/ records that were reviewed today include:  Prior office visit reviewed , lab work, EKG. Review of the above records demonstrates:  As above   ASSESSMENT AND PLAN:  Cryptogenic stroke - Loop recorder placed, Dr. Lovena Le.  He is monitoring.  Small PFO noted on TEE.  Continuing with aspirin.  No further stroke.  Bradycardia  -  Asymptomatic sinus bradycardia, no beta blockers , left ventricular function improved overtime without use of beta blocker. Stable. No change.Overall doing well.  Heart rate usually in the  75s 50s   Coronary artery disease /old MI  -  Non-STEMI in July 2013. DES to LAD.  -  Moderate scattered disease elsewhere.  -  EF improved from 35%.  To 45/50  -  No anginal symptoms.   Essential hypertension /hypertensive heart disease without heart failure  -  Stable , medications reviewed, no changes made   Hyperlipidemia  -  Goal-directed therapy  -  Statin  -  LDL 52 on 01/09/2018. Excellent. No changes, no myalgias.   Ischemic cardiomyopathy  - Prior EF 35 , now 45%. Reassurance.  - Continuing with low-dose losartan. Not on beta blocker because of previous bradycardia.   Doing well.  Current medicines are reviewed at length with the patient today.  The patient does not have concerns regarding medicines.  The following changes have been made:  no change  Labs/ tests ordered today include: none  No orders of the defined types were placed in this encounter.    Disposition:   FU with [facility] in 12 months Signed, Candee Furbish, MD  04/27/2018 3:32 PM    [facility] [address], [place], [place]  [address] Phone: [phone]; Fax: [phone]

## 2018-04-28 ENCOUNTER — Telehealth: Payer: Self-pay | Admitting: *Deleted

## 2018-04-28 DIAGNOSIS — I4891 Unspecified atrial fibrillation: Secondary | ICD-10-CM

## 2018-04-28 NOTE — Telephone Encounter (Signed)
Reviewed 1 available AF episode with Dr. Lovena Le. New AF noted, duration 1 hour 52 minute. Dr. Lovena Le ordered to START Xarelto 20 mg daily with dinner. Will advise patient of recommedation when he calls back.

## 2018-04-28 NOTE — Telephone Encounter (Signed)
LVMOM regarding sending manual transmission to review 2 AF episodes noted. [facility] phone number to call back.

## 2018-04-29 ENCOUNTER — Telehealth: Payer: Self-pay | Admitting: Internal Medicine

## 2018-04-29 MED ORDER — RIVAROXABAN 20 MG PO TABS: 20.0000 mg | ORAL_TABLET | Freq: Every day | ORAL | 11 refills | Status: DC

## 2018-04-29 NOTE — Telephone Encounter (Signed)
New Message:      Pt states that the new medication that was just prescribed to him in too high and he can not afford it. Pt states he does not know the name of the medication

## 2018-04-29 NOTE — Telephone Encounter (Signed)
I called the pts insurance company [employer] (histocompatibility and immunogenetics)) and was advised that the pt has a deductible that has not been met so the cost of his Xarelto is $107.48 for a 30 day supply. I was advised that I can try to get a tier exception and if approved it will lower his cost even though he has not met his deductible.  I have done a Xarelto tier exception through covermymeds. Key: [id number]

## 2018-04-29 NOTE — Telephone Encounter (Signed)
Patient returning your call. Please call him back on his mobile number.

## 2018-04-29 NOTE — Telephone Encounter (Signed)
Spoke with patient regarding New onset AFib. Advised patient Dr. Lovena Le recommends to START Xarelto 20 mg daily with dinner and STOP Aspirin. Educated patient about AFib and side effects of Xarelto. Patient verbalized understanding. Advised patient to schedule an appointment with Dr. Lovena Le 4-6 weeks after starting Xarelto. Patient requested to call back at a later time to make appointment. Confirmed patient's pharmacy and sent order to [facility] in [place].

## 2018-04-29 NOTE — Telephone Encounter (Signed)
Since Dr Lovena Le started this medication will you check with him to see if pt can take something else d/t cost.  Thank you   From 04/28/18 phone note by Lawernce Ion, RN - Spoke with patient regarding New onset AFib. Advised patient Dr. Lovena Le recommends to START Xarelto 20 mg daily with dinner and STOP Aspirin. Educated patient about AFib and side effects of Xarelto. Patient verbalized understanding. Advised patient to schedule an appointment with Dr. Lovena Le 4-6 weeks after starting Xarelto. Patient requested to call back at a later time to make appointment. Confirmed patient's pharmacy and sent order to [facility] in [place].

## 2018-04-30 NOTE — Telephone Encounter (Signed)
Call placed to Pt to discuss Xarelto cost.  Advised Pt samples and 30 day free card are at front desk for pickup.  Advised we have asked his insurance for a tier exception to help lower his copay.  Pt states he will have his wife pick up samples/card today.

## 2018-05-01 DIAGNOSIS — I2510 Atherosclerotic heart disease of native coronary artery without angina pectoris: Secondary | ICD-10-CM | POA: Diagnosis not present

## 2018-05-01 DIAGNOSIS — I10 Essential (primary) hypertension: Secondary | ICD-10-CM | POA: Diagnosis not present

## 2018-05-01 DIAGNOSIS — N529 Male erectile dysfunction, unspecified: Secondary | ICD-10-CM | POA: Diagnosis not present

## 2018-05-01 DIAGNOSIS — E785 Hyperlipidemia, unspecified: Secondary | ICD-10-CM | POA: Diagnosis not present

## 2018-05-01 DIAGNOSIS — Z7982 Long term (current) use of aspirin: Secondary | ICD-10-CM | POA: Diagnosis not present

## 2018-05-01 DIAGNOSIS — E669 Obesity, unspecified: Secondary | ICD-10-CM | POA: Diagnosis not present

## 2018-05-01 DIAGNOSIS — Z6830 Body mass index (BMI) 30.0-30.9, adult: Secondary | ICD-10-CM | POA: Diagnosis not present

## 2018-05-01 DIAGNOSIS — Z8546 Personal history of malignant neoplasm of prostate: Secondary | ICD-10-CM | POA: Diagnosis not present

## 2018-05-01 DIAGNOSIS — Z8673 Personal history of transient ischemic attack (TIA), and cerebral infarction without residual deficits: Secondary | ICD-10-CM | POA: Diagnosis not present

## 2018-05-01 DIAGNOSIS — Z7901 Long term (current) use of anticoagulants: Secondary | ICD-10-CM | POA: Diagnosis not present

## 2018-05-04 NOTE — Telephone Encounter (Signed)
**Note De-Identified Samuel Walls Obfuscation** The pt has been denied a tier exception for Xarelto per letter received [name] fax from [place].  Reason for denial: "Your drug is a name brand medication. It is already on the lowest possible copay level for a brand name drug. Therefore, we cannot cover your drug at a lower price".  An appeal form was included with the denial letter. I have completed and faxed it back to [facility] in hopes of an approval on the pts Xarelto tier exception.

## 2018-05-05 NOTE — Telephone Encounter (Addendum)
**Note De-Identified Mearle Drew Obfuscation** Letter received from [facility] Verlena Marlette fax stating that they have denied the appeal for a Xarelto tier exception. Reason: "You asked [place] for a tiering exception for Xarelto. It's on tier 3. This is your plans preferred brand tier. There are no brand medicines on the lower tiers. We dont cover brand medicine at a generic cost share. Since this medicine is already covered on the lowest available brand tier, we cant give you a lower price."  Will forward message to Dr Forde Dandy nurse as Juluis Rainier as she requested that I do this tier exception. I have faxed a copy to the pts pharmacy and sent a copy to be faxed into the pts chart.

## 2018-05-06 ENCOUNTER — Telehealth: Payer: Self-pay | Admitting: Internal Medicine

## 2018-05-06 NOTE — Telephone Encounter (Signed)
Called pt to make 4-6 week fu appt from 04-28-18 d/t start of Xarelto. Pt called back to state he is not going to start taking it because it is too expensive. Pls advise [phone]

## 2018-05-07 ENCOUNTER — Other Ambulatory Visit: Payer: Self-pay | Admitting: Internal Medicine

## 2018-05-14 NOTE — Telephone Encounter (Signed)
Call placed to Pt.  Pt did not start Xarelto because he states he can't afford it.  Advised Pt there are other blood thinners that are not as costly.  Asked Pt to speak with Dr. Lovena Le.  Pt agreeable-states he is taking care of his wife right now after her eye surgery-very busy right now.  Made appt for end of July.  Pt wants office to call to remind him because he won't remember.  Advised we would call him to remind him of appt.  No further action at this time.

## 2018-05-25 ENCOUNTER — Ambulatory Visit (INDEPENDENT_AMBULATORY_CARE_PROVIDER_SITE_OTHER): Payer: Medicare HMO | Admitting: *Deleted

## 2018-05-25 DIAGNOSIS — I639 Cerebral infarction, unspecified: Secondary | ICD-10-CM

## 2018-05-26 NOTE — Progress Notes (Signed)
Carelink Summary Report / Loop Recorder 

## 2018-05-29 LAB — CUP PACEART REMOTE DEVICE CHECK
Date Time Interrogation Session: 20190606020629
Implantable Pulse Generator Implant Date: 20180926

## 2018-06-10 DIAGNOSIS — N529 Male erectile dysfunction, unspecified: Secondary | ICD-10-CM | POA: Diagnosis not present

## 2018-06-10 DIAGNOSIS — C61 Malignant neoplasm of prostate: Secondary | ICD-10-CM | POA: Diagnosis not present

## 2018-06-10 DIAGNOSIS — R351 Nocturia: Secondary | ICD-10-CM | POA: Diagnosis not present

## 2018-06-17 ENCOUNTER — Ambulatory Visit: Payer: Medicare HMO | Admitting: Internal Medicine

## 2018-06-17 ENCOUNTER — Encounter: Payer: Self-pay | Admitting: Internal Medicine

## 2018-06-17 VITALS — BP 130/64 | HR 48 | Ht 71.0 in | Wt 204.0 lb

## 2018-06-17 DIAGNOSIS — I4891 Unspecified atrial fibrillation: Secondary | ICD-10-CM

## 2018-06-17 DIAGNOSIS — I639 Cerebral infarction, unspecified: Secondary | ICD-10-CM | POA: Diagnosis not present

## 2018-06-17 MED ORDER — WARFARIN SODIUM 5 MG PO TABS: 5.0000 mg | ORAL_TABLET | Freq: Every day | ORAL | 3 refills | Status: DC

## 2018-06-17 NOTE — Progress Notes (Signed)
HPI 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 He has done well in the interim, His neuro symmptoms have resolved. He has been found to have atrial fib and has refused Eliquis based on the price. He denies chest pain or sob. No syncope.  Allergies  Allergen Reactions  . Hydrocodone Other (See Comments)    vicodin --"makes me crazy"     Current Outpatient Medications  Medication Sig Dispense Refill  . amLODipine (NORVASC) 2.5 MG tablet Take 1 tablet (2.5 mg total) by mouth daily. 90 tablet 3  . atorvastatin (LIPITOR) 40 MG tablet Take 1 tablet (40 mg total) by mouth daily. 90 tablet 3  . losartan (COZAAR) 100 MG tablet Take 1 tablet (100 mg total) by mouth daily. 90 tablet 3  . Multiple Vitamin (MULTIVITAMIN WITH MINERALS) TABS Take 1 tablet by mouth every morning.     . nitroGLYCERIN (NITROSTAT) 0.4 MG SL tablet DISSOLVE ONE TABLET UNDER THE TONGUE EVERY 5 MINUTES AS NEEDED FOR CHEST PAIN.  DO NOT EXCEED A TOTAL OF 3 DOSES IN 15 MINUTES 25 tablet 5  . omega-3 acid ethyl esters (LOVAZA) 1 G capsule Take 2 g by mouth every evening.     . tadalafil (CIALIS) 5 MG tablet Take 5 mg by mouth daily.  3   No current facility-administered medications for this visit.      Past Medical History:  Diagnosis Date  . BPH (benign prostatic hyperplasia)   . CAD (coronary artery disease) cardiologist-  dr Samuel Walls   a.  NSTEMI  05-31-2012 s/p  DES to pLAD (troponin >20).  . Chronic sinus bradycardia   . History of non-ST elevation myocardial infarction (NSTEMI)    07/ 2013   s/p  DES to LAD  . Hyperlipidemia LDL goal < 70   . Ischemic cardiomyopathy    a. 05/2012 per cardiac cath EF 35%. b. Subsequent echoes have shown EF improved to 45-50%.  . Nocturia   . PFO (patent foramen ovale)   . Prostate cancer Samuel Walls) urologist-  dr Samuel Walls/  oncologist-  dr Samuel Walls   T1c, Gleason 7,  PSA 4.9  . S/P drug eluting coronary stent placement    05-31-2012   DES x1  to  pLAD  . Stroke (cerebrum) (Samuel Walls) 07/2017  . Wears glasses     ROS:   All systems reviewed and negative except as noted in the HPI.   Past Surgical History:  Procedure Laterality Date  . ANKLE CLOSED REDUCTION Left 2000  . LEFT HEART CATHETERIZATION WITH CORONARY ANGIOGRAM N/A 05/31/2012   Procedure: LEFT HEART CATHETERIZATION WITH CORONARY ANGIOGRAM;  Surgeon: Samuel Mocha, MD;  Location: [facility] CATH LAB;  Service: Cardiovascular;  Laterality: N/A;  PCI w/ DES x1 to pLAD (95%);  moderate stenoses of RCA and CLx;  moderate severe segmental LV dysfunction, ef 35%  . LOOP RECORDER INSERTION N/A 08/13/2017   Procedure: LOOP RECORDER INSERTION;  Surgeon: Samuel Lance, MD;  Location: [place] CV LAB;  Service: Cardiovascular;  Laterality: N/A;  . PROSTATE BIOPSY    . RADIOACTIVE SEED IMPLANT N/A 01/02/2017   Procedure: RADIOACTIVE SEED IMPLANT/BRACHYTHERAPY IMPLANT;  Surgeon: Samuel Bimler, MD;  Location: [facility];  Service: Urology;  Laterality: N/A;  . TEE WITHOUT CARDIOVERSION N/A 08/13/2017   Procedure: TRANSESOPHAGEAL ECHOCARDIOGRAM (TEE);  Surgeon: Samuel Casino, MD;  Location: [facility] ENDOSCOPY;  Service: Cardiovascular;  Laterality: N/A;  . TRANSTHORACIC ECHOCARDIOGRAM  11/29/2013  midl focal basal LVH of the septum,  EF 50%, akinesis of the mid-distalanteroseptal and apical myocardium,  grade 1 diastolic dysfunction/  mild LAE/  mild dilated aortic root/  trivial MR and TR     Family History  Problem Relation Age of Onset  . ALS Mother   . Other Father   . Cancer Neg Hx      Social History   Socioeconomic History  . Marital status: Married    Spouse name: Not on file  . Number of children: Not on file  . Years of education: Not on file  . Highest education level: Not on file  Occupational History  . Not on file  Social Needs  . Financial resource strain: Not on file  . Food insecurity:    Worry: Not on file    Inability: Not on file  .  Transportation needs:    Medical: Not on file    Non-medical: Not on file  Tobacco Use  . Smoking status: Never Smoker  . Smokeless tobacco: Never Used  Substance and Sexual Activity  . Alcohol use: No  . Drug use: No  . Sexual activity: Not on file  Lifestyle  . Physical activity:    Days per week: Not on file    Minutes per session: Not on file  . Stress: Not on file  Relationships  . Social connections:    Talks on phone: Not on file    Gets together: Not on file    Attends religious service: Not on file    Active member of club or organization: Not on file    Attends meetings of clubs or organizations: Not on file    Relationship status: Not on file  . Intimate partner violence:    Fear of current or ex partner: Not on file    Emotionally abused: Not on file    Physically abused: Not on file    Forced sexual activity: Not on file  Other Topics Concern  . Not on file  Social History Narrative  . Not on file     BP 130/64   Pulse (!) 48   Ht 5\' 11"  (1.803 m)   Wt 204 lb (92.5 kg)   SpO2 98%   BMI 28.45 kg/m   Physical Exam:  Well appearing 77 yo man, NAD HEENT: Unremarkable Neck:  6 cm JVD, no thyromegally Lymphatics:  No adenopathy Back:  No CVA tenderness Lungs:  Clear with no wheezes HEART:  Regular rate rhythm, no murmurs, no rubs, no clicks Abd:  soft, positive bowel sounds, no organomegally, no rebound, no guarding Ext:  2 plus pulses, no edema, no cyanosis, no clubbing Skin:  No rashes no nodules Neuro:  CN II through XII intact, motor grossly intact  EKG  -nsr  DEVICE  Normal device function.  See PaceArt for details. A single episode of atrial fib for about 6 hours.  Assess/Plan: 1. PAF - he is doing well. He refuses an Samuel Walls. He will try warfarin. 2. Cryptogenic stroke - he will start warfarin as he refuses an Samuel Walls. 3. HTN - his blood pressure is well controlled. He will continue his current meds.   Samuel Walls.D.

## 2018-06-17 NOTE — Patient Instructions (Addendum)
Medication Instructions:  Your physician has recommended you make the following change in your medication:  1.  Start taking warfarin 5 mg one tablet by mouth daily at bedtime.  Labwork: None ordered.  Testing/Procedures: None ordered.  Follow-Up:  You will see the coumadin clinic at the [facility] office in one week for coumadin follow up.  Your physician wants you to follow-up in: one year with Dr. Lovena Le.   You will receive a reminder letter in the mail two months in advance. If you don't receive a letter, please call our office to schedule the follow-up appointment.  Any Other Special Instructions Will Be Listed Below (If Applicable).  If you need a refill on your cardiac medications before your next appointment, please call your pharmacy.   Warfarin tablets What is this medicine? WARFARIN (WAR far in) is an anticoagulant. It is used to treat or prevent clots in the veins, arteries, lungs, or heart. This medicine may be used for other purposes; ask your health care provider or pharmacist if you have questions. COMMON BRAND NAME(S): Coumadin, Jantoven What should I tell my health care provider before I take this medicine? They need to know if you have any of these conditions: -alcoholism -anemia -bleeding disorders -cancer -diabetes -heart disease -high blood pressure -history of bleeding in the gastrointestinal tract -history of stroke or other brain injury or disease -kidney or liver disease -protein C deficiency -protein S deficiency -psychosis or dementia -recent injury, recent or planned surgery or procedure -an unusual or allergic reaction to warfarin, other medicines, foods, dyes, or preservatives -pregnant or trying to get pregnant -breast-feeding How should I use this medicine? Take this medicine by mouth with a glass of water. Follow the directions on the prescription label. You can take this medicine with or without food. Take your medicine at the same time  each day. Do not take it more often than directed. Do not stop taking except on your doctor's advice. Stopping this medicine may increase your risk of a blood clot. Be sure to refill your prescription before you run out of medicine. If your doctor or healthcare professional calls to change your dose, write down the dose and any other instructions. Always read the dose and instructions back to him or her to make sure you understand them. Tell your doctor or healthcare professional what strength of tablets you have on hand. Ask how many tablets you should take to equal your new dose. Write the date on the new instructions and keep them near your medicine. If you are told to stop taking your medicine until your next blood test, call your doctor or healthcare professional if you do not hear anything within 24 hours of the test to find out your new dose or when to restart your prior dose. A special MedGuide will be given to you by the pharmacist with each prescription and refill. Be sure to read this information carefully each time. Talk to your pediatrician regarding the use of this medicine in children. Special care may be needed. Overdosage: If you think you have taken too much of this medicine contact a poison control center or emergency room at once. NOTE: This medicine is only for you. Do not share this medicine with others. What if I miss a dose? It is important not to miss a dose. If you miss a dose, call your healthcare provider. Take the dose as soon as possible on the same day. If it is almost time for your next dose,  take only that dose. Do not take double or extra doses to make up for a missed dose. What may interact with this medicine? Do not take this medicine with any of the following medications: -agents that prevent or dissolve blood clots -aspirin or other salicylates -danshen -dextrothyroxine -mifepristone -St. John's Wort -red yeast rice This medicine may also interact with the  following medications: -acetaminophen -agents that lower cholesterol -alcohol -allopurinol -amiodarone -antibiotics or medicines for treating bacterial, fungal or viral infections -azathioprine -barbiturate medicines for inducing sleep or treating seizures -certain medicines for diabetes -certain medicines for heart rhythm problems -certain medicines for hepatitis C virus infections like daclatasvir, dasabuvir; ombitasvir; paritaprevir; ritonavir, elbasvir; grazoprevir, ledipasvir; sofosbuvir, simeprevir, sofosbuvir, sofosbuvir; velpatasvir, sofosbuvir; velpatasvir; voxilaprevir -certain medicines for high blood pressure -chloral hydrate -cisapride -conivaptan -disulfiram -male hormones, including contraceptive or birth control pills -general anesthetics -herbal or dietary products like garlic, ginkgo, ginseng, green tea, or kava kava -influenza virus vaccine -male hormones -medicines for mental depression or psychosis -medicines for some types of cancer -medicines for stomach problems -methylphenidate -NSAIDs, medicines for pain and inflammation, like ibuprofen or naproxen -propoxyphene -quinidine, quinine -raloxifene -seizure or epilepsy medicine like carbamazepine, phenytoin, and valproic acid -steroids like cortisone and prednisone -tamoxifen -thyroid medicine -tramadol -vitamin c, vitamin e, and vitamin K -zafirlukast -zileuton This list may not describe all possible interactions. Give your health care provider a list of all the medicines, herbs, non-prescription drugs, or dietary supplements you use. Also tell them if you smoke, drink alcohol, or use illegal drugs. Some items may interact with your medicine. What should I watch for while using this medicine? Visit your doctor or health care professional for regular checks on your progress. You will need to have a blood test called a PT/INR regularly. The PT/INR blood test is done to make sure you are getting the right  dose of this medicine. It is important to not miss your appointment for the blood tests. When you first start taking this medicine, these tests are done often. Once the correct dose is determined and you take your medicine properly, these tests can be done less often. Notify your doctor or health care professional and seek emergency treatment if you develop breathing problems; changes in vision; chest pain; severe, sudden headache; pain, swelling, warmth in the leg; trouble speaking; sudden numbness or weakness of the face, arm or leg. These can be signs that your condition has gotten worse. While you are taking this medicine, carry an identification card with your name, the name and dose of medicine(s) being used, and the name and phone number of your doctor or health care professional or person to contact in an emergency. Do not start taking or stop taking any medicines or over-the-counter medicines except on the advice of your doctor or health care professional. You should discuss your diet with your doctor or health care professional. Do not make major changes in your diet. Vitamin K can affect how well this medicine works. Many foods contain vitamin K. It is important to eat a consistent amount of foods with vitamin K. Other foods with vitamin K that you should eat in consistent amounts are asparagus, basil, black eyed peas, broccoli, brussel sprouts, cabbage, green onions, green tea, parsley, green leafy vegetables like beet greens, collard greens, kale, spinach, turnip greens, or certain lettuces like green leaf or romaine. This medicine can cause birth defects or bleeding in an unborn child. Women of childbearing age should use effective birth control while taking this  medicine. If a woman becomes pregnant while taking this medicine, she should discuss the potential risks and her options with her health care professional. Avoid sports and activities that might cause injury while you are using this  medicine. Severe falls or injuries can cause unseen bleeding. Be careful when using sharp tools or knives. Consider using an Copy. Take special care brushing or flossing your teeth. Report any injuries, bruising, or red spots on the skin to your doctor or health care professional. If you have an illness that causes vomiting, diarrhea, or fever for more than a few days, contact your doctor. Also check with your doctor if you are unable to eat for several days. These problems can change the effect of this medicine. Even after you stop taking this medicine, it takes several days before your body recovers its normal ability to clot blood. Ask your doctor or health care professional how long you need to be careful. If you are going to have surgery or dental work, tell your doctor or health care professional that you have been taking this medicine. What side effects may I notice from receiving this medicine? Side effects that you should report to your doctor or health care professional as soon as possible: -allergic reactions like skin rash, itching or hives, swelling of the face, lips, or tongue -breathing problems -chest pain -dizziness -headache -heavy menstrual bleeding or vaginal bleeding -pain in the lower back or side -painful, blue or purple toes -painful skin ulcers that do not go away -signs and symptoms of bleeding such as bloody or black, tarry stools; red or dark-brown urine; spitting up blood or brown material that looks like coffee grounds; red spots on the skin; unusual bruising or bleeding from the eye, gums, or nose -stomach pain -unusually weak or tired Side effects that usually do not require medical attention (report to your doctor or health care professional if they continue or are bothersome): -diarrhea -hair loss This list may not describe all possible side effects. Call your doctor for medical advice about side effects. You may report side effects to FDA at  1-800-FDA-1088. Where should I keep my medicine? Keep out of the reach of children. Store at room temperature between 15 and 30 degrees C (59 and 86 degrees F). Protect from light. Throw away any unused medicine after the expiration date. Do not flush down the toilet. NOTE: This sheet is a summary. It may not cover all possible information. If you have questions about this medicine, talk to your doctor, pharmacist, or health care provider.  2018 Elsevier/Gold Standard (2016-10-24 11:27:41)

## 2018-06-24 ENCOUNTER — Ambulatory Visit (INDEPENDENT_AMBULATORY_CARE_PROVIDER_SITE_OTHER): Payer: Medicare HMO | Admitting: *Deleted

## 2018-06-24 DIAGNOSIS — I639 Cerebral infarction, unspecified: Secondary | ICD-10-CM

## 2018-06-24 DIAGNOSIS — Z5181 Encounter for therapeutic drug level monitoring: Secondary | ICD-10-CM | POA: Diagnosis not present

## 2018-06-24 MED ORDER — RIVAROXABAN 20 MG PO TABS: 20.0000 mg | ORAL_TABLET | Freq: Every day | ORAL | 0 refills | Status: DC

## 2018-06-24 NOTE — Patient Instructions (Addendum)
Pt was started on Xarelto 20mg  daily for AFIB and CVA on today but pt states he will start on 06/25/18.    A full discussion of the nature of anticoagulants has been carried out.  A benefit/risk analysis has been presented to the patient, so that they understand the justification for choosing anticoagulation with Xarelto at this time.  The need for compliance is stressed.  Pt is aware to take the medication once daily with the largest meal of the day.  Side effects of potential bleeding are discussed, including unusual colored urine or stools, coughing up blood or coffee ground emesis, nose bleeds or serious fall or head trauma.  Discussed signs and symptoms of stroke. The patient should avoid any OTC items containing aspirin or ibuprofen.  Avoid alcohol consumption.   Call if any signs of abnormal bleeding.  Discussed financial obligations and resolved any difficulty in obtaining medication.   Description   Please start Xarelto 20mg  tonight with largest meal of the day and continue taking everyday at the same time.  Recheck labs in 1 month.

## 2018-06-25 LAB — CBC
HEMOGLOBIN: 13.1 g/dL (ref 13.0–17.7)
Hematocrit: 39.2 % (ref 37.5–51.0)
MCH: 29.8 pg (ref 26.6–33.0)
MCHC: 33.4 g/dL (ref 31.5–35.7)
MCV: 89 fL (ref 79–97)
Platelets: 235 10*3/uL (ref 150–450)
RBC: 4.39 x10E6/uL (ref 4.14–5.80)
RDW: 13.9 % (ref 12.3–15.4)
WBC: 10.1 10*3/uL (ref 3.4–10.8)

## 2018-06-25 LAB — BASIC METABOLIC PANEL
BUN/Creatinine Ratio: 17 (ref 10–24)
BUN: 19 mg/dL (ref 8–27)
CALCIUM: 9.2 mg/dL (ref 8.6–10.2)
CO2: 26 mmol/L (ref 20–29)
CREATININE: 1.13 mg/dL (ref 0.76–1.27)
Chloride: 107 mmol/L — ABNORMAL HIGH (ref 96–106)
GFR calc non Af Amer: 63 mL/min/{1.73_m2} (ref >59)
GFR, EST AFRICAN AMERICAN: 73 mL/min/{1.73_m2} (ref >59)
Glucose: 89 mg/dL (ref 65–99)
POTASSIUM: 4.2 mmol/L (ref 3.5–5.2)
Sodium: 146 mmol/L — ABNORMAL HIGH (ref 134–144)

## 2018-06-25 NOTE — Progress Notes (Signed)
.  xarel

## 2018-06-27 LAB — CUP PACEART INCLINIC DEVICE CHECK
Implantable Pulse Generator Implant Date: 20180926
MDC IDC SESS DTM: 20190731190311

## 2018-06-27 LAB — CUP PACEART REMOTE DEVICE CHECK
Implantable Pulse Generator Implant Date: 20180926
MDC IDC SESS DTM: 20190709024003

## 2018-06-29 ENCOUNTER — Ambulatory Visit (INDEPENDENT_AMBULATORY_CARE_PROVIDER_SITE_OTHER): Payer: Medicare HMO | Admitting: *Deleted

## 2018-06-29 DIAGNOSIS — I639 Cerebral infarction, unspecified: Secondary | ICD-10-CM

## 2018-06-29 NOTE — Progress Notes (Signed)
Carelink Summary Report / Loop Recorder 

## 2018-07-15 DIAGNOSIS — R7301 Impaired fasting glucose: Secondary | ICD-10-CM | POA: Diagnosis not present

## 2018-07-15 DIAGNOSIS — I2510 Atherosclerotic heart disease of native coronary artery without angina pectoris: Secondary | ICD-10-CM | POA: Diagnosis not present

## 2018-07-15 DIAGNOSIS — Z1339 Encounter for screening examination for other mental health and behavioral disorders: Secondary | ICD-10-CM | POA: Diagnosis not present

## 2018-07-15 DIAGNOSIS — N183 Chronic kidney disease, stage 3 (moderate): Secondary | ICD-10-CM | POA: Diagnosis not present

## 2018-07-15 DIAGNOSIS — E785 Hyperlipidemia, unspecified: Secondary | ICD-10-CM | POA: Diagnosis not present

## 2018-07-15 DIAGNOSIS — I639 Cerebral infarction, unspecified: Secondary | ICD-10-CM | POA: Diagnosis not present

## 2018-07-15 DIAGNOSIS — I480 Paroxysmal atrial fibrillation: Secondary | ICD-10-CM | POA: Diagnosis not present

## 2018-07-15 DIAGNOSIS — I129 Hypertensive chronic kidney disease with stage 1 through stage 4 chronic kidney disease, or unspecified chronic kidney disease: Secondary | ICD-10-CM | POA: Diagnosis not present

## 2018-07-23 ENCOUNTER — Ambulatory Visit (INDEPENDENT_AMBULATORY_CARE_PROVIDER_SITE_OTHER): Payer: Medicare HMO | Admitting: *Deleted

## 2018-07-23 DIAGNOSIS — I639 Cerebral infarction, unspecified: Secondary | ICD-10-CM | POA: Diagnosis not present

## 2018-07-23 DIAGNOSIS — Z5181 Encounter for therapeutic drug level monitoring: Secondary | ICD-10-CM

## 2018-07-23 NOTE — Progress Notes (Signed)
Pt was started on Xarelto 20mg  for Afib on 06/24/18 by Dr. Lovena Le.    Reviewed patients medication list.  Pt is not currently on any combined P-gp and strong CYP3A4 inhibitors/inducers (ketoconazole, traconazole, ritonavir, carbamazepine, phenytoin, rifampin, St. John's wort).  Reviewed labs: SCr-1.20, Hgb-13.4, HCT-39.6, CrCl-68.57ml/min, and Weight-92.3kg.  Dose appropriate based on CrCl.   Hgb and HCT within normal limits.  A full discussion of the nature of anticoagulants has been carried out.  A benefit/risk analysis has been presented to the patient, so that they understand the justification for choosing anticoagulation with Xarelto at this time.  The need for compliance is stressed.  Pt is aware to take the medication once daily with the largest meal of the day.  Side effects of potential bleeding are discussed, including unusual colored urine or stools, coughing up blood or coffee ground emesis, nose bleeds or serious fall or head trauma.  Discussed signs and symptoms of stroke. The patient should avoid any OTC items containing aspirin or ibuprofen.  Avoid alcohol consumption.   Call if any signs of abnormal bleeding.  Discussed financial obligations and resolved any difficulty in obtaining medication.    07/24/18-labs resulted and reviewed, called pt and had to leave a message for pt to callback regarding labs.

## 2018-07-24 LAB — BASIC METABOLIC PANEL
BUN/Creatinine Ratio: 15 (ref 10–24)
BUN: 18 mg/dL (ref 8–27)
CALCIUM: 9.4 mg/dL (ref 8.6–10.2)
CHLORIDE: 106 mmol/L (ref 96–106)
CO2: 24 mmol/L (ref 20–29)
CREATININE: 1.2 mg/dL (ref 0.76–1.27)
GFR calc Af Amer: 67 mL/min/{1.73_m2} (ref >59)
GFR calc non Af Amer: 58 mL/min/{1.73_m2} — ABNORMAL LOW (ref >59)
GLUCOSE: 89 mg/dL (ref 65–99)
POTASSIUM: 4.2 mmol/L (ref 3.5–5.2)
Sodium: 146 mmol/L — ABNORMAL HIGH (ref 134–144)

## 2018-07-24 LAB — CBC
HEMOGLOBIN: 13.4 g/dL (ref 13.0–17.7)
Hematocrit: 39.6 % (ref 37.5–51.0)
MCH: 29.9 pg (ref 26.6–33.0)
MCHC: 33.8 g/dL (ref 31.5–35.7)
MCV: 88 fL (ref 79–97)
Platelets: 213 10*3/uL (ref 150–450)
RBC: 4.48 x10E6/uL (ref 4.14–5.80)
RDW: 13.6 % (ref 12.3–15.4)
WBC: 8.7 10*3/uL (ref 3.4–10.8)

## 2018-07-31 ENCOUNTER — Ambulatory Visit (INDEPENDENT_AMBULATORY_CARE_PROVIDER_SITE_OTHER): Payer: Medicare HMO | Admitting: *Deleted

## 2018-07-31 DIAGNOSIS — I639 Cerebral infarction, unspecified: Secondary | ICD-10-CM | POA: Diagnosis not present

## 2018-07-31 NOTE — Progress Notes (Signed)
Carelink Summary Report / Loop Recorder 

## 2018-08-05 LAB — CUP PACEART REMOTE DEVICE CHECK
Date Time Interrogation Session: 20190811064129
MDC IDC PG IMPLANT DT: 20180926

## 2018-08-15 LAB — CUP PACEART REMOTE DEVICE CHECK
Implantable Pulse Generator Implant Date: 20180926
MDC IDC SESS DTM: 20190913104234

## 2018-08-24 ENCOUNTER — Telehealth: Payer: Self-pay | Admitting: Cardiology

## 2018-08-24 MED ORDER — LISINOPRIL 20 MG PO TABS: 20.0000 mg | ORAL_TABLET | Freq: Every day | ORAL | 3 refills | Status: DC

## 2018-08-24 NOTE — Telephone Encounter (Signed)
Pt aware to d/c Losartan and start Lisinopril 20 mg a day.  RX sent into [facility] in [place] as requested.

## 2018-08-24 NOTE — Telephone Encounter (Signed)
Stop losartan 100 mg.  Start lisinopril 20 mg once a day. Candee Furbish, MD

## 2018-08-24 NOTE — Telephone Encounter (Signed)
Will forward to Dr Skains for review and orders 

## 2018-08-24 NOTE — Telephone Encounter (Signed)
New Message   Pt c/o medication issue:  1. Name of Medication: losartan (COZAAR) 100 MG tablet  2. How are you currently taking this medication (dosage and times per day)?   3. Are you having a reaction (difficulty breathing--STAT)?   4. What is your medication issue? Patient states that [place] advised him that this medication has cancer causing ingredients. He wants to know what else the provider will recommend.

## 2018-08-25 ENCOUNTER — Other Ambulatory Visit: Payer: Self-pay | Admitting: Cardiology

## 2018-09-02 ENCOUNTER — Ambulatory Visit (INDEPENDENT_AMBULATORY_CARE_PROVIDER_SITE_OTHER): Payer: Medicare HMO | Admitting: *Deleted

## 2018-09-02 DIAGNOSIS — I639 Cerebral infarction, unspecified: Secondary | ICD-10-CM

## 2018-09-03 NOTE — Progress Notes (Signed)
Carelink Summary Report / Loop Recorder 

## 2018-09-14 LAB — CUP PACEART REMOTE DEVICE CHECK
Date Time Interrogation Session: 20191016103821
MDC IDC PG IMPLANT DT: 20180926

## 2018-10-05 ENCOUNTER — Ambulatory Visit (INDEPENDENT_AMBULATORY_CARE_PROVIDER_SITE_OTHER): Payer: Medicare HMO

## 2018-10-05 DIAGNOSIS — I639 Cerebral infarction, unspecified: Secondary | ICD-10-CM

## 2018-10-05 NOTE — Progress Notes (Signed)
Carelink Summary Report / Loop Recorder 

## 2018-10-18 IMAGING — CR DG CHEST 2V
2 series · 2 of 2 positions shown · non-contrast
Comparison: Radiographs 06/01/2012

CLINICAL DATA: CVA.  Slurred speech.

EXAM:
CHEST  2 VIEW

[chest pa]
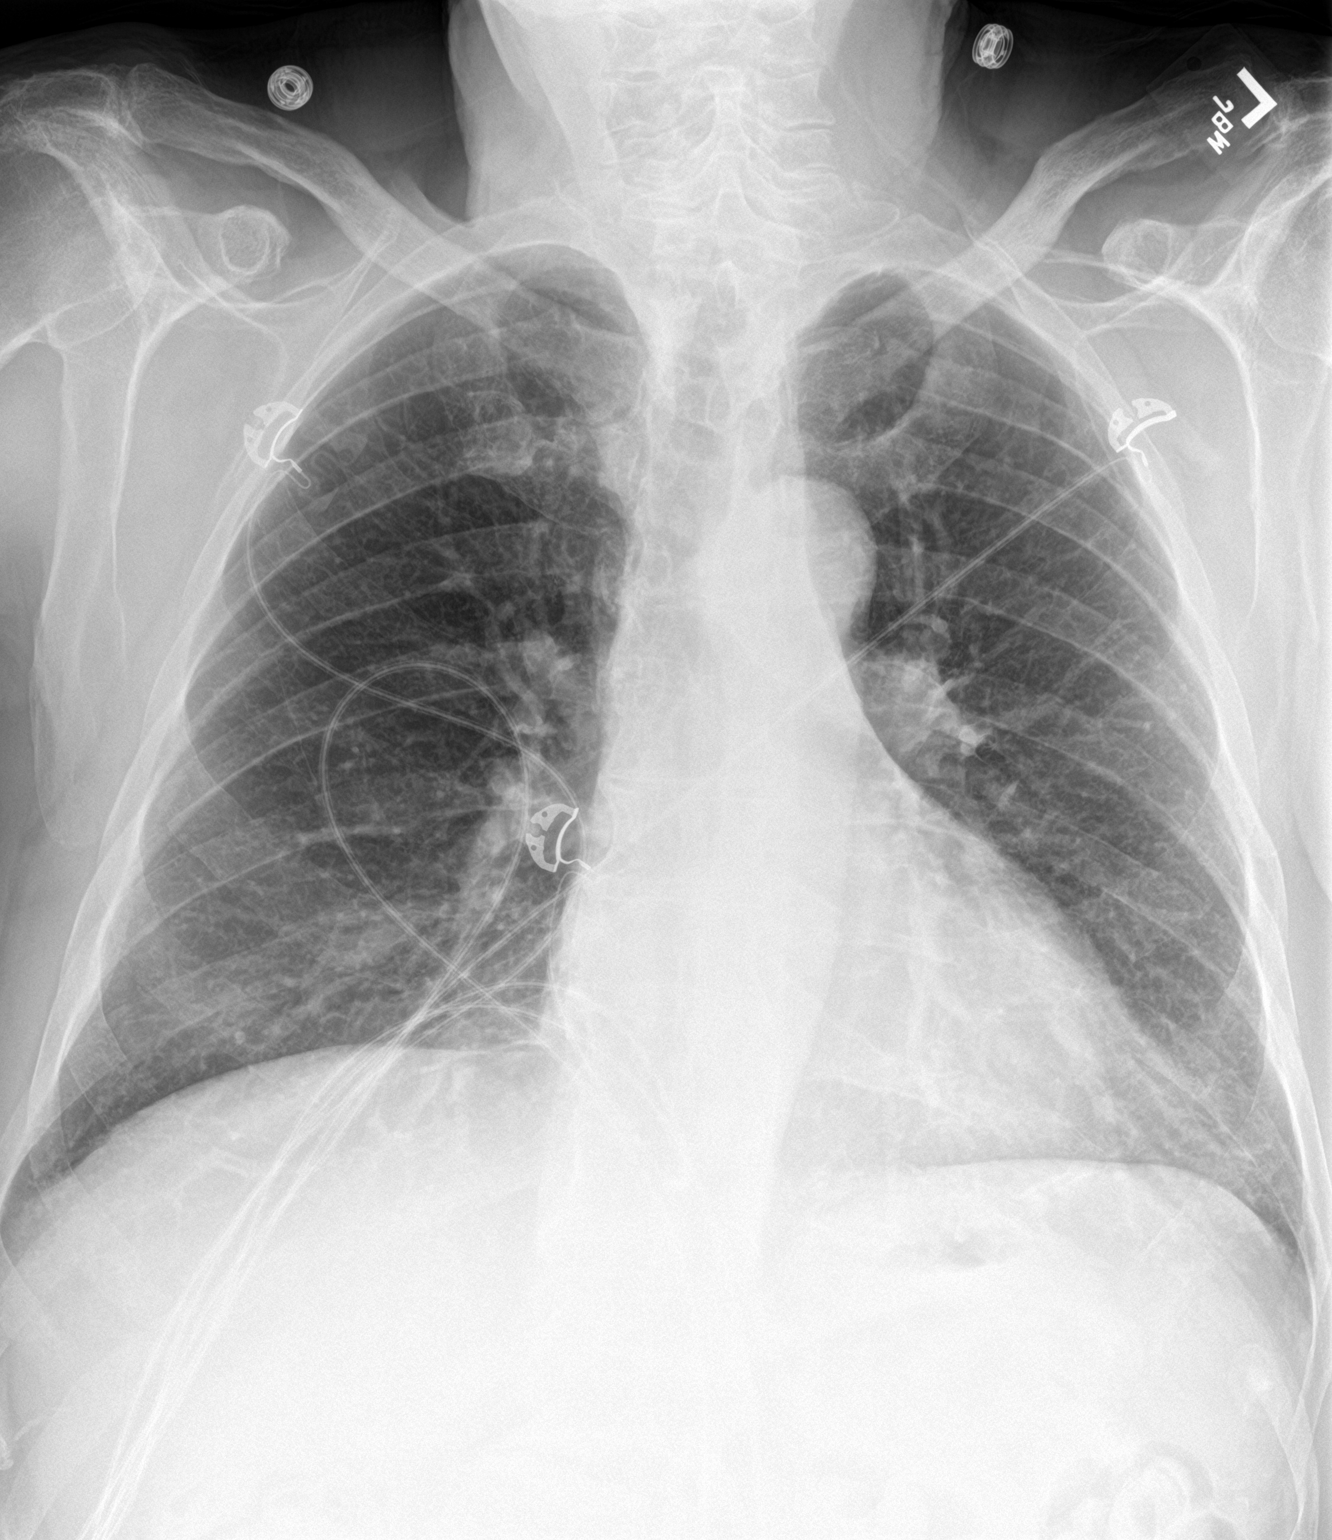

[chest lat]
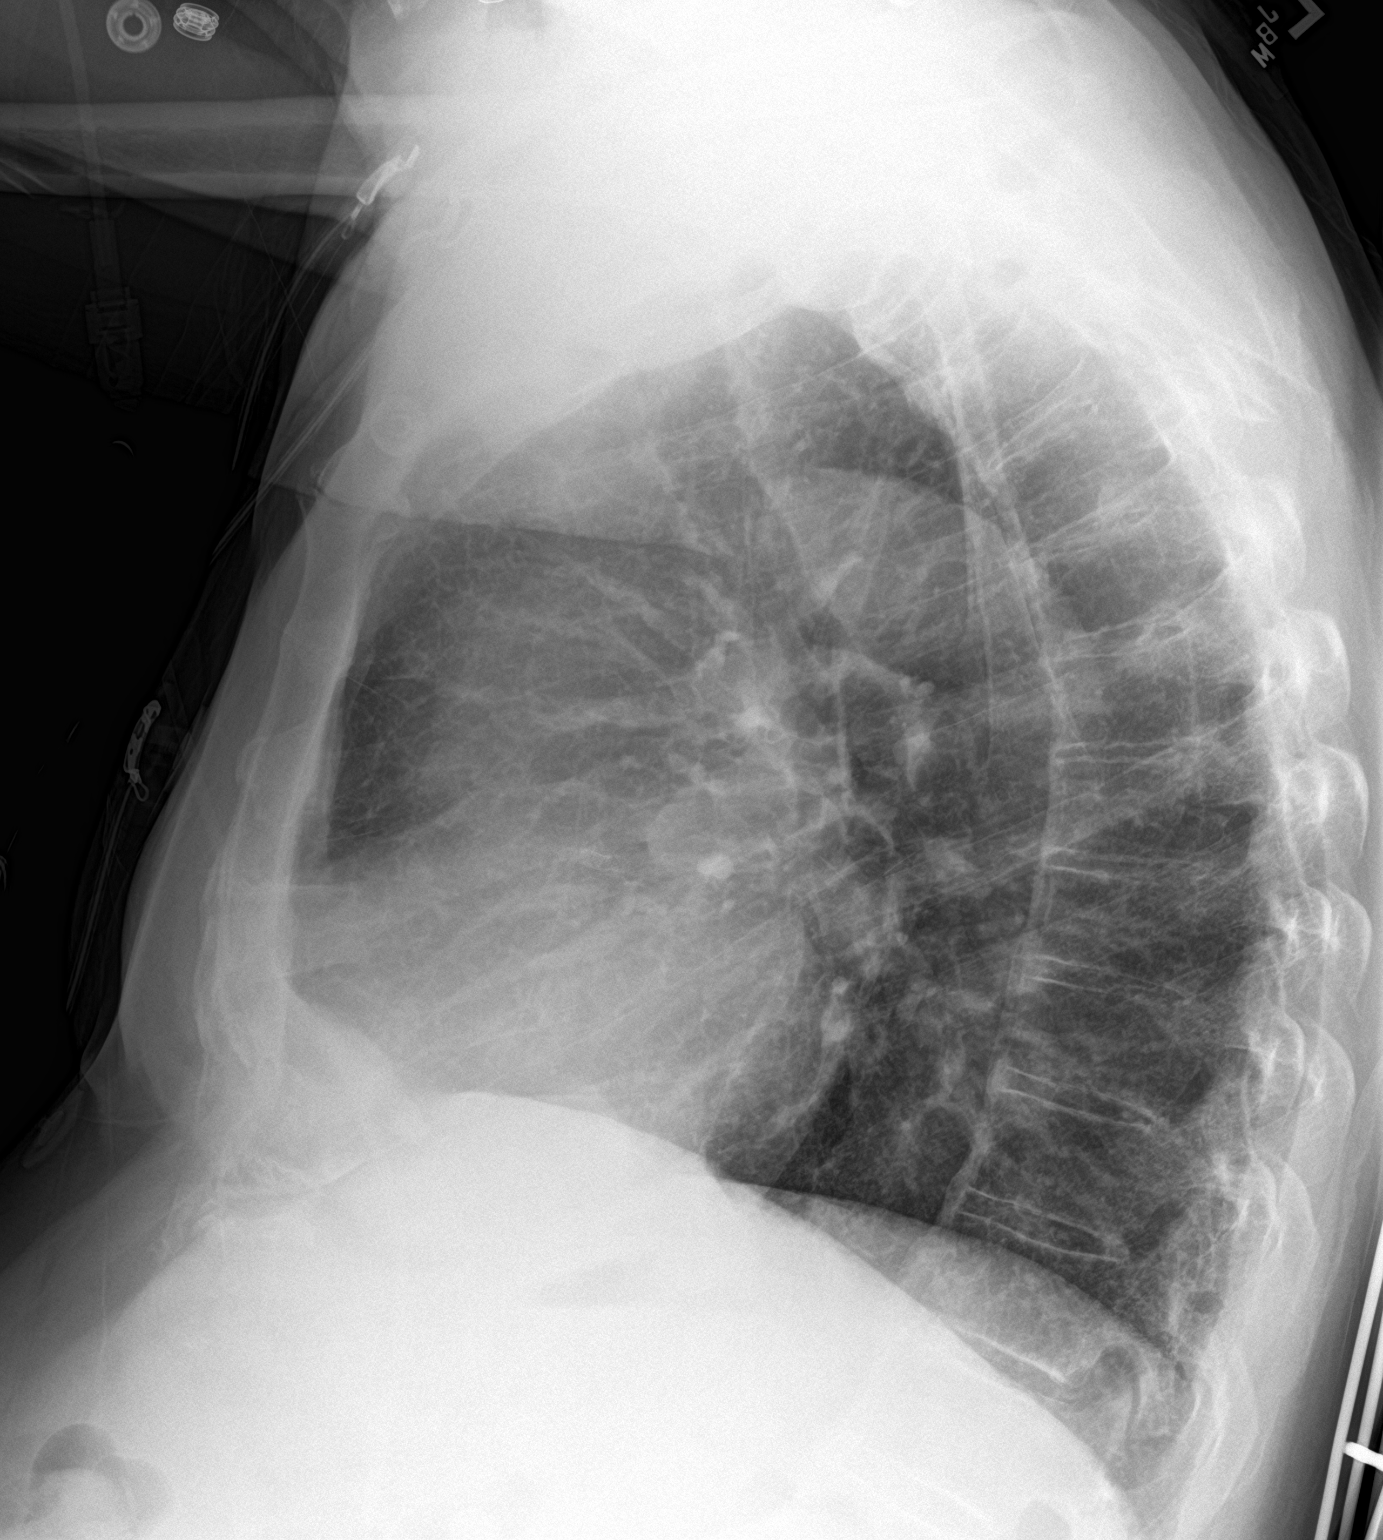

[2 of 2 positions shown; findings below may reference images not displayed]

FINDINGS: The cardiomediastinal contours are normal. Borderline cardiomegaly.
Pulmonary vasculature is normal. No consolidation, pleural effusion,
or pneumothorax. No acute osseous abnormalities are seen.
IMPRESSION: No acute abnormality.  Borderline cardiomegaly.

## 2018-10-18 IMAGING — CT CT HEAD CODE STROKE
4 series · 15 of 47 positions shown, 17 images · non-contrast
Comparison: None.

CLINICAL DATA: Code stroke. Slurred speech. Ataxia. Suspect stroke.
History of hyperlipidemia .

EXAM:
CT HEAD WITHOUT CONTRAST
TECHNIQUE: Contiguous axial images were obtained from the base of the skull
through the vertex without intravenous contrast.

[Series 3: head 5.0 st · axial · 0.46mm/px · z∈[-119,-4]mm · 6 of 33 slices shown, 8 images]
[im 5/33  brain]
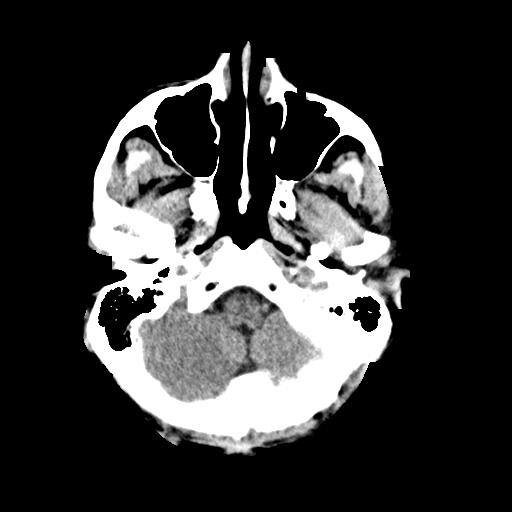
[im 5/33  bone]
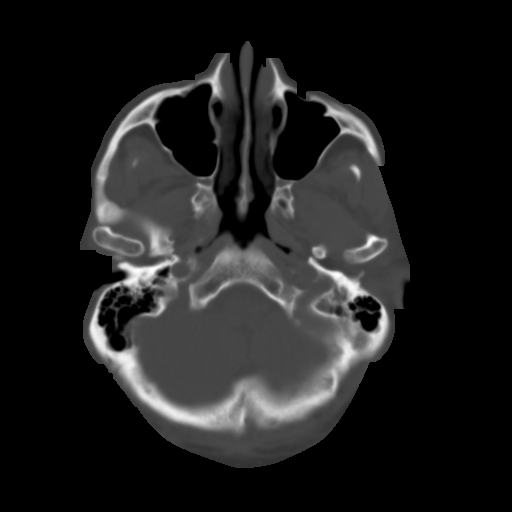
[im 10/33  brain]
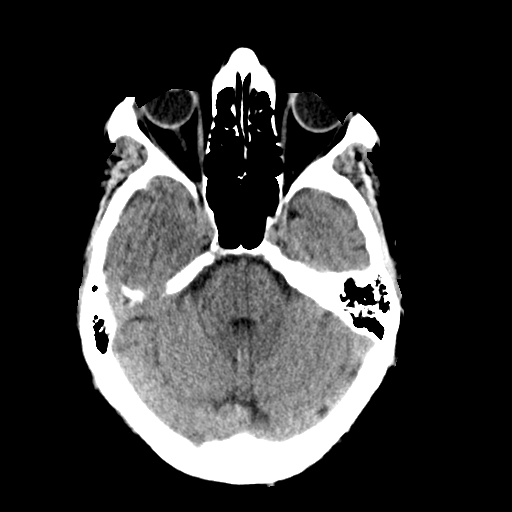
[im 14/33  brain]
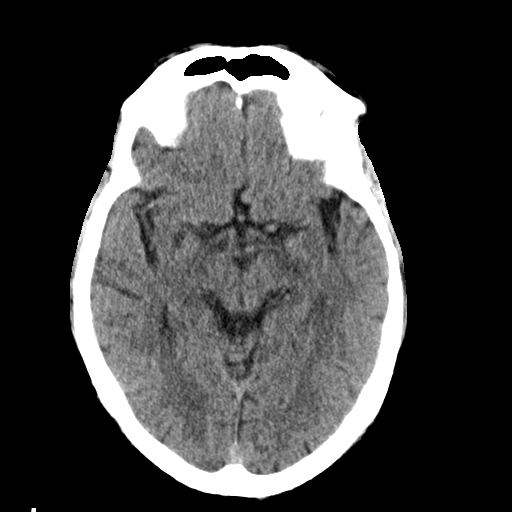
[im 19/33  brain]
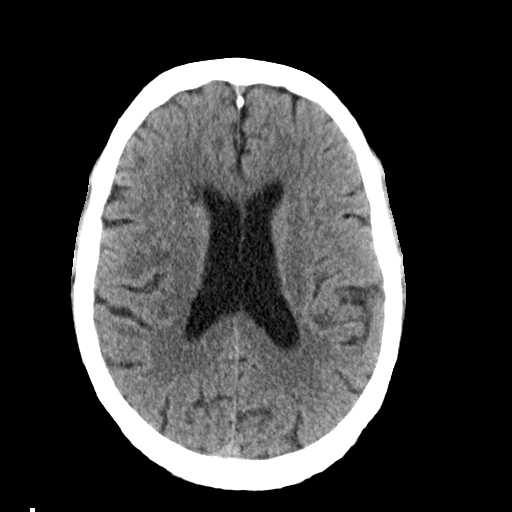
[im 23/33  brain]
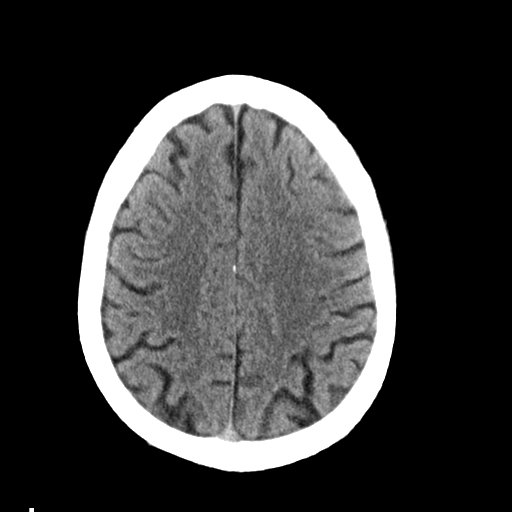
[im 23/33  bone]
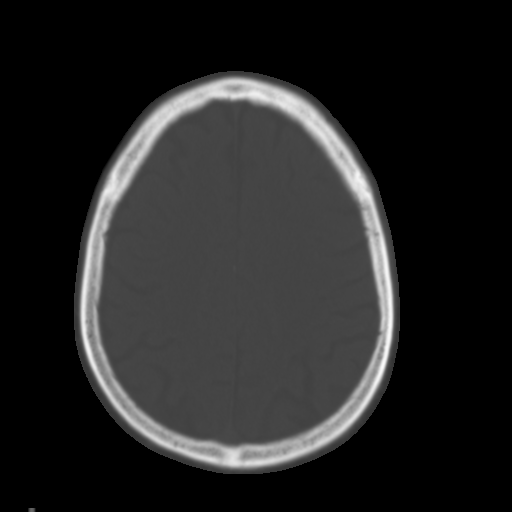
[im 28/33  brain]
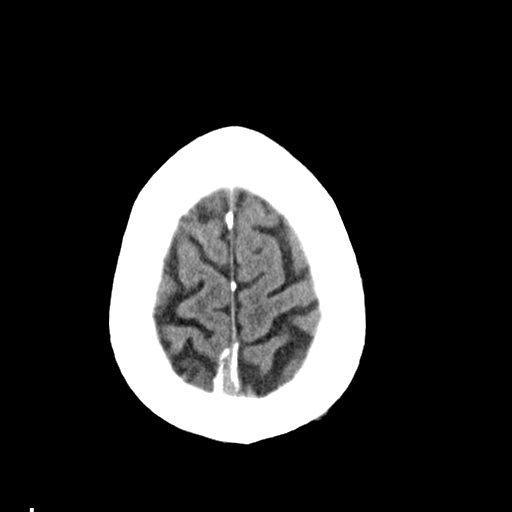

[Series 4: head 2.0 bone · axial · 0.46mm/px · z∈[-125,-85]mm · 3 of 84 slices shown]
[im 8/84  bone]
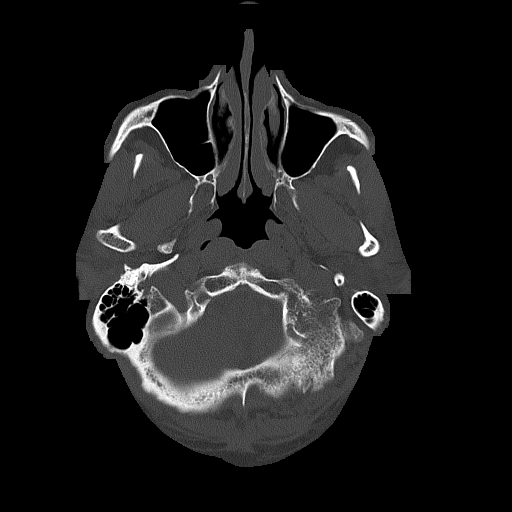
[im 16/84  bone]
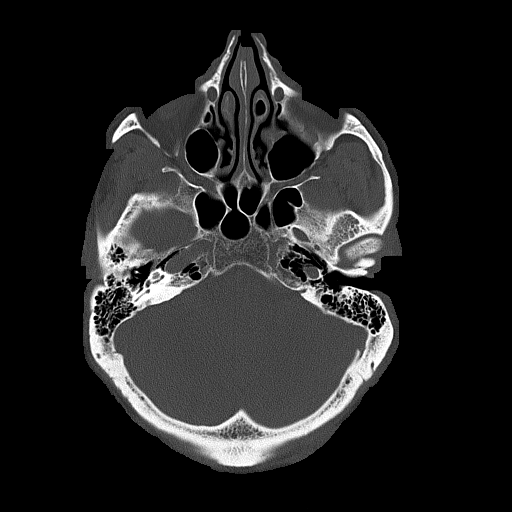
[im 28/84  bone]
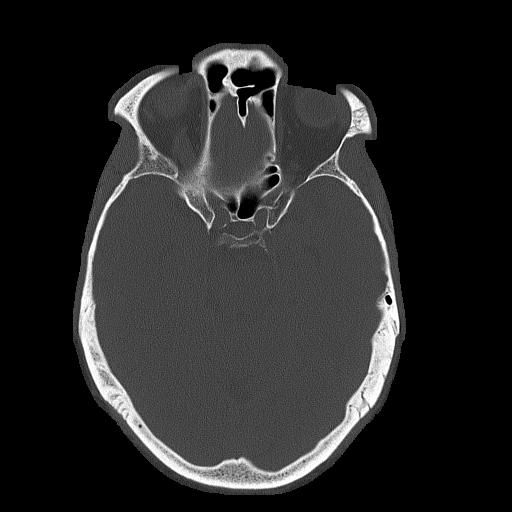

[Series 5: head 3.0 cor st · coronal · 0.33mm/px · 3 of 71 slices shown]
[im 24/71  brain]
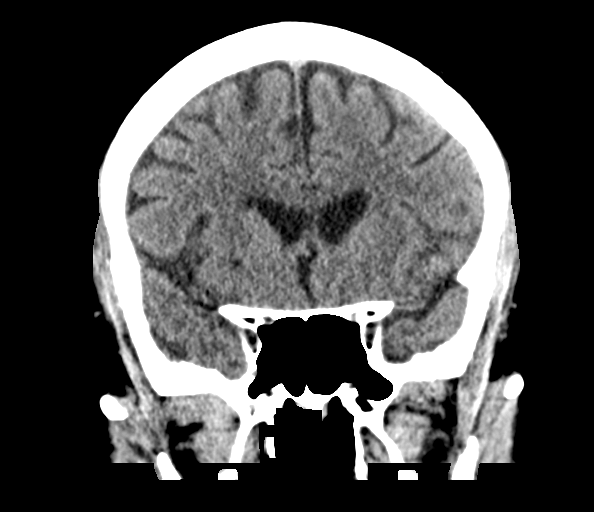
[im 32/71  brain]
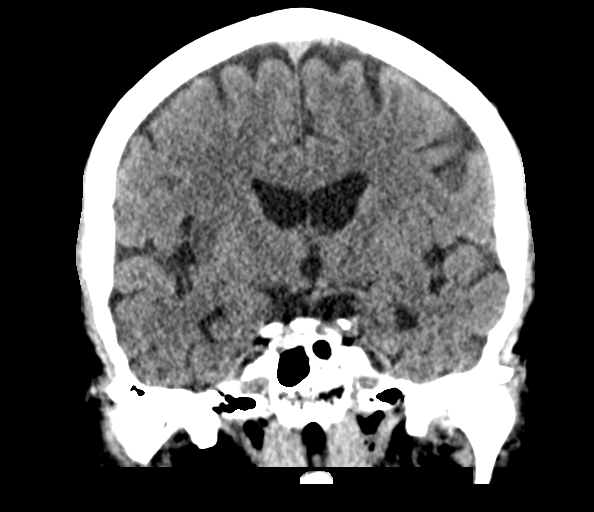
[im 39/71  brain]
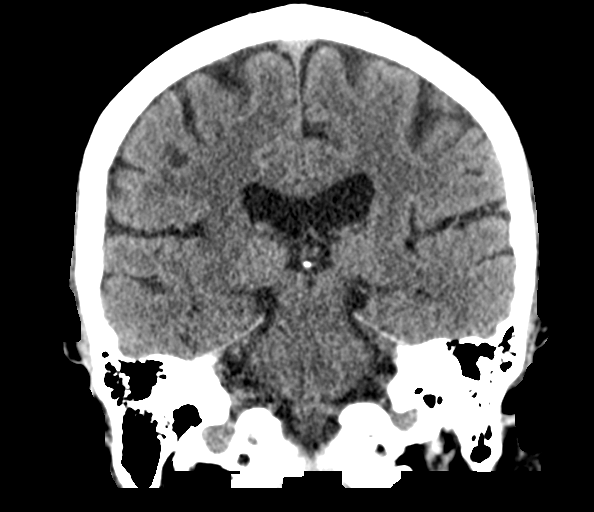

[Series 6: head 3.0 sag st · sagittal · 0.33mm/px · 3 of 65 slices shown]
[im 22/65  brain]
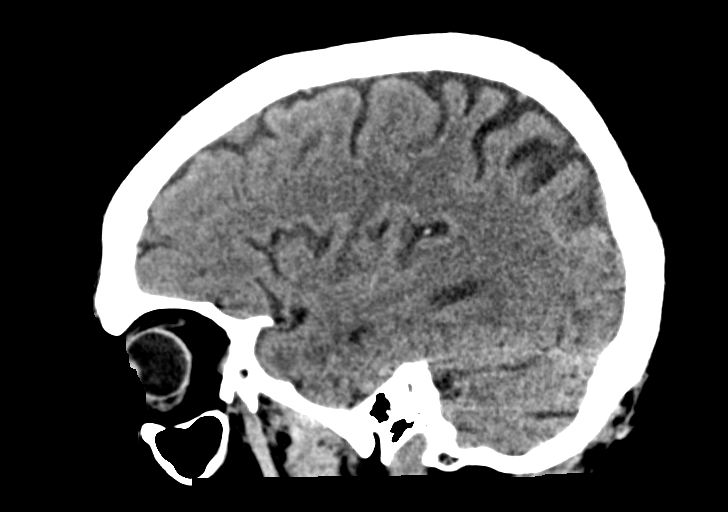
[im 33/65  brain]
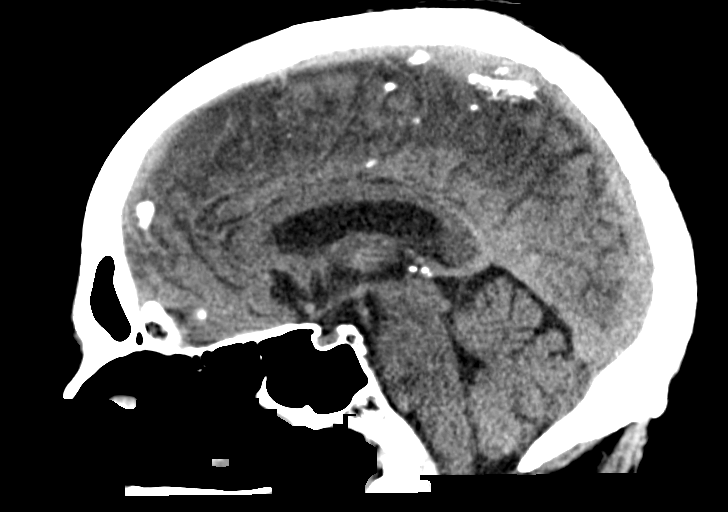
[im 43/65  brain]
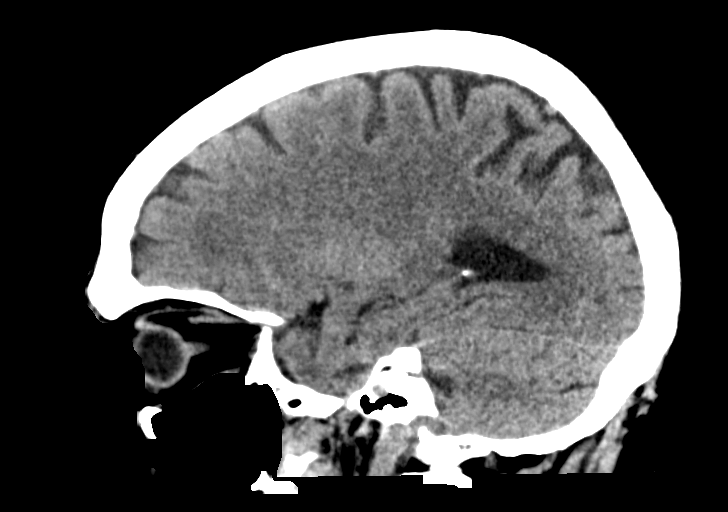

[15 of 47 positions shown; findings below may reference images not displayed]

FINDINGS: BRAIN: No intraparenchymal hemorrhage, mass effect nor midline
shift. The ventricles and sulci are normal for age. Patchy
supratentorial white matter hypodensities within normal range for
patient's age, though non-specific are most compatible with chronic
small vessel ischemic disease. Focal RIGHT insular blurring of
gray-white matter junction. Cystic LEFT thalamus lacunar infarct.
RIGHT basal ganglia lacunar perivascular space, less likely lacunar
infarct. No acute large vascular territory infarcts. No abnormal
extra-axial fluid collections. Basal cisterns are patent.

VASCULAR: Moderate calcific atherosclerosis of the carotid siphons.
No dense MCA.

SKULL: No skull fracture. Small LEFT parietal scalp hematoma without
subcutaneous gas radiopaque foreign bodies.

SINUSES/ORBITS: The mastoid air-cells and included paranasal sinuses
are well-aerated.The included ocular globes and orbital contents are
non-suspicious.

OTHER: None.

ASPECTS (Alberta Stroke Program Early CT Score)

- Ganglionic level infarction (caudate, lentiform nuclei, internal
capsule, insula, M1-M3 cortex): 6

- Supraganglionic infarction (M4-M6 cortex): 3

Total score (0-10 with 10 being normal): 9
IMPRESSION: 1. RIGHT insular age-indeterminate infarct. RIGHT basal ganglia
perivascular space versus lacunar infarct.
2. ASPECTS is 9
3. Mild chronic small vessel ischemic disease and old LEFT thalamus
lacunar infarct.
4. Critical Value/emergent results were called by telephone at the
time of interpretation on 08/11/2017 at [DATE] to Dr. DON LOLITO,
Neurology , who verbally acknowledged these results.

## 2018-10-20 ENCOUNTER — Telehealth: Payer: Self-pay | Admitting: Cardiology

## 2018-10-20 NOTE — Telephone Encounter (Signed)
Spoke w/ pt and instructed him to send a manual transmission w/ his home monitor.

## 2018-10-21 NOTE — Telephone Encounter (Signed)
Manual transmission received and reviewed. All available "pause" episodes are false--ECGs show undersensing.

## 2018-11-09 ENCOUNTER — Ambulatory Visit (INDEPENDENT_AMBULATORY_CARE_PROVIDER_SITE_OTHER): Payer: Medicare HMO

## 2018-11-09 DIAGNOSIS — I639 Cerebral infarction, unspecified: Secondary | ICD-10-CM | POA: Diagnosis not present

## 2018-11-09 LAB — CUP PACEART REMOTE DEVICE CHECK
MDC IDC PG IMPLANT DT: 20180926
MDC IDC SESS DTM: 20191221134228

## 2018-11-09 NOTE — Progress Notes (Signed)
Carelink Summary Report / Loop Recorder 

## 2018-11-22 LAB — CUP PACEART REMOTE DEVICE CHECK
Date Time Interrogation Session: 20191118124206
Implantable Pulse Generator Implant Date: 20180926

## 2018-11-23 ENCOUNTER — Telehealth: Payer: Self-pay

## 2018-11-23 NOTE — Telephone Encounter (Signed)
I left a message for the patient to send a manual

## 2018-11-24 NOTE — Telephone Encounter (Signed)
Manual Carelink transmission received and reviewed. "Pause" episodes are false--undersensing.

## 2018-12-10 ENCOUNTER — Ambulatory Visit (INDEPENDENT_AMBULATORY_CARE_PROVIDER_SITE_OTHER): Payer: PPO

## 2018-12-10 DIAGNOSIS — I639 Cerebral infarction, unspecified: Secondary | ICD-10-CM | POA: Diagnosis not present

## 2018-12-11 NOTE — Progress Notes (Signed)
Carelink Summary Report / Loop Recorder 

## 2018-12-13 LAB — CUP PACEART REMOTE DEVICE CHECK
Date Time Interrogation Session: 20200123144043
MDC IDC PG IMPLANT DT: 20180926

## 2018-12-14 DIAGNOSIS — C61 Malignant neoplasm of prostate: Secondary | ICD-10-CM | POA: Diagnosis not present

## 2018-12-14 DIAGNOSIS — R351 Nocturia: Secondary | ICD-10-CM | POA: Diagnosis not present

## 2019-01-12 ENCOUNTER — Ambulatory Visit (INDEPENDENT_AMBULATORY_CARE_PROVIDER_SITE_OTHER): Payer: PPO | Admitting: *Deleted

## 2019-01-12 DIAGNOSIS — I639 Cerebral infarction, unspecified: Secondary | ICD-10-CM

## 2019-01-12 LAB — CUP PACEART REMOTE DEVICE CHECK
Date Time Interrogation Session: 20200225150832
MDC IDC PG IMPLANT DT: 20180926

## 2019-01-15 DIAGNOSIS — I2510 Atherosclerotic heart disease of native coronary artery without angina pectoris: Secondary | ICD-10-CM | POA: Diagnosis not present

## 2019-01-15 DIAGNOSIS — Z6829 Body mass index (BMI) 29.0-29.9, adult: Secondary | ICD-10-CM | POA: Diagnosis not present

## 2019-01-15 DIAGNOSIS — I639 Cerebral infarction, unspecified: Secondary | ICD-10-CM | POA: Diagnosis not present

## 2019-01-15 DIAGNOSIS — E785 Hyperlipidemia, unspecified: Secondary | ICD-10-CM | POA: Diagnosis not present

## 2019-01-15 DIAGNOSIS — I480 Paroxysmal atrial fibrillation: Secondary | ICD-10-CM | POA: Diagnosis not present

## 2019-01-15 DIAGNOSIS — R7301 Impaired fasting glucose: Secondary | ICD-10-CM | POA: Diagnosis not present

## 2019-01-15 DIAGNOSIS — I129 Hypertensive chronic kidney disease with stage 1 through stage 4 chronic kidney disease, or unspecified chronic kidney disease: Secondary | ICD-10-CM | POA: Diagnosis not present

## 2019-01-15 DIAGNOSIS — N183 Chronic kidney disease, stage 3 (moderate): Secondary | ICD-10-CM | POA: Diagnosis not present

## 2019-01-18 ENCOUNTER — Encounter: Payer: Self-pay | Admitting: Internal Medicine

## 2019-01-18 NOTE — Progress Notes (Unsigned)
LINQ alert for AF burden > threshold. Pt with AF episode 01/17/19, 14 hours in duration. V rates elevated. Presenting rhythm SR. Pt on Xarelto per chart. Will route to Dr Lovena Le for review.

## 2019-01-20 NOTE — Progress Notes (Signed)
Carelink Summary Report / Loop Recorder 

## 2019-02-15 ENCOUNTER — Other Ambulatory Visit: Payer: Self-pay

## 2019-02-15 ENCOUNTER — Ambulatory Visit (INDEPENDENT_AMBULATORY_CARE_PROVIDER_SITE_OTHER): Payer: PPO | Admitting: *Deleted

## 2019-02-15 DIAGNOSIS — R55 Syncope and collapse: Secondary | ICD-10-CM

## 2019-02-15 DIAGNOSIS — I639 Cerebral infarction, unspecified: Secondary | ICD-10-CM

## 2019-02-15 LAB — CUP PACEART REMOTE DEVICE CHECK
Date Time Interrogation Session: 20200329164204
Implantable Pulse Generator Implant Date: 20180926

## 2019-02-16 ENCOUNTER — Other Ambulatory Visit: Payer: Self-pay | Admitting: *Deleted

## 2019-02-16 ENCOUNTER — Other Ambulatory Visit: Payer: PPO | Admitting: *Deleted

## 2019-02-16 ENCOUNTER — Other Ambulatory Visit: Payer: Self-pay

## 2019-02-16 ENCOUNTER — Encounter: Payer: Self-pay | Admitting: *Deleted

## 2019-02-16 NOTE — Patient Outreach (Signed)
HTA HRA follow up outreach: Called and wife answered phone stating that Mr. Godinho would not be home until 4:00. I will call back at that time today.  Eulah Pont. Myrtie Neither, MSN, GNP-BC Gerontological Nurse Practitioner [facility] Care Management [phone]  Spoke with Mr. Fanelli for screening. He could not tell me if he had any chronic illnesses. I asked about his medications and he told me he takes what his doctor tells him to. I asked if I could refer him to a [facility] to provide him with additional education and he turned down this opportunity.  I will send him a successful letter for his future reference.  Eulah Pont. Myrtie Neither, MSN, [facility] Gerontological Nurse Practitioner [facility] Care Management [phone]

## 2019-02-24 NOTE — Progress Notes (Signed)
Carelink Summary Report / Loop Recorder 

## 2019-03-09 DIAGNOSIS — L853 Xerosis cutis: Secondary | ICD-10-CM | POA: Diagnosis not present

## 2019-03-09 DIAGNOSIS — L300 Nummular dermatitis: Secondary | ICD-10-CM | POA: Diagnosis not present

## 2019-03-09 DIAGNOSIS — L570 Actinic keratosis: Secondary | ICD-10-CM | POA: Diagnosis not present

## 2019-03-09 DIAGNOSIS — L299 Pruritus, unspecified: Secondary | ICD-10-CM | POA: Diagnosis not present

## 2019-03-19 ENCOUNTER — Other Ambulatory Visit: Payer: Self-pay

## 2019-03-19 ENCOUNTER — Ambulatory Visit (INDEPENDENT_AMBULATORY_CARE_PROVIDER_SITE_OTHER): Payer: PPO | Admitting: *Deleted

## 2019-03-19 DIAGNOSIS — I639 Cerebral infarction, unspecified: Secondary | ICD-10-CM

## 2019-03-19 LAB — CUP PACEART REMOTE DEVICE CHECK
Date Time Interrogation Session: 20200501145411
Implantable Pulse Generator Implant Date: 20180926

## 2019-03-25 NOTE — Progress Notes (Signed)
Carelink Summary Report / Loop Recorder 

## 2019-04-07 DIAGNOSIS — Z136 Encounter for screening for cardiovascular disorders: Secondary | ICD-10-CM | POA: Diagnosis not present

## 2019-04-07 DIAGNOSIS — Z9181 History of falling: Secondary | ICD-10-CM | POA: Diagnosis not present

## 2019-04-07 DIAGNOSIS — E785 Hyperlipidemia, unspecified: Secondary | ICD-10-CM | POA: Diagnosis not present

## 2019-04-07 DIAGNOSIS — Z0000 Encounter for general adult medical examination without abnormal findings: Secondary | ICD-10-CM | POA: Diagnosis not present

## 2019-04-07 DIAGNOSIS — Z1331 Encounter for screening for depression: Secondary | ICD-10-CM | POA: Diagnosis not present

## 2019-04-07 DIAGNOSIS — Z139 Encounter for screening, unspecified: Secondary | ICD-10-CM | POA: Diagnosis not present

## 2019-04-07 DIAGNOSIS — Z125 Encounter for screening for malignant neoplasm of prostate: Secondary | ICD-10-CM | POA: Diagnosis not present

## 2019-04-07 DIAGNOSIS — Z1211 Encounter for screening for malignant neoplasm of colon: Secondary | ICD-10-CM | POA: Diagnosis not present

## 2019-04-20 ENCOUNTER — Telehealth: Payer: Self-pay | Admitting: Cardiology

## 2019-04-20 NOTE — Telephone Encounter (Signed)
New message ° ° °Patient calling the office for samples of medication: ° ° °1.  What medication and dosage are you requesting samples for?rivaroxaban (XARELTO) 20 MG TABS tablet ° °2.  Are you currently out of this medication? Yes  ° ° °

## 2019-04-21 ENCOUNTER — Ambulatory Visit (INDEPENDENT_AMBULATORY_CARE_PROVIDER_SITE_OTHER): Payer: PPO | Admitting: *Deleted

## 2019-04-21 DIAGNOSIS — I639 Cerebral infarction, unspecified: Secondary | ICD-10-CM | POA: Diagnosis not present

## 2019-04-21 MED ORDER — RIVAROXABAN 20 MG PO TABS: 20.0000 mg | ORAL_TABLET | Freq: Every day | ORAL | 1 refills | Status: DC

## 2019-04-21 NOTE — Telephone Encounter (Signed)
We have not sent in Xarelto refill since last August but was only for a 30 day supply at that time, pt needs new rx sent to pharmacy. Spoke with pt's wife who is aware refill has been sent in.   SCr 1.31, age 78, weight 92.5kg, CrCl 22mL/min.

## 2019-04-22 LAB — CUP PACEART REMOTE DEVICE CHECK
Date Time Interrogation Session: 20200603181204
Implantable Pulse Generator Implant Date: 20180926

## 2019-04-26 ENCOUNTER — Telehealth: Payer: Self-pay

## 2019-04-26 DIAGNOSIS — K5730 Diverticulosis of large intestine without perforation or abscess without bleeding: Secondary | ICD-10-CM | POA: Diagnosis not present

## 2019-04-26 DIAGNOSIS — K644 Residual hemorrhoidal skin tags: Secondary | ICD-10-CM | POA: Diagnosis not present

## 2019-04-26 DIAGNOSIS — Z800 Family history of malignant neoplasm of digestive organs: Secondary | ICD-10-CM | POA: Diagnosis not present

## 2019-04-26 DIAGNOSIS — Z01818 Encounter for other preprocedural examination: Secondary | ICD-10-CM | POA: Diagnosis not present

## 2019-04-26 NOTE — Telephone Encounter (Addendum)
Patient with diagnosis of afib on Xarelto for anticoagulation.    Procedure:  Colonoscopy Date of procedure: 05/25/2019  CHADS2-VASc score of  7 (CHF, HTN, AGE, DM2, stroke/tia x 2, CAD, AGE, male)  CrCl 47ml/min  Due to low risk of bleeding and patients high risk off anticoagulation, Per office protocol, patient can hold Xarelto for 1 day prior to procedure.

## 2019-04-26 NOTE — Telephone Encounter (Signed)
   [facility] Pre-operative Risk Assessment    Request for surgical clearance:  1. What type of surgery is being performed? Colonoscopy   2. When is this surgery scheduled? 05/25/19   3. What type of clearance is required (medical clearance vs. Pharmacy clearance to hold med vs. Both)? Pharmacy  4. Are there any medications that need to be held prior to surgery and how long? Xarelto, stop on 05/23/19   5. Practice name and name of physician performing surgery? Dr. Christia Reading [facility]   6. What is your office phone number [phone]    7.   What is your office fax number [phone]  2.   Anesthesia type (None, local, MAC, general) ? None listed   Samuel [facility]s 04/26/2019, 2:58 PM  _________________________________________________________________   (provider comments below)

## 2019-04-27 ENCOUNTER — Telehealth: Payer: Self-pay

## 2019-04-27 NOTE — Telephone Encounter (Signed)

## 2019-04-28 ENCOUNTER — Other Ambulatory Visit: Payer: Self-pay

## 2019-04-28 ENCOUNTER — Encounter: Payer: Self-pay | Admitting: Cardiology

## 2019-04-28 ENCOUNTER — Ambulatory Visit (INDEPENDENT_AMBULATORY_CARE_PROVIDER_SITE_OTHER): Payer: PPO | Admitting: Cardiology

## 2019-04-28 VITALS — BP 136/70 | HR 43 | Ht 71.0 in | Wt 193.6 lb

## 2019-04-28 DIAGNOSIS — R001 Bradycardia, unspecified: Secondary | ICD-10-CM | POA: Diagnosis not present

## 2019-04-28 DIAGNOSIS — Q2112 Patent foramen ovale: Secondary | ICD-10-CM

## 2019-04-28 DIAGNOSIS — Q211 Atrial septal defect: Secondary | ICD-10-CM | POA: Diagnosis not present

## 2019-04-28 DIAGNOSIS — I4891 Unspecified atrial fibrillation: Secondary | ICD-10-CM | POA: Diagnosis not present

## 2019-04-28 DIAGNOSIS — I639 Cerebral infarction, unspecified: Secondary | ICD-10-CM | POA: Diagnosis not present

## 2019-04-28 NOTE — Patient Instructions (Signed)
Medication Instructions:  No changes If you need a refill on your cardiac medications before your next appointment, please call your pharmacy.   Lab work: none If you have labs (blood work) drawn today and your tests are completely normal, you will receive your results only by: Marland Kitchen MyChart Message (if you have MyChart) OR . A paper copy in the mail If you have any lab test that is abnormal or we need to change your treatment, we will call you to review the results.  Testing/Procedures: none  . Follow-Up: . Your physician wants you to follow-up in: 6 months with Cecilie Kicks, NP.  You will receive a reminder letter in the mail two months in advance. If you don't receive a letter, please call our office to schedule the follow-up appointment. . Your physician wants you to follow-up in: 12 months with Dr. Marlou Porch.   You will receive a reminder letter in the mail two months in advance. If you don't receive a letter, please call our office to schedule the follow-up appointment. .   Any Other Special Instructions Will Be Listed Below (If Applicable).

## 2019-04-28 NOTE — Progress Notes (Signed)
Cardiology Office Note:    Date:  04/28/2019   ID:  Samuel Walls, DOB [birth year], MRN [id number]  PCP:  Nicoletta Dress, MD  Cardiologist:  Candee Furbish, MD  Electrophysiologist:  None   Referring MD: Nicoletta Dress, MD   No chief complaint on file.  Atrial fibrillation follow-up History of Present Illness:    Samuel Walls is a 78 y.o. male with a hx of cryptogenic stroke with implantable loop recorder.  Found to have A. fib.  Currently on Xarelto.  Asymptomatic.  No chest pain no shortness of breath no bleeding.  Awaiting colonoscopy.  He may hold his Xarelto for 2 days prior.  Overall feels great.  Past Medical History:  Diagnosis Date  . BPH (benign prostatic hyperplasia)   . CAD (coronary artery disease) cardiologist-  dr Joellyn Quails   a.  NSTEMI  05-31-2012 s/p  DES to pLAD (troponin >20).  . Chronic sinus bradycardia   . History of non-ST elevation myocardial infarction (NSTEMI)    07/ 2013   s/p  DES to LAD  . Hyperlipidemia LDL goal < 70   . Ischemic cardiomyopathy    a. 05/2012 per cardiac cath EF 35%. b. Subsequent echoes have shown EF improved to 45-50%.  . Nocturia   . PFO (patent foramen ovale)   . Prostate cancer [facility]) urologist-  dr chao/  oncologist-  dr Tammi Klippel   T1c, Gleason 7,  PSA 4.9  . S/P drug eluting coronary stent placement    05-31-2012   DES x1  to pLAD  . Stroke (cerebrum) ([place]) 07/2017  . Wears glasses     Past Surgical History:  Procedure Laterality Date  . ANKLE CLOSED REDUCTION Left 2000  . LEFT HEART CATHETERIZATION WITH CORONARY ANGIOGRAM N/A 05/31/2012   Procedure: LEFT HEART CATHETERIZATION WITH CORONARY ANGIOGRAM;  Surgeon: Sherren Mocha, MD;  Location: [facility] CATH LAB;  Service: Cardiovascular;  Laterality: N/A;  PCI w/ DES x1 to pLAD (95%);  moderate stenoses of RCA and CLx;  moderate severe segmental LV dysfunction, ef 35%  . LOOP RECORDER INSERTION N/A 08/13/2017   Procedure: LOOP RECORDER INSERTION;  Surgeon: Evans Lance, MD;  Location: [facility] CV LAB;  Service: Cardiovascular;  Laterality: N/A;  . PROSTATE BIOPSY    . RADIOACTIVE SEED IMPLANT N/A 01/02/2017   Procedure: RADIOACTIVE SEED IMPLANT/BRACHYTHERAPY IMPLANT;  Surgeon: Joie Bimler, MD;  Location[facility];  Service: Urology;  Laterality: N/A;  . TEE WITHOUT CARDIOVERSION N/A 08/13/2017   Procedure: TRANSESOPHAGEAL ECHOCARDIOGRAM (TEE);  Surgeon: Pixie Casino, MD;  Location: [facility] ENDOSCOPY;  Service: Cardiovascular;  Laterality: N/A;  . TRANSTHORACIC ECHOCARDIOGRAM  11/29/2013   midl focal basal LVH of the septum,  EF 50%, akinesis of the mid-distalanteroseptal and apical myocardium,  grade 1 diastolic dysfunction/  mild LAE/  mild dilated aortic root/  trivial MR and TR    Current Medications: Current Meds  Medication Sig  . amLODipine (NORVASC) 2.5 MG tablet Take 1 tablet (2.5 mg total) by mouth daily.  Marland Kitchen atorvastatin (LIPITOR) 40 MG tablet Take 1 tablet (40 mg total) by mouth daily.  Marland Kitchen lisinopril (PRINIVIL,ZESTRIL) 20 MG tablet Take 1 tablet (20 mg total) by mouth daily.  . Multiple Vitamin (MULTIVITAMIN WITH MINERALS) TABS Take 1 tablet by mouth every morning.   . nitroGLYCERIN (NITROSTAT) 0.4 MG SL tablet DISSOLVE ONE TABLET UNDER THE TONGUE EVERY 5 MINUTES AS NEEDED FOR CHEST PAIN.  DO NOT EXCEED A TOTAL OF 3  DOSES IN 15 MINUTES  . omega-3 acid ethyl esters (LOVAZA) 1 G capsule Take 2 g by mouth every evening.   . rivaroxaban (XARELTO) 20 MG TABS tablet Take 1 tablet (20 mg total) by mouth daily with supper.  . tadalafil (CIALIS) 20 MG tablet Take 20 mg by mouth daily as needed for erectile dysfunction.     Allergies:   Hydrocodone   Social History   Socioeconomic History  . Marital status: Married    Spouse name: Not on file  . Number of children: Not on file  . Years of education: Not on file  . Highest education level: Not on file  Occupational History  . Not on file  Social Needs  . Financial  resource strain: Not on file  . Food insecurity:    Worry: Not on file    Inability: Not on file  . Transportation needs:    Medical: Not on file    Non-medical: Not on file  Tobacco Use  . Smoking status: Never Smoker  . Smokeless tobacco: Never Used  Substance and Sexual Activity  . Alcohol use: No  . Drug use: No  . Sexual activity: Not on file  Lifestyle  . Physical activity:    Days per week: Not on file    Minutes per session: Not on file  . Stress: Not on file  Relationships  . Social connections:    Talks on phone: Not on file    Gets together: Not on file    Attends religious service: Not on file    Active member of club or organization: Not on file    Attends meetings of clubs or organizations: Not on file    Relationship status: Not on file  Other Topics Concern  . Not on file  Social History Narrative  . Not on file     Family History: The patient's family history includes ALS in his mother; Other in his father. There is no history of Cancer.  ROS:   Please see the history of present illness.    Denies any fevers chills nausea vomiting syncope bleeding all other systems reviewed and are negative.  EKGs/Labs/Other Studies Reviewed:    The following studies were reviewed today:  Transesophageal echocardiogram 08/13/2017 - Left ventricle: There was mild concentric hypertrophy. Systolic   function was mildly reduced. The estimated ejection fraction was   in the range of 45% to 50%. Distal anteroapical hypokinesis. - Mitral valve: No evidence of vegetation. - Left atrium: The atrium was dilated. No evidence of thrombus in   the atrial cavity or appendage. - Right atrium: No evidence of thrombus in the atrial cavity or   appendage. - Atrial septum: Aneurysmal. There is left to right flow by color   doppler suggestive of PFO. Right to left flow by saline   microbubble contrast was seen intermittently without and with   provocation. - Pulmonic valve: No  evidence of vegetation.  EKG:  EKG is  ordered today.  The ekg ordered today demonstrates sinus bradycardia 43 no other significant abnormalities.  Recent Labs: 07/23/2018: BUN 18; Creatinine, Ser 1.20; Hemoglobin 13.4; Platelets 213; Potassium 4.2; Sodium 146  Recent Lipid Panel    Component Value Date/Time   CHOL 101 08/12/2017 0233   TRIG 90 08/12/2017 0233   HDL 38 (L) 08/12/2017 0233   CHOLHDL 2.7 08/12/2017 0233   VLDL 18 08/12/2017 0233   LDLCALC 45 08/12/2017 0233    Physical Exam:    VS:  BP 136/70   Pulse (!) 43   Ht 5\' 11"  (1.803 m)   Wt 193 lb 9.6 oz (87.8 kg)   SpO2 95%   BMI 27.00 kg/m     Wt Readings from Last 3 Encounters:  04/28/19 193 lb 9.6 oz (87.8 kg)  06/17/18 204 lb (92.5 kg)  04/27/18 202 lb 12.8 oz (92 kg)     GEN:  Well nourished, well developed in no acute distress HEENT: Normal NECK: No JVD; No carotid bruits LYMPHATICS: No lymphadenopathy CARDIAC: Bradycardic regular, no murmurs, rubs, gallops RESPIRATORY:  Clear to auscultation without rales, wheezing or rhonchi  ABDOMEN: Soft, non-tender, non-distended MUSCULOSKELETAL:  No edema; No deformity  SKIN: Warm and dry NEUROLOGIC:  Alert and oriented x 3 PSYCHIATRIC:  Normal affect   ASSESSMENT:    1. Atrial fibrillation, unspecified type ([place])   2. Bradycardia   3. PFO (patent foramen ovale)   4. Cryptogenic stroke [facility])    PLAN:    In order of problems listed above:  Paroxysmal atrial fibrillation -Caught on implantable loop recorder, currently on Xarelto.  Doing well, no bleeding, previous creatinine 1.3 hemoglobin 14.1.  Coronary artery disease -Status post PCI as above.  No anginal symptoms doing well.  Sinus bradycardia - Asymptomatic heart rate 43 on ECG.  Not on any AV nodal blocking agents.  Hyperlipidemia - Continue with statin therapy.  Excellent.  No myalgias.  Prior stroke - Could have been secondary to atrial fibrillation since it was caught on the implantable  loop recorder.  Currently on anticoagulation, no further neurologic symptoms.  Essential hypertension - Lisinopril.  Stable.   Medication Adjustments/Labs and Tests Ordered: Current medicines are reviewed at length with the patient today.  Concerns regarding medicines are outlined above.  Orders Placed This Encounter  Procedures  . EKG 12-Lead   No orders of the defined types were placed in this encounter.   Patient Instructions  Medication Instructions:  No changes If you need a refill on your cardiac medications before your next appointment, please call your pharmacy.   Lab work: none If you have labs (blood work) drawn today and your tests are completely normal, you will receive your results only by: Marland Kitchen MyChart Message (if you have MyChart) OR . A paper copy in the mail If you have any lab test that is abnormal or we need to change your treatment, we will call you to review the results.  Testing/Procedures: none  . Follow-Up: . Your physician wants you to follow-up in: 6 months with Cecilie Kicks, NP.  You will receive a reminder letter in the mail two months in advance. If you don't receive a letter, please call our office to schedule the follow-up appointment. . Your physician wants you to follow-up in: 12 months with Dr. Marlou Porch.   You will receive a reminder letter in the mail two months in advance. If you don't receive a letter, please call our office to schedule the follow-up appointment. .   Any Other Special Instructions Will Be Listed Below (If Applicable).       Signed, Candee Furbish, MD  04/28/2019 3:22 PM    [facility]

## 2019-04-30 NOTE — Progress Notes (Signed)
Carelink Summary Report / Loop Recorder 

## 2019-05-05 ENCOUNTER — Other Ambulatory Visit: Payer: Self-pay | Admitting: Cardiology

## 2019-05-24 ENCOUNTER — Ambulatory Visit (INDEPENDENT_AMBULATORY_CARE_PROVIDER_SITE_OTHER): Payer: PPO | Admitting: *Deleted

## 2019-05-24 DIAGNOSIS — I639 Cerebral infarction, unspecified: Secondary | ICD-10-CM

## 2019-05-25 DIAGNOSIS — Z7901 Long term (current) use of anticoagulants: Secondary | ICD-10-CM | POA: Diagnosis not present

## 2019-05-25 DIAGNOSIS — Z09 Encounter for follow-up examination after completed treatment for conditions other than malignant neoplasm: Secondary | ICD-10-CM | POA: Diagnosis not present

## 2019-05-25 DIAGNOSIS — Z79899 Other long term (current) drug therapy: Secondary | ICD-10-CM | POA: Diagnosis not present

## 2019-05-25 DIAGNOSIS — Z955 Presence of coronary angioplasty implant and graft: Secondary | ICD-10-CM | POA: Diagnosis not present

## 2019-05-25 DIAGNOSIS — K648 Other hemorrhoids: Secondary | ICD-10-CM | POA: Diagnosis not present

## 2019-05-25 DIAGNOSIS — Z800 Family history of malignant neoplasm of digestive organs: Secondary | ICD-10-CM | POA: Diagnosis not present

## 2019-05-25 DIAGNOSIS — I4891 Unspecified atrial fibrillation: Secondary | ICD-10-CM | POA: Diagnosis not present

## 2019-05-25 DIAGNOSIS — K5730 Diverticulosis of large intestine without perforation or abscess without bleeding: Secondary | ICD-10-CM | POA: Diagnosis not present

## 2019-05-25 DIAGNOSIS — Z86010 Personal history of colonic polyps: Secondary | ICD-10-CM | POA: Diagnosis not present

## 2019-05-25 DIAGNOSIS — Z8673 Personal history of transient ischemic attack (TIA), and cerebral infarction without residual deficits: Secondary | ICD-10-CM | POA: Diagnosis not present

## 2019-05-25 DIAGNOSIS — I10 Essential (primary) hypertension: Secondary | ICD-10-CM | POA: Diagnosis not present

## 2019-05-25 LAB — CUP PACEART REMOTE DEVICE CHECK
Date Time Interrogation Session: 20200706180937
Implantable Pulse Generator Implant Date: 20180926

## 2019-06-03 NOTE — Progress Notes (Signed)
Carelink Summary Report / Loop Recorder 

## 2019-06-07 ENCOUNTER — Telehealth: Payer: Self-pay

## 2019-06-07 NOTE — Telephone Encounter (Signed)
Spoke to pt wife, asked to schedule pt to come in to have LINQ reprogrammed. Pt wife stated that he has appt on 9/24 and would like to address it at this appt. 1 pause alert, EGM shows SR w/ undersensing.

## 2019-06-27 LAB — CUP PACEART REMOTE DEVICE CHECK
Date Time Interrogation Session: 20200808183918
Implantable Pulse Generator Implant Date: 20180926

## 2019-06-28 ENCOUNTER — Ambulatory Visit (INDEPENDENT_AMBULATORY_CARE_PROVIDER_SITE_OTHER): Payer: PPO | Admitting: *Deleted

## 2019-06-28 DIAGNOSIS — I639 Cerebral infarction, unspecified: Secondary | ICD-10-CM

## 2019-07-06 NOTE — Progress Notes (Signed)
Carelink Summary Report / Loop Recorder 

## 2019-07-23 DIAGNOSIS — I129 Hypertensive chronic kidney disease with stage 1 through stage 4 chronic kidney disease, or unspecified chronic kidney disease: Secondary | ICD-10-CM | POA: Diagnosis not present

## 2019-07-23 DIAGNOSIS — N183 Chronic kidney disease, stage 3 (moderate): Secondary | ICD-10-CM | POA: Diagnosis not present

## 2019-07-23 DIAGNOSIS — Z9861 Coronary angioplasty status: Secondary | ICD-10-CM | POA: Diagnosis not present

## 2019-07-23 DIAGNOSIS — I2510 Atherosclerotic heart disease of native coronary artery without angina pectoris: Secondary | ICD-10-CM | POA: Diagnosis not present

## 2019-07-23 DIAGNOSIS — R7301 Impaired fasting glucose: Secondary | ICD-10-CM | POA: Diagnosis not present

## 2019-07-23 DIAGNOSIS — I6990 Unspecified sequelae of unspecified cerebrovascular disease: Secondary | ICD-10-CM | POA: Diagnosis not present

## 2019-07-23 DIAGNOSIS — E785 Hyperlipidemia, unspecified: Secondary | ICD-10-CM | POA: Diagnosis not present

## 2019-07-23 DIAGNOSIS — Z6827 Body mass index (BMI) 27.0-27.9, adult: Secondary | ICD-10-CM | POA: Diagnosis not present

## 2019-07-23 DIAGNOSIS — I480 Paroxysmal atrial fibrillation: Secondary | ICD-10-CM | POA: Diagnosis not present

## 2019-07-29 ENCOUNTER — Ambulatory Visit (INDEPENDENT_AMBULATORY_CARE_PROVIDER_SITE_OTHER): Payer: PPO | Admitting: *Deleted

## 2019-07-29 DIAGNOSIS — I6340 Cerebral infarction due to embolism of unspecified cerebral artery: Secondary | ICD-10-CM

## 2019-07-29 DIAGNOSIS — R001 Bradycardia, unspecified: Secondary | ICD-10-CM

## 2019-07-29 LAB — CUP PACEART REMOTE DEVICE CHECK
Date Time Interrogation Session: 20200910170154
Implantable Pulse Generator Implant Date: 20180926

## 2019-08-09 NOTE — Progress Notes (Signed)
Carelink Summary Report / Loop Recorder 

## 2019-08-12 ENCOUNTER — Encounter: Payer: Self-pay | Admitting: Internal Medicine

## 2019-08-12 ENCOUNTER — Ambulatory Visit: Payer: PPO | Admitting: Internal Medicine

## 2019-08-12 ENCOUNTER — Other Ambulatory Visit: Payer: Self-pay

## 2019-08-12 VITALS — BP 146/80 | HR 50 | Ht 71.0 in | Wt 198.6 lb

## 2019-08-12 DIAGNOSIS — I6340 Cerebral infarction due to embolism of unspecified cerebral artery: Secondary | ICD-10-CM

## 2019-08-12 DIAGNOSIS — I480 Paroxysmal atrial fibrillation: Secondary | ICD-10-CM | POA: Diagnosis not present

## 2019-08-12 NOTE — Progress Notes (Signed)
HPI [name] returns today for followup. He is a pleasant 78 yo man with a h/o HTN and cryptogenic stroke, s/p ILR who was found to have atrial fib and placed on Xarelto. He has done well in the interim. He has been working clearing trees in and around his house. He denies chest pain, sob, or syncope. He does not have palpitations.  Allergies  Allergen Reactions  . Hydrocodone Other (See Comments)    vicodin --"makes me crazy"     Current Outpatient Medications  Medication Sig Dispense Refill  . amLODipine (NORVASC) 2.5 MG tablet Take 1 tablet (2.5 mg total) by mouth daily. 90 tablet 3  . atorvastatin (LIPITOR) 40 MG tablet Take 1 tablet (40 mg total) by mouth daily. 90 tablet 3  . lisinopril (PRINIVIL,ZESTRIL) 20 MG tablet Take 1 tablet (20 mg total) by mouth daily. 90 tablet 3  . Multiple Vitamin (MULTIVITAMIN WITH MINERALS) TABS Take 1 tablet by mouth every morning.     . nitroGLYCERIN (NITROSTAT) 0.4 MG SL tablet DISSOLVE ONE TABLET UNDER THE TONGUE EVERY 5 MINUTES AS NEEDED FOR CHEST PAIN.  DO NOT EXCEED A TOTAL OF 3 DOSES IN 15 MINUTES 25 tablet 5  . omega-3 acid ethyl esters (LOVAZA) 1 G capsule Take 2 g by mouth every evening.     . rivaroxaban (XARELTO) 20 MG TABS tablet Take 1 tablet (20 mg total) by mouth daily with supper. 90 tablet 1  . tadalafil (CIALIS) 20 MG tablet Take 20 mg by mouth daily as needed for erectile dysfunction.     No current facility-administered medications for this visit.      Past Medical History:  Diagnosis Date  . BPH (benign prostatic hyperplasia)   . CAD (coronary artery disease) cardiologist-  dr Samuel Walls   a.  NSTEMI  05-31-2012 s/p  DES to pLAD (troponin >20).  . Chronic sinus bradycardia   . History of non-ST elevation myocardial infarction (NSTEMI)    07/ 2013   s/p  DES to LAD  . Hyperlipidemia LDL goal < 70   . Ischemic cardiomyopathy    a. 05/2012 per cardiac cath EF 35%. b. Subsequent echoes have shown EF improved to  45-50%.  . Nocturia   . PFO (patent foramen ovale)   . Prostate cancer Samuel Walls) urologist-  dr Samuel Walls/  oncologist-  dr Samuel Walls   T1c, Gleason 7,  PSA 4.9  . S/P drug eluting coronary stent placement    05-31-2012   DES x1  to pLAD  . Stroke (cerebrum) (Samuel Walls) 07/2017  . Wears glasses     ROS:   All systems reviewed and negative except as noted in the HPI.   Past Surgical History:  Procedure Laterality Date  . ANKLE CLOSED REDUCTION Left 2000  . LEFT HEART CATHETERIZATION WITH CORONARY ANGIOGRAM N/A 05/31/2012   Procedure: LEFT HEART CATHETERIZATION WITH CORONARY ANGIOGRAM;  Surgeon: Samuel Mocha, MD;  [facility];  Service: Cardiovascular;  Laterality: N/A;  PCI w/ DES x1 to pLAD (95%);  moderate stenoses of RCA and CLx;  moderate severe segmental LV dysfunction, ef 35%  . LOOP RECORDER INSERTION N/A 08/13/2017   Procedure: LOOP RECORDER INSERTION;  Surgeon: Samuel Lance, MD;  Location: [facility];  Service: Cardiovascular;  Laterality: N/A;  . PROSTATE BIOPSY    . RADIOACTIVE SEED IMPLANT N/A 01/02/2017   Procedure: RADIOACTIVE SEED IMPLANT/BRACHYTHERAPY IMPLANT;  Surgeon: Samuel Bimler, MD;  Location: [facility];  Service: Urology;  Laterality: N/A;  . TEE WITHOUT CARDIOVERSION N/A 08/13/2017   Procedure: TRANSESOPHAGEAL ECHOCARDIOGRAM (TEE);  Surgeon: Samuel Casino, MD;  Location: [facility] ENDOSCOPY;  Service: Cardiovascular;  Laterality: N/A;  . TRANSTHORACIC ECHOCARDIOGRAM  11/29/2013   midl focal basal LVH of the septum,  EF 50%, akinesis of the mid-distalanteroseptal and apical myocardium,  grade 1 diastolic dysfunction/  mild LAE/  mild dilated aortic root/  trivial MR and TR     Family History  Problem Relation Age of Onset  . ALS Mother   . Other Father   . Cancer Neg Hx      Social History   Socioeconomic History  . Marital status: Married    Spouse name: Not on file  . Number of children: Not on file  . Years of education: Not on  file  . Highest education level: Not on file  Occupational History  . Not on file  Social Needs  . Financial resource strain: Not on file  . Food insecurity    Worry: Not on file    Inability: Not on file  . Transportation needs    Medical: Not on file    Non-medical: Not on file  Tobacco Use  . Smoking status: Never Smoker  . Smokeless tobacco: Never Used  Substance and Sexual Activity  . Alcohol use: No  . Drug use: No  . Sexual activity: Not on file  Lifestyle  . Physical activity    Days per week: Not on file    Minutes per session: Not on file  . Stress: Not on file  Relationships  . Social Herbalist on phone: Not on file    Gets together: Not on file    Attends religious service: Not on file    Active member of club or organization: Not on file    Attends meetings of clubs or organizations: Not on file    Relationship status: Not on file  . Intimate partner violence    Fear of current or ex partner: Not on file    Emotionally abused: Not on file    Physically abused: Not on file    Forced sexual activity: Not on file  Other Topics Concern  . Not on file  Social History Narrative  . Not on file     BP (!) 146/80   Pulse (!) 50   Ht 5\' 11"  (1.803 m)   Wt 198 lb 9.6 oz (90.1 kg)   SpO2 96%   BMI 27.70 kg/m   Physical Exam:  Well appearing 78 yo man, NAD HEENT: Unremarkable Neck:  No JVD, no thyromegally Lymphatics:  No adenopathy Back:  No CVA tenderness Lungs:  Clear with no wheezes HEART:  Regular rate rhythm, no murmurs, no rubs, no clicks Abd:  soft, positive bowel sounds, no organomegally, no rebound, no guarding Ext:  2 plus pulses, no edema, no cyanosis, no clubbing Skin:  No rashes no nodules Neuro:  CN II through XII intact, motor grossly intact  DEVICE  Normal device function.  See PaceArt for details. Atrial fib about 1%  Assess/Plan: 1. PAF - he is maintaining NSR 99% of the time. No indication for AA drug therapy.  2.  HTN - his bp is reasonably well controlled. He is encouraged to maintain a low sodium diet. 3. Coags - he is tolerating his systemic anti-coagulation.  Samuel Walls.D.

## 2019-08-12 NOTE — Patient Instructions (Signed)

## 2019-08-14 ENCOUNTER — Other Ambulatory Visit: Payer: Self-pay | Admitting: Cardiology

## 2019-08-23 ENCOUNTER — Other Ambulatory Visit: Payer: Self-pay | Admitting: Internal Medicine

## 2019-08-31 ENCOUNTER — Ambulatory Visit (INDEPENDENT_AMBULATORY_CARE_PROVIDER_SITE_OTHER): Payer: PPO | Admitting: *Deleted

## 2019-08-31 DIAGNOSIS — I6340 Cerebral infarction due to embolism of unspecified cerebral artery: Secondary | ICD-10-CM

## 2019-08-31 DIAGNOSIS — I480 Paroxysmal atrial fibrillation: Secondary | ICD-10-CM

## 2019-09-01 LAB — CUP PACEART REMOTE DEVICE CHECK
Date Time Interrogation Session: 20201013194424
Implantable Pulse Generator Implant Date: 20180926

## 2019-09-10 NOTE — Progress Notes (Signed)
Carelink Summary Report / Loop Recorder 

## 2019-10-03 LAB — CUP PACEART REMOTE DEVICE CHECK
Date Time Interrogation Session: 20201115194419
Implantable Pulse Generator Implant Date: 20180926

## 2019-10-04 ENCOUNTER — Ambulatory Visit (INDEPENDENT_AMBULATORY_CARE_PROVIDER_SITE_OTHER): Payer: PPO | Admitting: *Deleted

## 2019-10-04 DIAGNOSIS — I480 Paroxysmal atrial fibrillation: Secondary | ICD-10-CM

## 2019-10-04 DIAGNOSIS — I6340 Cerebral infarction due to embolism of unspecified cerebral artery: Secondary | ICD-10-CM

## 2019-10-21 ENCOUNTER — Other Ambulatory Visit: Payer: Self-pay

## 2019-10-21 ENCOUNTER — Ambulatory Visit: Payer: PPO | Admitting: Cardiology

## 2019-10-21 ENCOUNTER — Encounter: Payer: Self-pay | Admitting: Cardiology

## 2019-10-21 VITALS — BP 142/78 | HR 46 | Ht 71.0 in | Wt 197.0 lb

## 2019-10-21 DIAGNOSIS — R001 Bradycardia, unspecified: Secondary | ICD-10-CM

## 2019-10-21 DIAGNOSIS — I10 Essential (primary) hypertension: Secondary | ICD-10-CM

## 2019-10-21 DIAGNOSIS — I480 Paroxysmal atrial fibrillation: Secondary | ICD-10-CM

## 2019-10-21 NOTE — Progress Notes (Signed)
Cardiology Office Note:    Date:  10/21/2019   ID:  Samuel Walls, DOB [birth year], MRN [id number]  PCP:  Nicoletta Dress, MD  Cardiologist:  Candee Furbish, MD  Electrophysiologist:  None   Referring MD: Nicoletta Dress, MD   No chief complaint on file.  Atrial fibrillation follow-up History of Present Illness:    Samuel Walls is a 78 y.o. male with a hx of cryptogenic stroke with implantable loop recorder.  Found to have A. fib here for follow-up.  Currently on Xarelto.  Asymptomatic.  Doing very well without any fevers chills nausea vomiting syncope bleeding Overall feels great.  Past Medical History:  Diagnosis Date  . BPH (benign prostatic hyperplasia)   . CAD (coronary artery disease) cardiologist-  dr Joellyn Quails   a.  NSTEMI  05-31-2012 s/p  DES to pLAD (troponin >20).  . Chronic sinus bradycardia   . History of non-ST elevation myocardial infarction (NSTEMI)    07/ 2013   s/p  DES to LAD  . Hyperlipidemia LDL goal < 70   . Ischemic cardiomyopathy    a. 05/2012 per cardiac cath EF 35%. b. Subsequent echoes have shown EF improved to 45-50%.  . Nocturia   . PFO (patent foramen ovale)   . Prostate cancer [facility]) urologist-  dr chao/  oncologist-  dr Tammi Klippel   T1c, Gleason 7,  PSA 4.9  . S/P drug eluting coronary stent placement    05-31-2012   DES x1  to pLAD  . Stroke (cerebrum) ([place]) 07/2017  . Wears glasses     Past Surgical History:  Procedure Laterality Date  . ANKLE CLOSED REDUCTION Left 2000  . LEFT HEART CATHETERIZATION WITH CORONARY ANGIOGRAM N/A 05/31/2012   Procedure: LEFT HEART CATHETERIZATION WITH CORONARY ANGIOGRAM;  Surgeon: Sherren Mocha, MD;  Location: [facility] CATH LAB;  Service: Cardiovascular;  Laterality: N/A;  PCI w/ DES x1 to pLAD (95%);  moderate stenoses of RCA and CLx;  moderate severe segmental LV dysfunction, ef 35%  . LOOP RECORDER INSERTION N/A 08/13/2017   Procedure: LOOP RECORDER INSERTION;  Surgeon: Evans Lance, MD;  Location: [place] CV LAB;  Service: Cardiovascular;  Laterality: N/A;  . PROSTATE BIOPSY    . RADIOACTIVE SEED IMPLANT N/A 01/02/2017   Procedure: RADIOACTIVE SEED IMPLANT/BRACHYTHERAPY IMPLANT;  Surgeon: Joie Bimler, MD;  Location: [facility];  Service: Urology;  Laterality: N/A;  . TEE WITHOUT CARDIOVERSION N/A 08/13/2017   Procedure: TRANSESOPHAGEAL ECHOCARDIOGRAM (TEE);  Surgeon: Pixie Casino, MD;  Location: [facility] ENDOSCOPY;  Service: Cardiovascular;  Laterality: N/A;  . TRANSTHORACIC ECHOCARDIOGRAM  11/29/2013   midl focal basal LVH of the septum,  EF 50%, akinesis of the mid-distalanteroseptal and apical myocardium,  grade 1 diastolic dysfunction/  mild LAE/  mild dilated aortic root/  trivial MR and TR    Current Medications: Current Meds  Medication Sig  . amLODipine (NORVASC) 2.5 MG tablet Take 1 tablet (2.5 mg total) by mouth daily.  Marland Kitchen atorvastatin (LIPITOR) 40 MG tablet Take 1 tablet (40 mg total) by mouth daily.  Marland Kitchen lisinopril (ZESTRIL) 20 MG tablet Take 1 tablet by mouth once daily  . Multiple Vitamin (MULTIVITAMIN WITH MINERALS) TABS Take 1 tablet by mouth every morning.   . nitroGLYCERIN (NITROSTAT) 0.4 MG SL tablet DISSOLVE ONE TABLET UNDER THE TONGUE EVERY 5 MINUTES AS NEEDED FOR CHEST PAIN.  DO NOT EXCEED A TOTAL OF 3 DOSES IN 15 MINUTES  . omega-3 acid ethyl esters (LOVAZA)  1 G capsule Take 2 g by mouth every evening.   . rivaroxaban (XARELTO) 20 MG TABS tablet Take 1 tablet (20 mg total) by mouth daily with supper.  . tadalafil (CIALIS) 20 MG tablet Take 20 mg by mouth daily as needed for erectile dysfunction.     Allergies:   Hydrocodone   Social History   Socioeconomic History  . Marital status: Married    Spouse name: Not on file  . Number of children: Not on file  . Years of education: Not on file  . Highest education level: Not on file  Occupational History  . Not on file  Social Needs  . Financial resource strain: Not on file  . Food insecurity     Worry: Not on file    Inability: Not on file  . Transportation needs    Medical: Not on file    Non-medical: Not on file  Tobacco Use  . Smoking status: Never Smoker  . Smokeless tobacco: Never Used  Substance and Sexual Activity  . Alcohol use: No  . Drug use: No  . Sexual activity: Not on file  Lifestyle  . Physical activity    Days per week: Not on file    Minutes per session: Not on file  . Stress: Not on file  Relationships  . Social Herbalist on phone: Not on file    Gets together: Not on file    Attends religious service: Not on file    Active member of club or organization: Not on file    Attends meetings of clubs or organizations: Not on file    Relationship status: Not on file  Other Topics Concern  . Not on file  Social History Narrative  . Not on file     Family History: The patient's family history includes ALS in his mother; Other in his father. There is no history of Cancer.  ROS:   Please see the history of present illness.    Denies any fevers chills nausea vomiting syncope bleeding all other systems reviewed and are negative.  EKGs/Labs/Other Studies Reviewed:    The following studies were reviewed today:  Transesophageal echocardiogram 08/13/2017 - Left ventricle: There was mild concentric hypertrophy. Systolic   function was mildly reduced. The estimated ejection fraction was   in the range of 45% to 50%. Distal anteroapical hypokinesis. - Mitral valve: No evidence of vegetation. - Left atrium: The atrium was dilated. No evidence of thrombus in   the atrial cavity or appendage. - Right atrium: No evidence of thrombus in the atrial cavity or   appendage. - Atrial septum: Aneurysmal. There is left to right flow by color   doppler suggestive of PFO. Right to left flow by saline   microbubble contrast was seen intermittently without and with   provocation. - Pulmonic valve: No evidence of vegetation.  EKG:  EKG is  ordered today.  The  ekg ordered today demonstrates sinus bradycardia 43 no other significant abnormalities.  Recent Labs: No results found for requested labs within last 8760 hours.  Recent Lipid Panel    Component Value Date/Time   CHOL 101 08/12/2017 0233   TRIG 90 08/12/2017 0233   HDL 38 (L) 08/12/2017 0233   CHOLHDL 2.7 08/12/2017 0233   VLDL 18 08/12/2017 0233   LDLCALC 45 08/12/2017 0233    Physical Exam:    VS:  BP (!) 142/78   Pulse (!) 46   Ht 5\' 11"  (1.803  m)   Wt 197 lb (89.4 kg)   SpO2 96%   BMI 27.48 kg/m     Wt Readings from Last 3 Encounters:  10/21/19 197 lb (89.4 kg)  08/12/19 198 lb 9.6 oz (90.1 kg)  04/28/19 193 lb 9.6 oz (87.8 kg)     GEN:  Well nourished, well developed in no acute distress HEENT: Normal NECK: No JVD; No carotid bruits LYMPHATICS: No lymphadenopathy CARDIAC: Bradycardic regular, no murmurs, rubs, gallops RESPIRATORY:  Clear to auscultation without rales, wheezing or rhonchi  ABDOMEN: Soft, non-tender, non-distended MUSCULOSKELETAL:  No edema; No deformity  SKIN: Warm and dry NEUROLOGIC:  Alert and oriented x 3 PSYCHIATRIC:  Normal affect   ASSESSMENT:    1. Paroxysmal atrial fibrillation (HCC)   2. Sinus bradycardia   3. Essential hypertension    PLAN:    In order of problems listed above:  Paroxysmal atrial fibrillation -Caught on implantable loop recorder, currently on Xarelto.  Doing well, no bleeding, previous creatinine 1.2 hemoglobin 13.5.  No changes made.  Implantable loop recorder shows 99% normal sinus rhythm.  No need for antiarrhythmic drug therapy.  He has been seen by Dr. Lovena Le  Coronary artery disease -Status post PCI as above.  No anginal symptoms doing well.  Excellent.  Sinus bradycardia - Asymptomatic heart rate 43 on ECG.  Not on any AV nodal blocking agents.  This is been a longstanding issue for him.  No pacemaker warranted.  Hyperlipidemia - Continue with statin therapy.  Excellent.  No myalgias.  LDL 60.   Prior stroke - Could have been secondary to atrial fibrillation since it was caught on the implantable loop recorder.  Currently on anticoagulation, no further neurologic symptoms.  Essential hypertension - Lisinopril.  Stable.  Slightly elevated at today's visit however previously normal.   Medication Adjustments/Labs and Tests Ordered: Current medicines are reviewed at length with the patient today.  Concerns regarding medicines are outlined above.  No orders of the defined types were placed in this encounter.  No orders of the defined types were placed in this encounter.   Patient Instructions  Medication Instructions:  Your physician recommends that you continue on your current medications as directed. Please refer to the Current Medication list given to you today.  *If you need a refill on your cardiac medications before your next appointment, please call your pharmacy*  Follow-Up: At [facility], you and your health needs are our priority.  As part of our continuing mission to provide you with exceptional heart care, we have created designated Provider Care Teams.  These Care Teams include your primary Cardiologist (physician) and Advanced Practice Providers (APPs -  Physician Assistants and Nurse Practitioners) who all work together to provide you with the care you need, when you need it.  Your next appointment:   1 year(s)  The format for your next appointment:   In Person  Provider:   Candee Furbish, MD      Signed, Candee Furbish, MD  10/21/2019 4:28 PM    [place]

## 2019-10-21 NOTE — Patient Instructions (Signed)
Medication Instructions:  Your physician recommends that you continue on your current medications as directed. Please refer to the Current Medication list given to you today.  *If you need a refill on your cardiac medications before your next appointment, please call your pharmacy*  Follow-Up: At [facility], you and your health needs are our priority.  As part of our continuing mission to provide you with exceptional heart care, we have created designated Provider Care Teams.  These Care Teams include your primary Cardiologist (physician) and Advanced Practice Providers (APPs -  Physician Assistants and Nurse Practitioners) who all work together to provide you with the care you need, when you need it.  Your next appointment:   1 year(s)  The format for your next appointment:   In Person  Provider:   Candee Furbish, MD

## 2019-10-29 NOTE — Progress Notes (Signed)
Carelink Summary Report / Loop Recorder 

## 2019-11-08 ENCOUNTER — Ambulatory Visit (INDEPENDENT_AMBULATORY_CARE_PROVIDER_SITE_OTHER): Payer: PPO | Admitting: *Deleted

## 2019-11-08 DIAGNOSIS — I255 Ischemic cardiomyopathy: Secondary | ICD-10-CM | POA: Diagnosis not present

## 2019-11-08 LAB — CUP PACEART REMOTE DEVICE CHECK
Date Time Interrogation Session: 20201218144623
Implantable Pulse Generator Implant Date: 20180926

## 2019-12-13 ENCOUNTER — Ambulatory Visit (INDEPENDENT_AMBULATORY_CARE_PROVIDER_SITE_OTHER): Payer: PPO | Admitting: *Deleted

## 2019-12-13 DIAGNOSIS — I255 Ischemic cardiomyopathy: Secondary | ICD-10-CM

## 2019-12-13 LAB — CUP PACEART REMOTE DEVICE CHECK
Date Time Interrogation Session: 20210124232224
Implantable Pulse Generator Implant Date: 20180926

## 2020-01-17 ENCOUNTER — Ambulatory Visit (INDEPENDENT_AMBULATORY_CARE_PROVIDER_SITE_OTHER): Payer: PPO | Admitting: *Deleted

## 2020-01-17 DIAGNOSIS — I255 Ischemic cardiomyopathy: Secondary | ICD-10-CM

## 2020-01-17 LAB — CUP PACEART REMOTE DEVICE CHECK
Date Time Interrogation Session: 20210228232656
Implantable Pulse Generator Implant Date: 20180926

## 2020-01-18 NOTE — Progress Notes (Signed)
ILR Remote 

## 2020-01-24 DIAGNOSIS — R7301 Impaired fasting glucose: Secondary | ICD-10-CM | POA: Diagnosis not present

## 2020-01-24 DIAGNOSIS — I2510 Atherosclerotic heart disease of native coronary artery without angina pectoris: Secondary | ICD-10-CM | POA: Diagnosis not present

## 2020-01-24 DIAGNOSIS — Z125 Encounter for screening for malignant neoplasm of prostate: Secondary | ICD-10-CM | POA: Diagnosis not present

## 2020-01-24 DIAGNOSIS — I129 Hypertensive chronic kidney disease with stage 1 through stage 4 chronic kidney disease, or unspecified chronic kidney disease: Secondary | ICD-10-CM | POA: Diagnosis not present

## 2020-01-24 DIAGNOSIS — I6990 Unspecified sequelae of unspecified cerebrovascular disease: Secondary | ICD-10-CM | POA: Diagnosis not present

## 2020-01-24 DIAGNOSIS — Z9861 Coronary angioplasty status: Secondary | ICD-10-CM | POA: Diagnosis not present

## 2020-01-24 DIAGNOSIS — C61 Malignant neoplasm of prostate: Secondary | ICD-10-CM | POA: Diagnosis not present

## 2020-01-24 DIAGNOSIS — N183 Chronic kidney disease, stage 3 unspecified: Secondary | ICD-10-CM | POA: Diagnosis not present

## 2020-01-24 DIAGNOSIS — E785 Hyperlipidemia, unspecified: Secondary | ICD-10-CM | POA: Diagnosis not present

## 2020-01-24 DIAGNOSIS — Z6828 Body mass index (BMI) 28.0-28.9, adult: Secondary | ICD-10-CM | POA: Diagnosis not present

## 2020-01-24 DIAGNOSIS — I480 Paroxysmal atrial fibrillation: Secondary | ICD-10-CM | POA: Diagnosis not present

## 2020-02-17 ENCOUNTER — Ambulatory Visit (INDEPENDENT_AMBULATORY_CARE_PROVIDER_SITE_OTHER): Payer: PPO | Admitting: *Deleted

## 2020-02-17 DIAGNOSIS — I255 Ischemic cardiomyopathy: Secondary | ICD-10-CM | POA: Diagnosis not present

## 2020-02-17 LAB — CUP PACEART REMOTE DEVICE CHECK
Date Time Interrogation Session: 20210401002955
Implantable Pulse Generator Implant Date: 20180926

## 2020-02-17 NOTE — Progress Notes (Signed)
ILR Remote 

## 2020-03-20 ENCOUNTER — Ambulatory Visit (INDEPENDENT_AMBULATORY_CARE_PROVIDER_SITE_OTHER): Payer: PPO | Admitting: *Deleted

## 2020-03-20 DIAGNOSIS — I6340 Cerebral infarction due to embolism of unspecified cerebral artery: Secondary | ICD-10-CM

## 2020-03-20 LAB — CUP PACEART REMOTE DEVICE CHECK
Date Time Interrogation Session: 20210502003353
Implantable Pulse Generator Implant Date: 20180926

## 2020-03-21 NOTE — Progress Notes (Signed)
Carelink Summary Report / Loop Recorder 

## 2020-04-13 DIAGNOSIS — Z139 Encounter for screening, unspecified: Secondary | ICD-10-CM | POA: Diagnosis not present

## 2020-04-13 DIAGNOSIS — Z1331 Encounter for screening for depression: Secondary | ICD-10-CM | POA: Diagnosis not present

## 2020-04-13 DIAGNOSIS — Z0000 Encounter for general adult medical examination without abnormal findings: Secondary | ICD-10-CM | POA: Diagnosis not present

## 2020-04-13 DIAGNOSIS — Z9181 History of falling: Secondary | ICD-10-CM | POA: Diagnosis not present

## 2020-04-13 DIAGNOSIS — E785 Hyperlipidemia, unspecified: Secondary | ICD-10-CM | POA: Diagnosis not present

## 2020-04-20 ENCOUNTER — Ambulatory Visit (INDEPENDENT_AMBULATORY_CARE_PROVIDER_SITE_OTHER): Payer: PPO | Admitting: *Deleted

## 2020-04-20 DIAGNOSIS — I6340 Cerebral infarction due to embolism of unspecified cerebral artery: Secondary | ICD-10-CM

## 2020-04-20 LAB — CUP PACEART REMOTE DEVICE CHECK
Date Time Interrogation Session: 20210602230306
Implantable Pulse Generator Implant Date: 20180926

## 2020-04-25 NOTE — Progress Notes (Signed)
Carelink Summary Report / Loop Recorder 

## 2020-05-05 ENCOUNTER — Other Ambulatory Visit: Payer: Self-pay | Admitting: Cardiology

## 2020-05-05 NOTE — Telephone Encounter (Signed)
Pt last saw Dr Marlou Porch 10/21/19, last labs 01/24/20 Creat 1.19 at [facility] per [place], age 79, weight 89.4kg, CrCl 64.69, based on CrCl pt is on appropriate dosage of Xarelto 20mg  QD.  Will refill rx.

## 2020-05-23 ENCOUNTER — Ambulatory Visit (INDEPENDENT_AMBULATORY_CARE_PROVIDER_SITE_OTHER): Payer: PPO | Admitting: *Deleted

## 2020-05-23 DIAGNOSIS — I6340 Cerebral infarction due to embolism of unspecified cerebral artery: Secondary | ICD-10-CM

## 2020-05-23 LAB — CUP PACEART REMOTE DEVICE CHECK
Date Time Interrogation Session: 20210706021851
Implantable Pulse Generator Implant Date: 20180926

## 2020-05-25 NOTE — Progress Notes (Signed)
Carelink Summary Report / Loop Recorder 

## 2020-06-06 DIAGNOSIS — H2513 Age-related nuclear cataract, bilateral: Secondary | ICD-10-CM | POA: Diagnosis not present

## 2020-06-06 DIAGNOSIS — H40033 Anatomical narrow angle, bilateral: Secondary | ICD-10-CM | POA: Diagnosis not present

## 2020-06-09 DIAGNOSIS — L918 Other hypertrophic disorders of the skin: Secondary | ICD-10-CM | POA: Diagnosis not present

## 2020-06-09 DIAGNOSIS — D485 Neoplasm of uncertain behavior of skin: Secondary | ICD-10-CM | POA: Diagnosis not present

## 2020-06-09 DIAGNOSIS — C4442 Squamous cell carcinoma of skin of scalp and neck: Secondary | ICD-10-CM | POA: Diagnosis not present

## 2020-06-09 DIAGNOSIS — D0421 Carcinoma in situ of skin of right ear and external auricular canal: Secondary | ICD-10-CM | POA: Diagnosis not present

## 2020-06-09 DIAGNOSIS — L821 Other seborrheic keratosis: Secondary | ICD-10-CM | POA: Diagnosis not present

## 2020-06-22 ENCOUNTER — Ambulatory Visit (INDEPENDENT_AMBULATORY_CARE_PROVIDER_SITE_OTHER): Payer: PPO | Admitting: *Deleted

## 2020-06-22 DIAGNOSIS — I6340 Cerebral infarction due to embolism of unspecified cerebral artery: Secondary | ICD-10-CM | POA: Diagnosis not present

## 2020-06-26 LAB — CUP PACEART REMOTE DEVICE CHECK
Date Time Interrogation Session: 20210808021834
Implantable Pulse Generator Implant Date: 20180926

## 2020-06-26 NOTE — Progress Notes (Signed)
Carelink Summary Report / Loop Recorder 

## 2020-07-25 ENCOUNTER — Ambulatory Visit (INDEPENDENT_AMBULATORY_CARE_PROVIDER_SITE_OTHER): Payer: PPO | Admitting: *Deleted

## 2020-07-25 DIAGNOSIS — I6340 Cerebral infarction due to embolism of unspecified cerebral artery: Secondary | ICD-10-CM | POA: Diagnosis not present

## 2020-07-26 DIAGNOSIS — R7301 Impaired fasting glucose: Secondary | ICD-10-CM | POA: Diagnosis not present

## 2020-07-26 DIAGNOSIS — I129 Hypertensive chronic kidney disease with stage 1 through stage 4 chronic kidney disease, or unspecified chronic kidney disease: Secondary | ICD-10-CM | POA: Diagnosis not present

## 2020-07-26 DIAGNOSIS — I6990 Unspecified sequelae of unspecified cerebrovascular disease: Secondary | ICD-10-CM | POA: Diagnosis not present

## 2020-07-26 DIAGNOSIS — E785 Hyperlipidemia, unspecified: Secondary | ICD-10-CM | POA: Diagnosis not present

## 2020-07-26 DIAGNOSIS — N183 Chronic kidney disease, stage 3 unspecified: Secondary | ICD-10-CM | POA: Diagnosis not present

## 2020-07-26 DIAGNOSIS — I480 Paroxysmal atrial fibrillation: Secondary | ICD-10-CM | POA: Diagnosis not present

## 2020-07-26 DIAGNOSIS — I2510 Atherosclerotic heart disease of native coronary artery without angina pectoris: Secondary | ICD-10-CM | POA: Diagnosis not present

## 2020-07-26 DIAGNOSIS — Z9861 Coronary angioplasty status: Secondary | ICD-10-CM | POA: Diagnosis not present

## 2020-07-26 DIAGNOSIS — Z8546 Personal history of malignant neoplasm of prostate: Secondary | ICD-10-CM | POA: Diagnosis not present

## 2020-07-26 DIAGNOSIS — Z6827 Body mass index (BMI) 27.0-27.9, adult: Secondary | ICD-10-CM | POA: Diagnosis not present

## 2020-07-28 LAB — CUP PACEART REMOTE DEVICE CHECK
Date Time Interrogation Session: 20210910022150
Implantable Pulse Generator Implant Date: 20180926

## 2020-07-28 NOTE — Progress Notes (Signed)
Carelink Summary Report / Loop Recorder 

## 2020-08-09 ENCOUNTER — Ambulatory Visit: Payer: PPO | Admitting: Internal Medicine

## 2020-08-09 ENCOUNTER — Encounter: Payer: Self-pay | Admitting: Internal Medicine

## 2020-08-09 ENCOUNTER — Other Ambulatory Visit: Payer: Self-pay

## 2020-08-09 VITALS — BP 142/64 | HR 47 | Ht 70.0 in | Wt 194.8 lb

## 2020-08-09 DIAGNOSIS — I10 Essential (primary) hypertension: Secondary | ICD-10-CM | POA: Diagnosis not present

## 2020-08-09 DIAGNOSIS — I6340 Cerebral infarction due to embolism of unspecified cerebral artery: Secondary | ICD-10-CM | POA: Diagnosis not present

## 2020-08-09 DIAGNOSIS — I480 Paroxysmal atrial fibrillation: Secondary | ICD-10-CM | POA: Diagnosis not present

## 2020-08-09 NOTE — Progress Notes (Signed)
HPI Samuel Walls returns today for ongoing evaluation.  He is a 79 year old man with sinus node dysfunction, a cryptogenic stroke, who developed atrial fibrillation which was documented on implantable loop recorder.  He was placed on systemic anticoagulation.  He has done well in the interim.  Despite fairly marked sinus bradycardia, he does not have symptoms.  He has had no documented prolonged pauses.  He has had very little atrial fibrillation in the last year.  He feels well.  He denies chest pain or shortness of breath.  He remains active. Allergies  Allergen Reactions   Hydrocodone Other (See Comments)    vicodin --"makes me crazy"     Current Outpatient Medications  Medication Sig Dispense Refill   amLODipine (NORVASC) 2.5 MG tablet Take 1 tablet by mouth once daily 90 tablet 1   atorvastatin (LIPITOR) 40 MG tablet Take 1 tablet by mouth once daily 90 tablet 1   lisinopril (ZESTRIL) 20 MG tablet Take 1 tablet by mouth once daily 90 tablet 3   Multiple Vitamin (MULTIVITAMIN WITH MINERALS) TABS Take 1 tablet by mouth every morning.      nitroGLYCERIN (NITROSTAT) 0.4 MG SL tablet DISSOLVE ONE TABLET UNDER THE TONGUE EVERY 5 MINUTES AS NEEDED FOR CHEST PAIN.  DO NOT EXCEED A TOTAL OF 3 DOSES IN 15 MINUTES 25 tablet 5   omega-3 acid ethyl esters (LOVAZA) 1 G capsule Take 2 g by mouth every evening.      tadalafil (CIALIS) 20 MG tablet Take 20 mg by mouth daily as needed for erectile dysfunction.     XARELTO 20 MG TABS tablet TAKE 1 TABLET BY MOUTH ONCE DAILY WITH  SUPPER 90 tablet 1   No current facility-administered medications for this visit.     Past Medical History:  Diagnosis Date   BPH (benign prostatic hyperplasia)    CAD (coronary artery disease) cardiologist-  dr Joellyn Quails   a.  NSTEMI  05-31-2012 s/p  DES to pLAD (troponin >20).   Chronic sinus bradycardia    History of non-ST elevation myocardial infarction (NSTEMI)    07/ 2013   s/p  DES to LAD     Hyperlipidemia LDL goal < 70    Ischemic cardiomyopathy    a. 05/2012 per cardiac cath EF 35%. b. Subsequent echoes have shown EF improved to 45-50%.   Nocturia    PFO (patent foramen ovale)    Prostate cancer [facility]) urologist-  dr chao/  oncologist-  dr Tammi Klippel   T1c, Gleason 7,  PSA 4.9   S/P drug eluting coronary stent placement    05-31-2012   DES x1  to pLAD   Stroke (cerebrum) ([place]) 07/2017   Wears glasses     ROS:   All systems reviewed and negative except as noted in the HPI.   Past Surgical History:  Procedure Laterality Date   ANKLE CLOSED REDUCTION Left 2000   LEFT HEART CATHETERIZATION WITH CORONARY ANGIOGRAM N/A 05/31/2012   Procedure: LEFT HEART CATHETERIZATION WITH CORONARY ANGIOGRAM;  Surgeon: Sherren Mocha, MD;  Location: [facility] CATH LAB;  Service: Cardiovascular;  Laterality: N/A;  PCI w/ DES x1 to pLAD (95%);  moderate stenoses of RCA and CLx;  moderate severe segmental LV dysfunction, ef 35%   LOOP RECORDER INSERTION N/A 08/13/2017   Procedure: LOOP RECORDER INSERTION;  Surgeon: Evans Lance, MD;  Location: [place] CV LAB;  Service: Cardiovascular;  Laterality: N/A;   PROSTATE BIOPSY     RADIOACTIVE SEED  IMPLANT N/A 01/02/2017   Procedure: RADIOACTIVE SEED IMPLANT/BRACHYTHERAPY IMPLANT;  Surgeon: Joie Bimler, MD;  Location: [facility];  Service: Urology;  Laterality: N/A;   TEE WITHOUT CARDIOVERSION N/A 08/13/2017   Procedure: TRANSESOPHAGEAL ECHOCARDIOGRAM (TEE);  Surgeon: Pixie Casino, MD;  Location: [facility] ENDOSCOPY;  Service: Cardiovascular;  Laterality: N/A;   TRANSTHORACIC ECHOCARDIOGRAM  11/29/2013   midl focal basal LVH of the septum,  EF 50%, akinesis of the mid-distalanteroseptal and apical myocardium,  grade 1 diastolic dysfunction/  mild LAE/  mild dilated aortic root/  trivial MR and TR     Family History  Problem Relation Age of Onset   ALS Mother    Other Father    Cancer Neg Hx      Social History    Socioeconomic History   Marital status: Married    Spouse name: Not on file   Number of children: Not on file   Years of education: Not on file   Highest education level: Not on file  Occupational History   Not on file  Tobacco Use   Smoking status: Never Smoker   Smokeless tobacco: Never Used  Vaping Use   Vaping Use: Never used  Substance and Sexual Activity   Alcohol use: No   Drug use: No   Sexual activity: Not on file  Other Topics Concern   Not on file  Social History Narrative   Not on file   Social Determinants of Health   Financial Resource Strain:    Difficulty of Paying Living Expenses: Not on file  Food Insecurity:    Worried About Running Out of Food in the Last Year: Not on file   Ran Out of Food in the Last Year: Not on file  Transportation Needs:    Lack of Transportation (Medical): Not on file   Lack of Transportation (Non-Medical): Not on file  Physical Activity:    Days of Exercise per Week: Not on file   Minutes of Exercise per Session: Not on file  Stress:    Feeling of Stress : Not on file  Social Connections:    Frequency of Communication with Friends and Family: Not on file   Frequency of Social Gatherings with Friends and Family: Not on file   Attends Religious Services: Not on file   Active Member of Clubs or Organizations: Not on file   Attends Archivist Meetings: Not on file   Marital Status: Not on file  Intimate Partner Violence:    Fear of Current or Ex-Partner: Not on file   Emotionally Abused: Not on file   Physically Abused: Not on file   Sexually Abused: Not on file     BP (!) 142/64    Pulse (!) 47    Ht 5\' 10"  (1.778 m)    Wt 194 lb 12.8 oz (88.4 kg)    SpO2 97%    BMI 27.95 kg/m   Physical Exam:  Well appearing 79 year old man, NAD HEENT: Unremarkable Neck:  No JVD, no thyromegally Lymphatics:  No adenopathy Back:  No CVA tenderness Lungs:  Clear, with no wheezes, rales,  or rhonchi HEART:  Regular bradycardic rhythm, no murmurs, no rubs, no clicks Abd:  soft, positive bowel sounds, no organomegally, no rebound, no guarding Ext:  2 plus pulses, no edema, no cyanosis, no clubbing Skin:  No rashes no nodules Neuro:  CN II through XII intact, motor grossly intact  EKG -sinus bradycardia at 44 bpm.  Poor R wave  progression.  DEVICE  Normal device function.  See PaceArt for details.  Approaching RRT  Assess/Plan: 1.  Sinus node dysfunction -he has significant bradycardia but is asymptomatic.  We discussed the symptoms he might experience if his sinus node dysfunction worsened and he were to require permanent pacemaker insertion. 2.  Hypertension -his systolic blood pressure is slightly elevated.  He will maintain a low-sodium diet 3.  Paroxysmal atrial fibrillation -he is minimally symptomatic.  He is maintaining sinus rhythm.  He will continue systemic anticoagulation. 4.  Coronary artery disease -he is status post percutaneous coronary intervention and has no anginal symptoms.  Crissie Sickles, MD

## 2020-08-09 NOTE — Patient Instructions (Addendum)
Medication Instructions:  Your physician recommends that you continue on your current medications as directed. Please refer to the Current Medication list given to you today.  Labwork: None ordered.  Testing/Procedures: None ordered.  Follow-Up: Your physician wants you to follow-up in: 6 months with Dr. Lovena Le for loop recorder removal.  February 07, 2020 at 8:30 am at the [facility] office  Any Other Special Instructions Will Be Listed Below (If Applicable).  If you need a refill on your cardiac medications before your next appointment, please call your pharmacy.

## 2020-08-24 ENCOUNTER — Ambulatory Visit: Payer: PPO

## 2020-08-30 ENCOUNTER — Other Ambulatory Visit: Payer: Self-pay | Admitting: Cardiology

## 2020-08-30 LAB — CUP PACEART REMOTE DEVICE CHECK
Date Time Interrogation Session: 20211013022216
Implantable Pulse Generator Implant Date: 20180926

## 2020-09-01 ENCOUNTER — Other Ambulatory Visit: Payer: Self-pay | Admitting: Cardiology

## 2020-09-06 ENCOUNTER — Other Ambulatory Visit: Payer: Self-pay | Admitting: Cardiology

## 2020-09-06 NOTE — Telephone Encounter (Signed)
Pt's medication was sent to pt's pharmacy as requested. Confirmation received.  °

## 2020-09-25 ENCOUNTER — Ambulatory Visit (INDEPENDENT_AMBULATORY_CARE_PROVIDER_SITE_OTHER): Payer: PPO

## 2020-09-25 DIAGNOSIS — I6340 Cerebral infarction due to embolism of unspecified cerebral artery: Secondary | ICD-10-CM | POA: Diagnosis not present

## 2020-09-26 LAB — CUP PACEART REMOTE DEVICE CHECK
Date Time Interrogation Session: 20211107230902
Implantable Pulse Generator Implant Date: 20180926

## 2020-09-27 NOTE — Progress Notes (Signed)
Carelink Summary Report / Loop Recorder 

## 2020-10-22 NOTE — Progress Notes (Signed)
Virtual Visit via Telephone Note   This visit type was conducted due to national recommendations for restrictions regarding the COVID-19 Pandemic (e.g. social distancing) in an effort to limit this patient's exposure and mitigate transmission in our community.  Due to his co-morbid illnesses, this patient is at least at moderate risk for complications without adequate follow up.  This format is felt to be most appropriate for this patient at this time.  The patient did not have access to video technology/had technical difficulties with video requiring transitioning to audio format only (telephone).  All issues noted in this document were discussed and addressed.  No physical exam could be performed with this format.  Please refer to the patient's chart for his  consent to telehealth for [facility].    Date:  10/23/2020   ID:  Gwendalyn Ege, DOB 1941/03/29, MRN [id number] The patient was identified using 2 identifiers.  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 male with CAD and CVA, PAF. Last seen by Dr. Marlou Porch 10/21/19.  Hx cryptogenic stroke with loop recorder placed   And found to hae a fib.  Placed on xarelto.     Hx NSTEMI 2013 WITH pci to pLAD he had moderate stenosises of RVA and CX EF 35%   On TEE EF 45-50%  No thrombus or masses  The atrial septum is aneurysmal. There is a small PFO noted by color doppler with left to right flow predominantly. There is intermittent spontaneous and provoked right to left flow of saline microbubble contrast noted as well.  Seen by Dr. Lovena Le he does have marked sinus brady symptomatic -if develops symptoms would need PPM  Today No BP or pulse recorded. He will have checked at place of work or [facility].   No chest pain, no SOB, no weakness or lightheadedness.  Was at work when I called works with quality at work.  acitve eats healthy.  No COVID infection this past year and has been vaccinated but no booster yet.    The patient does not have symptoms concerning for COVID-19 infection (fever, chills, cough, or new shortness of breath).    Past Medical History:  Diagnosis Date  . BPH (benign prostatic hyperplasia)   . CAD (coronary artery disease) cardiologist-  dr Joellyn Quails   a.  NSTEMI  05-31-2012 s/p  DES to pLAD (troponin >20).  . Chronic sinus bradycardia   . History of non-ST elevation myocardial infarction (NSTEMI)    07/ 2013   s/p  DES to LAD  . Hyperlipidemia LDL goal < 70   . Ischemic cardiomyopathy    a. 05/2012 per cardiac cath EF 35%. b. Subsequent echoes have shown EF improved to 45-50%.  . Nocturia   . PFO (patent foramen ovale)   . Prostate cancer [facility]) urologist-  dr chao/  oncologist-  dr Tammi Klippel   T1c, Gleason 7,  PSA 4.9  . S/P drug eluting coronary stent placement    05-31-2012   DES x1  to pLAD  . Stroke (cerebrum) ([place]) 07/2017  . Wears glasses    Past Surgical History:  Procedure Laterality Date  . ANKLE CLOSED REDUCTION Left 2000  . LEFT HEART CATHETERIZATION WITH CORONARY ANGIOGRAM N/A  05/31/2012   Procedure: LEFT HEART CATHETERIZATION WITH CORONARY ANGIOGRAM;  Surgeon: Sherren Mocha, MD;  Location: [facility] CATH LAB;  Service: Cardiovascular;  Laterality: N/A;  PCI w/ DES x1 to pLAD (95%);  moderate stenoses of RCA and CLx;  moderate severe segmental LV dysfunction, ef 35%  . LOOP RECORDER INSERTION N/A 08/13/2017   Procedure: LOOP RECORDER INSERTION;  Surgeon: Evans Lance, MD;  Location: [facility] CV LAB;  Service: Cardiovascular;  Laterality: N/A;  . PROSTATE BIOPSY    . RADIOACTIVE SEED IMPLANT N/A 01/02/2017   Procedure: RADIOACTIVE SEED IMPLANT/BRACHYTHERAPY IMPLANT;  Surgeon: Joie Bimler, MD;  Location: [facility];  Service: Urology;   Laterality: N/A;  . TEE WITHOUT CARDIOVERSION N/A 08/13/2017   Procedure: TRANSESOPHAGEAL ECHOCARDIOGRAM (TEE);  Surgeon: Pixie Casino, MD;  Location: [facility] ENDOSCOPY;  Service: Cardiovascular;  Laterality: N/A;  . TRANSTHORACIC ECHOCARDIOGRAM  11/29/2013   midl focal basal LVH of the septum,  EF 50%, akinesis of the mid-distalanteroseptal and apical myocardium,  grade 1 diastolic dysfunction/  mild LAE/  mild dilated aortic root/  trivial MR and TR     Current Meds  Medication Sig  . amLODipine (NORVASC) 2.5 MG tablet Take 1 tablet (2.5 mg total) by mouth daily.  Marland Kitchen atorvastatin (LIPITOR) 40 MG tablet Take 1 tablet (40 mg total) by mouth daily.  Marland Kitchen lisinopril (ZESTRIL) 20 MG tablet Take 1 tablet (20 mg total) by mouth daily.  . Multiple Vitamin (MULTIVITAMIN WITH MINERALS) TABS Take 1 tablet by mouth every morning.   . nitroGLYCERIN (NITROSTAT) 0.4 MG SL tablet DISSOLVE ONE TABLET UNDER THE TONGUE EVERY 5 MINUTES AS NEEDED FOR CHEST PAIN.  DO NOT EXCEED A TOTAL OF 3 DOSES IN 15 MINUTES  . omega-3 acid ethyl esters (LOVAZA) 1 G capsule Take 2 g by mouth every evening.   . tadalafil (CIALIS) 20 MG tablet Take 20 mg by mouth daily as needed for erectile dysfunction.  Alveda Reasons 20 MG TABS tablet TAKE 1 TABLET BY MOUTH ONCE DAILY WITH  SUPPER  . [DISCONTINUED] amLODipine (NORVASC) 2.5 MG tablet Take 1 tablet by mouth once daily  . [DISCONTINUED] atorvastatin (LIPITOR) 40 MG tablet Take 1 tablet by mouth once daily  . [DISCONTINUED] lisinopril (ZESTRIL) 20 MG tablet Take 1 tablet (20 mg total) by mouth daily. Please make yearly appt with Dr. Marlou Porch for December for future refills. Thank you 1st attempt     Allergies:   Hydrocodone   Social History   Tobacco Use  . Smoking status: Never Smoker  . Smokeless tobacco: Never Used  Vaping Use  . Vaping Use: Never used  Substance Use Topics  . Alcohol use: No  . Drug use: No     Family Hx: The patient's family history includes ALS in his  mother; Other in his father. There is no history of Cancer.  ROS:   Please see the history of present illness.    General:no colds or fevers, no weight changes Skin:no rashes or ulcers HEENT:no blurred vision, no congestion CV:see HPI PUL:see HPI GI:no diarrhea constipation or melena, no indigestion GU:no hematuria, no dysuria MS:no joint pain, no claudication Neuro:no syncope, no lightheadedness Endo:no diabetes, no thyroid disease  All other systems reviewed and are negative.   Prior CV studies:   The following studies were reviewed today:  See TEE above and cardiac cath above  Labs/Other Tests and Data Reviewed:    EKG:  An ECG dated 08/09/20 was personally reviewed today and demonstrated:  SB at 44 no acute ST  changes  stable  Recent Labs: No results found for requested labs within last 8760 hours.   Recent Lipid Panel Lab Results  Component Value Date/Time   CHOL 101 08/12/2017 02:33 AM   TRIG 90 08/12/2017 02:33 AM   HDL 38 (L) 08/12/2017 02:33 AM   CHOLHDL 2.7 08/12/2017 02:33 AM   LDLCALC 45 08/12/2017 02:33 AM    Wt Readings from Last 3 Encounters:  10/23/20 190 lb (86.2 kg)  08/09/20 194 lb 12.8 oz (88.4 kg)  10/21/19 197 lb (89.4 kg)     Risk Assessment/Calculations:     CHA2DS2-VASc Score = 6  This indicates a 9.7% annual risk of stroke. The patient's score is based upon: CHF History: 0 HTN History: 1 Diabetes History: 0 Stroke History: 2 Vascular Disease History: 1 Age Score: 2 Gender Score: 0     Objective:    Vital Signs:  Ht 5\' 11"  (1.803 m)   Wt 190 lb (86.2 kg)   BMI 26.50 kg/m    VITAL SIGNS:  reviewed pt to call us back with results General NAD, strong voice Neuro A&O X  3 Answers questions approp,  Pulmonary:  Can speak in complete sentences without SOB Psych: pleasant affect   ASSESSMENT & PLAN:    1. CAD with no further chest pain, hx of NSTEMI I 2013 with PCI to pLAD and moderate stenosis of RCA and LCX.  On ASA  statin, no BB due to significant Bradycardia 2. HLD followed by PCP in 2020 well controlled 3. CM with MI  In 2013 with EF 35% on last echo EF now 45-50% on ACE  4. PAF found after stroke and now on Xarelto no bleeding.  5.  HTN on amlodipine and lisinopril  6. PFO on xarelto   Shared Decision Making/Informed Consent        COVID-19 Education: The signs and symptoms of COVID-19 were discussed with the patient and how to seek care for testing (follow up with PCP or arrange E-visit).  The importance of social distancing was discussed today.  Time:   Today, I have spent 5 minutes with the patient with telehealth technology discussing the above problems.     Medication Adjustments/Labs and Tests Ordered: Current medicines are reviewed at length with the patient today.  Concerns regarding medicines are outlined above.   Tests Ordered: No orders of the defined types were placed in this encounter.   Medication Changes: Meds ordered this encounter  Medications  . lisinopril (ZESTRIL) 20 MG tablet    Sig: Take 1 tablet (20 mg total) by mouth daily.    Dispense:  90 tablet    Refill:  3  . amLODipine (NORVASC) 2.5 MG tablet    Sig: Take 1 tablet (2.5 mg total) by mouth daily.    Dispense:  90 tablet    Refill:  3  . atorvastatin (LIPITOR) 40 MG tablet    Sig: Take 1 tablet (40 mg total) by mouth daily.    Dispense:  90 tablet    Refill:  3    Follow Up:  In Person in 1 year(s) With Dr. Marlou Porch, follow up with Dr. Lovena Le per his direction.   Also followup PRN for weakness lightheadedness or chest   pain  Signed, Cecilie Kicks, NP  10/23/2020 9:09 AM    [place]

## 2020-10-23 ENCOUNTER — Telehealth (INDEPENDENT_AMBULATORY_CARE_PROVIDER_SITE_OTHER): Payer: PPO | Admitting: Cardiology

## 2020-10-23 ENCOUNTER — Encounter: Payer: Self-pay | Admitting: Cardiology

## 2020-10-23 ENCOUNTER — Other Ambulatory Visit: Payer: Self-pay

## 2020-10-23 ENCOUNTER — Telehealth: Payer: Self-pay | Admitting: *Deleted

## 2020-10-23 VITALS — Ht 71.0 in | Wt 190.0 lb

## 2020-10-23 DIAGNOSIS — I2510 Atherosclerotic heart disease of native coronary artery without angina pectoris: Secondary | ICD-10-CM

## 2020-10-23 DIAGNOSIS — Q2112 Patent foramen ovale: Secondary | ICD-10-CM

## 2020-10-23 DIAGNOSIS — R001 Bradycardia, unspecified: Secondary | ICD-10-CM

## 2020-10-23 DIAGNOSIS — I10 Essential (primary) hypertension: Secondary | ICD-10-CM

## 2020-10-23 DIAGNOSIS — I639 Cerebral infarction, unspecified: Secondary | ICD-10-CM

## 2020-10-23 DIAGNOSIS — I255 Ischemic cardiomyopathy: Secondary | ICD-10-CM | POA: Diagnosis not present

## 2020-10-23 DIAGNOSIS — I480 Paroxysmal atrial fibrillation: Secondary | ICD-10-CM

## 2020-10-23 DIAGNOSIS — E785 Hyperlipidemia, unspecified: Secondary | ICD-10-CM | POA: Diagnosis not present

## 2020-10-23 DIAGNOSIS — Q211 Atrial septal defect: Secondary | ICD-10-CM

## 2020-10-23 MED ORDER — LISINOPRIL 20 MG PO TABS
20.0000 mg | ORAL_TABLET | Freq: Every day | ORAL | 3 refills | Status: DC
Start: 2020-10-23 — End: 2021-12-05

## 2020-10-23 MED ORDER — AMLODIPINE BESYLATE 2.5 MG PO TABS
2.5000 mg | ORAL_TABLET | Freq: Every day | ORAL | 3 refills | Status: DC
Start: 2020-10-23 — End: 2021-12-05

## 2020-10-23 MED ORDER — ATORVASTATIN CALCIUM 40 MG PO TABS
40.0000 mg | ORAL_TABLET | Freq: Every day | ORAL | 3 refills | Status: DC
Start: 2020-10-23 — End: 2021-11-22

## 2020-10-23 NOTE — Patient Instructions (Signed)
Medication Instructions:  Your physician recommends that you continue on your current medications as directed. Please refer to the Current Medication list given to you today.  *If you need a refill on your cardiac medications before your next appointment, please call your pharmacy*   Lab Work: None ordered  If you have labs (blood work) drawn today and your tests are completely normal, you will receive your results only by: Marland Kitchen MyChart Message (if you have MyChart) OR . A paper copy in the mail If you have any lab test that is abnormal or we need to change your treatment, we will call you to review the results.   Testing/Procedures: None ordered   Follow-Up: At [facility], you and your health needs are our priority.  As part of our continuing mission to provide you with exceptional heart care, we have created designated Provider Care Teams.  These Care Teams include your primary Cardiologist (physician) and Advanced Practice Providers (APPs -  Physician Assistants and Nurse Practitioners) who all work together to provide you with the care you need, when you need it.  We recommend signing up for the patient portal called "MyChart".  Sign up information is provided on this After Visit Summary.  MyChart is used to connect with patients for Virtual Visits (Telemedicine).  Patients are able to view lab/test results, encounter notes, upcoming appointments, etc.  Non-urgent messages can be sent to your provider as well.   To learn more about what you can do with MyChart, go to [email or web address].    Your next appointment:   12 month(s)  The format for your next appointment:   In Person  Provider:   You may see Candee Furbish, MD or one of the following Advanced Practice Providers on your designated Care Team:    Truitt Merle, NP  Cecilie Kicks, NP  Kathyrn Drown, NP    Other Instructions Please have your blood pressure and heart rate checked and call with that information.

## 2020-10-23 NOTE — Telephone Encounter (Signed)
  Patient Consent for Virtual Visit         Samuel Walls has provided verbal consent on 10/23/2020 for a virtual visit (video or telephone).   CONSENT FOR VIRTUAL VISIT FOR:  Samuel Walls  By participating in this virtual visit I agree to the following:  I hereby voluntarily request, consent and authorize [facility] and its employed or contracted physicians, physician assistants, nurse practitioners or other licensed health care professionals (the Practitioner), to provide me with telemedicine health care services (the "Services") as deemed necessary by the treating Practitioner. I acknowledge and consent to receive the Services by the Practitioner via telemedicine. I understand that the telemedicine visit will involve communicating with the Practitioner through live audiovisual communication technology and the disclosure of certain medical information by electronic transmission. I acknowledge that I have been given the opportunity to request an in-person assessment or other available alternative prior to the telemedicine visit and am voluntarily participating in the telemedicine visit.  I understand that I have the right to withhold or withdraw my consent to the use of telemedicine in the course of my care at any time, without affecting my right to future care or treatment, and that the Practitioner or I may terminate the telemedicine visit at any time. I understand that I have the right to inspect all information obtained and/or recorded in the course of the telemedicine visit and may receive copies of available information for a reasonable fee.  I understand that some of the potential risks of receiving the Services via telemedicine include:  Marland Kitchen Delay or interruption in medical evaluation due to technological equipment failure or disruption; . Information transmitted may not be sufficient (e.g. poor resolution of images) to allow for appropriate medical decision making by the Practitioner;  and/or  . In rare instances, security protocols could fail, causing a breach of personal health information.  Furthermore, I acknowledge that it is my responsibility to provide information about my medical history, conditions and care that is complete and accurate to the best of my ability. I acknowledge that Practitioner's advice, recommendations, and/or decision may be based on factors not within their control, such as incomplete or inaccurate data provided by me or distortions of diagnostic images or specimens that may result from electronic transmissions. I understand that the practice of medicine is not an exact science and that Practitioner makes no warranties or guarantees regarding treatment outcomes. I acknowledge that a copy of this consent can be made available to me via my patient portal ([place]), or I can request a printed copy by calling the office of [facility].    I understand that my insurance will be billed for this visit.   I have read or had this consent read to me. . I understand the contents of this consent, which adequately explains the benefits and risks of the Services being provided via telemedicine.  . I have been provided ample opportunity to ask questions regarding this consent and the Services and have had my questions answered to my satisfaction. . I give my informed consent for the services to be provided through the use of telemedicine in my medical care

## 2020-10-26 ENCOUNTER — Ambulatory Visit (INDEPENDENT_AMBULATORY_CARE_PROVIDER_SITE_OTHER): Payer: PPO

## 2020-10-26 DIAGNOSIS — I6340 Cerebral infarction due to embolism of unspecified cerebral artery: Secondary | ICD-10-CM | POA: Diagnosis not present

## 2020-10-28 LAB — CUP PACEART REMOTE DEVICE CHECK
Date Time Interrogation Session: 20211210230852
Implantable Pulse Generator Implant Date: 20180926

## 2020-11-08 ENCOUNTER — Other Ambulatory Visit: Payer: Self-pay | Admitting: Cardiology

## 2020-11-08 NOTE — Progress Notes (Signed)
Carelink Summary Report / Loop Recorder 

## 2020-11-30 ENCOUNTER — Ambulatory Visit (INDEPENDENT_AMBULATORY_CARE_PROVIDER_SITE_OTHER): Payer: PPO

## 2020-11-30 DIAGNOSIS — I6340 Cerebral infarction due to embolism of unspecified cerebral artery: Secondary | ICD-10-CM

## 2020-11-30 LAB — CUP PACEART REMOTE DEVICE CHECK
Date Time Interrogation Session: 20220112234847
Implantable Pulse Generator Implant Date: 20180926

## 2020-12-14 NOTE — Progress Notes (Signed)
Carelink Summary Report / Loop Recorder 

## 2021-01-01 ENCOUNTER — Ambulatory Visit (INDEPENDENT_AMBULATORY_CARE_PROVIDER_SITE_OTHER): Payer: PPO

## 2021-01-01 DIAGNOSIS — I639 Cerebral infarction, unspecified: Secondary | ICD-10-CM | POA: Diagnosis not present

## 2021-01-02 LAB — CUP PACEART REMOTE DEVICE CHECK
Date Time Interrogation Session: 20220214235122
Implantable Pulse Generator Implant Date: 20180926

## 2021-01-08 NOTE — Progress Notes (Signed)
Carelink Summary Report / Loop Recorder 

## 2021-01-23 DIAGNOSIS — I129 Hypertensive chronic kidney disease with stage 1 through stage 4 chronic kidney disease, or unspecified chronic kidney disease: Secondary | ICD-10-CM | POA: Diagnosis not present

## 2021-01-23 DIAGNOSIS — I6990 Unspecified sequelae of unspecified cerebrovascular disease: Secondary | ICD-10-CM | POA: Diagnosis not present

## 2021-01-23 DIAGNOSIS — E785 Hyperlipidemia, unspecified: Secondary | ICD-10-CM | POA: Diagnosis not present

## 2021-01-23 DIAGNOSIS — I480 Paroxysmal atrial fibrillation: Secondary | ICD-10-CM | POA: Diagnosis not present

## 2021-01-23 DIAGNOSIS — Z9861 Coronary angioplasty status: Secondary | ICD-10-CM | POA: Diagnosis not present

## 2021-01-23 DIAGNOSIS — I2510 Atherosclerotic heart disease of native coronary artery without angina pectoris: Secondary | ICD-10-CM | POA: Diagnosis not present

## 2021-01-23 DIAGNOSIS — Z6827 Body mass index (BMI) 27.0-27.9, adult: Secondary | ICD-10-CM | POA: Diagnosis not present

## 2021-01-23 DIAGNOSIS — R7301 Impaired fasting glucose: Secondary | ICD-10-CM | POA: Diagnosis not present

## 2021-01-23 DIAGNOSIS — N183 Chronic kidney disease, stage 3 unspecified: Secondary | ICD-10-CM | POA: Diagnosis not present

## 2021-02-01 ENCOUNTER — Ambulatory Visit (INDEPENDENT_AMBULATORY_CARE_PROVIDER_SITE_OTHER): Payer: PPO

## 2021-02-01 DIAGNOSIS — I6340 Cerebral infarction due to embolism of unspecified cerebral artery: Secondary | ICD-10-CM | POA: Diagnosis not present

## 2021-02-05 LAB — CUP PACEART REMOTE DEVICE CHECK
Date Time Interrogation Session: 20220320005422
Implantable Pulse Generator Implant Date: 20180926

## 2021-02-06 ENCOUNTER — Encounter: Payer: Self-pay | Admitting: Internal Medicine

## 2021-02-06 ENCOUNTER — Other Ambulatory Visit: Payer: Self-pay

## 2021-02-06 ENCOUNTER — Ambulatory Visit (INDEPENDENT_AMBULATORY_CARE_PROVIDER_SITE_OTHER): Payer: PPO | Admitting: Internal Medicine

## 2021-02-06 VITALS — BP 164/72 | HR 41 | Ht 71.0 in | Wt 193.6 lb

## 2021-02-06 DIAGNOSIS — I2583 Coronary atherosclerosis due to lipid rich plaque: Secondary | ICD-10-CM

## 2021-02-06 DIAGNOSIS — R001 Bradycardia, unspecified: Secondary | ICD-10-CM | POA: Diagnosis not present

## 2021-02-06 DIAGNOSIS — I2510 Atherosclerotic heart disease of native coronary artery without angina pectoris: Secondary | ICD-10-CM

## 2021-02-06 DIAGNOSIS — I10 Essential (primary) hypertension: Secondary | ICD-10-CM

## 2021-02-06 DIAGNOSIS — I480 Paroxysmal atrial fibrillation: Secondary | ICD-10-CM | POA: Diagnosis not present

## 2021-02-06 NOTE — Progress Notes (Signed)
HPI Mr. Ressel returns today for followup. He is a pleasant 80 yo man with a h/o cryptogenic stroke, s/p ILR insertion. He was found to have atrial fib and has been on xarelto. His ILR has reached RRT and he presents to consider removal. In the interim, he notes  Allergies  Allergen Reactions  . Hydrocodone Other (See Comments)    vicodin --"makes me crazy"     Current Outpatient Medications  Medication Sig Dispense Refill  . amLODipine (NORVASC) 2.5 MG tablet Take 1 tablet (2.5 mg total) by mouth daily. 90 tablet 3  . atorvastatin (LIPITOR) 40 MG tablet Take 1 tablet (40 mg total) by mouth daily. 90 tablet 3  . lisinopril (ZESTRIL) 20 MG tablet Take 1 tablet (20 mg total) by mouth daily. 90 tablet 3  . Multiple Vitamin (MULTIVITAMIN WITH MINERALS) TABS Take 1 tablet by mouth every morning.     . nitroGLYCERIN (NITROSTAT) 0.4 MG SL tablet DISSOLVE ONE TABLET UNDER THE TONGUE EVERY 5 MINUTES AS NEEDED FOR CHEST PAIN.  DO NOT EXCEED A TOTAL OF 3 DOSES IN 15 MINUTES 25 tablet 5  . omega-3 acid ethyl esters (LOVAZA) 1 G capsule Take 2 g by mouth every evening.     . tadalafil (CIALIS) 20 MG tablet Take 20 mg by mouth daily as needed for erectile dysfunction.    Alveda Reasons 20 MG TABS tablet TAKE 1 TABLET BY MOUTH ONCE DAILY WITH  SUPPER 90 tablet 1   No current facility-administered medications for this visit.     Past Medical History:  Diagnosis Date  . BPH (benign prostatic hyperplasia)   . CAD (coronary artery disease) cardiologist-  dr Joellyn Quails   a.  NSTEMI  05-31-2012 s/p  DES to pLAD (troponin >20).  . Chronic sinus bradycardia   . History of non-ST elevation myocardial infarction (NSTEMI)    07/ 2013   s/p  DES to LAD  . Hyperlipidemia LDL goal < 70   . Ischemic cardiomyopathy    a. 05/2012 per cardiac cath EF 35%. b. Subsequent echoes have shown EF improved to 45-50%.  . Nocturia   . PFO (patent foramen ovale)   . Prostate cancer [facility]) urologist-  dr chao/   oncologist-  dr Tammi Klippel   T1c, Gleason 7,  PSA 4.9  . S/P drug eluting coronary stent placement    05-31-2012   DES x1  to pLAD  . Stroke (cerebrum) ([place]) 07/2017  . Wears glasses     ROS:   All systems reviewed and negative except as noted in the HPI.   Past Surgical History:  Procedure Laterality Date  . ANKLE CLOSED REDUCTION Left 2000  . LEFT HEART CATHETERIZATION WITH CORONARY ANGIOGRAM N/A 05/31/2012   Procedure: LEFT HEART CATHETERIZATION WITH CORONARY ANGIOGRAM;  Surgeon: Sherren Mocha, MD;  Location: [facility] CATH LAB;  Service: Cardiovascular;  Laterality: N/A;  PCI w/ DES x1 to pLAD (95%);  moderate stenoses of RCA and CLx;  moderate severe segmental LV dysfunction, ef 35%  . LOOP RECORDER INSERTION N/A 08/13/2017   Procedure: LOOP RECORDER INSERTION;  Surgeon: Evans Lance, MD;  Location: [facility] CV LAB;  Service: Cardiovascular;  Laterality: N/A;  . PROSTATE BIOPSY    . RADIOACTIVE SEED IMPLANT N/A 01/02/2017   Procedure: RADIOACTIVE SEED IMPLANT/BRACHYTHERAPY IMPLANT;  Surgeon: Joie Bimler, MD;  Location[facility];  Service: Urology;  Laterality: N/A;  . TEE WITHOUT CARDIOVERSION N/A 08/13/2017   Procedure: TRANSESOPHAGEAL ECHOCARDIOGRAM (  TEE);  Surgeon: Pixie Casino, MD;  Location: [facility] ENDOSCOPY;  Service: Cardiovascular;  Laterality: N/A;  . TRANSTHORACIC ECHOCARDIOGRAM  11/29/2013   midl focal basal LVH of the septum,  EF 50%, akinesis of the mid-distalanteroseptal and apical myocardium,  grade 1 diastolic dysfunction/  mild LAE/  mild dilated aortic root/  trivial MR and TR     Family History  Problem Relation Age of Onset  . ALS Mother   . Other Father   . Cancer Neg Hx      Social History   Socioeconomic History  . Marital status: Married    Spouse name: Not on file  . Number of children: Not on file  . Years of education: Not on file  . Highest education level: Not on file  Occupational History  . Not on file  Tobacco Use  .  Smoking status: Never Smoker  . Smokeless tobacco: Never Used  Vaping Use  . Vaping Use: Never used  Substance and Sexual Activity  . Alcohol use: No  . Drug use: No  . Sexual activity: Not on file  Other Topics Concern  . Not on file  Social History Narrative  . Not on file   Social Determinants of Health   Financial Resource Strain: Not on file  Food Insecurity: Not on file  Transportation Needs: Not on file  Physical Activity: Not on file  Stress: Not on file  Social Connections: Not on file  Intimate Partner Violence: Not on file     There were no vitals taken for this visit.  Physical Exam:  Well appearing 80 yo man, NAD HEENT: Unremarkable Neck:  6 cm JVD, no thyromegally Lymphatics:  No adenopathy Back:  No CVA tenderness Lungs:  Clear with no wheezes HEART:  Regular rate rhythm, no murmurs, no rubs, no clicks Abd:  soft, positive bowel sounds, no organomegally, no rebound, no guarding Ext:  2 plus pulses, no edema, no cyanosis, no clubbing Skin:  No rashes no nodules Neuro:  CN II through XII intact, motor grossly intact  DEVICE  Normal device function.  See PaceArt for details.   EP Procedure Note  Procedure performed: ILR removal  Preoperative diagnosis: ILR at RRT Postoperative diagnosis: same as preoperative diagnosis:  Description of the procedure: after informed consent was obtained, the patient was prepped and draped in a sterile fashion. 10 cc of lidocaine was infiltrated into the left pectoral region. A one cm stab incision was carried out. A combination of blunt and sharp dissection was utilized to dissect down to the ILR. The device was grasped and removed with gentle traction. Benzoin and steristrips were painted on the skin and the patient was recovered in the usual manner.  Assess/Plan: 1. Cryptogenic stroke - the patient has been found to have PAF. 2. PAF - he is asymptomatic and is tolerating his xarelto. 3. ILR -his device has reached  RRT and he presents today and has undegone successful device removal. 4. CAD - he denies anginal symptoms.   Carleene Overlie Taylor,MD

## 2021-02-06 NOTE — Patient Instructions (Signed)
Medication Instructions:  Your physician recommends that you continue on your current medications as directed. Please refer to the Current Medication list given to you today.  Labwork: None ordered.  Testing/Procedures: None ordered.  Follow-Up:  Your physician wants you to follow-up as needed with Dr. Lovena Le.   Implantable Loop Recorder Removal, Care After This sheet gives you information about how to care for yourself after your procedure. Your health care provider may also give you more specific instructions. If you have problems or questions, contact your health care provider. What can I expect after the procedure? After the procedure, it is common to have:  Soreness or discomfort near the incision.  Some swelling or bruising near the incision.  Follow these instructions at home: Incision care  1.  Leave your outer dressing on for 72 hours.  After 72 hours you can remove your outer dressing and shower. 2. Leave adhesive strips in place. These skin closures may need to stay in place for 1-2 weeks. If adhesive strip edges start to loosen and curl up, you may trim the loose edges.  You may remove the strips if they have not fallen off after 2 weeks. 3. Check your incision area every day for signs of infection. Check for: a. Redness, swelling, or pain. b. Fluid or blood. c. Warmth. d. Pus or a bad smell. 4. Do not take baths, swim, or use a hot tub until your incision is completely healed. 5. If your wound site starts to bleed apply pressure.      If you have any questions/concerns please call the device clinic at [phone].  Activity  Return to your normal activities.  Contact a health care provider if:  You have redness, swelling, or pain around your incision.  You have a fever.

## 2021-02-09 NOTE — Progress Notes (Signed)
Carelink Summary Report / Loop Recorder 

## 2021-03-05 ENCOUNTER — Ambulatory Visit (INDEPENDENT_AMBULATORY_CARE_PROVIDER_SITE_OTHER): Payer: PPO

## 2021-03-05 DIAGNOSIS — I6340 Cerebral infarction due to embolism of unspecified cerebral artery: Secondary | ICD-10-CM | POA: Diagnosis not present

## 2021-03-09 LAB — CUP PACEART REMOTE DEVICE CHECK
Date Time Interrogation Session: 20220422005419
Implantable Pulse Generator Implant Date: 20180926

## 2021-03-22 NOTE — Progress Notes (Signed)
Carelink Summary Report / Loop Recorder 

## 2021-04-18 DIAGNOSIS — Z139 Encounter for screening, unspecified: Secondary | ICD-10-CM | POA: Diagnosis not present

## 2021-04-18 DIAGNOSIS — Z0000 Encounter for general adult medical examination without abnormal findings: Secondary | ICD-10-CM | POA: Diagnosis not present

## 2021-04-18 DIAGNOSIS — Z9181 History of falling: Secondary | ICD-10-CM | POA: Diagnosis not present

## 2021-04-18 DIAGNOSIS — Z1331 Encounter for screening for depression: Secondary | ICD-10-CM | POA: Diagnosis not present

## 2021-04-18 DIAGNOSIS — E785 Hyperlipidemia, unspecified: Secondary | ICD-10-CM | POA: Diagnosis not present

## 2021-04-26 ENCOUNTER — Other Ambulatory Visit: Payer: Self-pay | Admitting: Cardiology

## 2021-04-26 NOTE — Telephone Encounter (Signed)
Prescription refill request for Xarelto received.  Indication: cva, afib Last office visit: Lovena Le 02/06/2021 Weight: 87.8 kg Age: 80 yo  Scr: 1.27, 01/23/2021 CrCl: 58 ml/min   Pt is on the correct dose of Xarelto per dosing criteria, prescription refill sent for Xarelto 20mg  daily.

## 2021-05-16 ENCOUNTER — Other Ambulatory Visit: Payer: Self-pay

## 2021-06-12 DIAGNOSIS — D485 Neoplasm of uncertain behavior of skin: Secondary | ICD-10-CM | POA: Diagnosis not present

## 2021-06-14 DIAGNOSIS — H43393 Other vitreous opacities, bilateral: Secondary | ICD-10-CM | POA: Diagnosis not present

## 2021-06-14 DIAGNOSIS — H524 Presbyopia: Secondary | ICD-10-CM | POA: Diagnosis not present

## 2021-06-14 DIAGNOSIS — H40033 Anatomical narrow angle, bilateral: Secondary | ICD-10-CM | POA: Diagnosis not present

## 2021-06-14 DIAGNOSIS — H2513 Age-related nuclear cataract, bilateral: Secondary | ICD-10-CM | POA: Diagnosis not present

## 2021-06-19 DIAGNOSIS — D0421 Carcinoma in situ of skin of right ear and external auricular canal: Secondary | ICD-10-CM | POA: Diagnosis not present

## 2021-07-26 DIAGNOSIS — I2510 Atherosclerotic heart disease of native coronary artery without angina pectoris: Secondary | ICD-10-CM | POA: Diagnosis not present

## 2021-07-26 DIAGNOSIS — E785 Hyperlipidemia, unspecified: Secondary | ICD-10-CM | POA: Diagnosis not present

## 2021-07-26 DIAGNOSIS — Z9861 Coronary angioplasty status: Secondary | ICD-10-CM | POA: Diagnosis not present

## 2021-07-26 DIAGNOSIS — R7301 Impaired fasting glucose: Secondary | ICD-10-CM | POA: Diagnosis not present

## 2021-07-26 DIAGNOSIS — N183 Chronic kidney disease, stage 3 unspecified: Secondary | ICD-10-CM | POA: Diagnosis not present

## 2021-07-26 DIAGNOSIS — Z8546 Personal history of malignant neoplasm of prostate: Secondary | ICD-10-CM | POA: Diagnosis not present

## 2021-07-26 DIAGNOSIS — I129 Hypertensive chronic kidney disease with stage 1 through stage 4 chronic kidney disease, or unspecified chronic kidney disease: Secondary | ICD-10-CM | POA: Diagnosis not present

## 2021-11-13 ENCOUNTER — Other Ambulatory Visit: Payer: Self-pay | Admitting: Cardiology

## 2021-11-13 DIAGNOSIS — I480 Paroxysmal atrial fibrillation: Secondary | ICD-10-CM

## 2021-11-14 NOTE — Telephone Encounter (Signed)
Xarelto 20mg  refill request received. Pt is 80 years old, weight-87.8kg, Crea-1.38 on 07/26/2021 via [facility] from [facility], last seen by Dr. Lovena Le on 02/06/2021, Diagnosis-Afib, CVA, CrCl-53.67ml/min; Dose is appropriate based on dosing criteria. Will send in refill to requested pharmacy.

## 2021-11-22 ENCOUNTER — Other Ambulatory Visit: Payer: Self-pay | Admitting: Cardiology

## 2021-12-03 ENCOUNTER — Other Ambulatory Visit: Payer: Self-pay | Admitting: Cardiology

## 2021-12-05 ENCOUNTER — Other Ambulatory Visit: Payer: Self-pay

## 2021-12-05 MED ORDER — LISINOPRIL 20 MG PO TABS
20.0000 mg | ORAL_TABLET | Freq: Every day | ORAL | 0 refills | Status: DC
Start: 2021-12-05 — End: 2022-01-16

## 2021-12-05 MED ORDER — AMLODIPINE BESYLATE 2.5 MG PO TABS: 2.5000 mg | ORAL_TABLET | Freq: Every day | ORAL | 0 refills | Status: DC

## 2021-12-05 NOTE — Telephone Encounter (Signed)
Pt's medications were sent to pt's pharmacy as requested. Confirmation received.  

## 2022-01-10 ENCOUNTER — Telehealth: Payer: Self-pay | Admitting: Cardiology

## 2022-01-10 NOTE — Telephone Encounter (Signed)
Pt returning phone call... please advise  

## 2022-01-10 NOTE — Telephone Encounter (Signed)
Pt states he saw our phone number on his caller ID.  No one left a message and he doesn't know who called him or why.  After reviewing the chart I am unable to determine who called pt.  There is no documentation of anyone calling him.  He will await a return call.

## 2022-01-16 ENCOUNTER — Other Ambulatory Visit: Payer: Self-pay | Admitting: Cardiology

## 2022-01-17 ENCOUNTER — Other Ambulatory Visit: Payer: Self-pay | Admitting: Cardiology

## 2022-01-23 DIAGNOSIS — I129 Hypertensive chronic kidney disease with stage 1 through stage 4 chronic kidney disease, or unspecified chronic kidney disease: Secondary | ICD-10-CM | POA: Diagnosis not present

## 2022-01-23 DIAGNOSIS — N183 Chronic kidney disease, stage 3 unspecified: Secondary | ICD-10-CM | POA: Diagnosis not present

## 2022-01-23 DIAGNOSIS — I2510 Atherosclerotic heart disease of native coronary artery without angina pectoris: Secondary | ICD-10-CM | POA: Diagnosis not present

## 2022-01-23 DIAGNOSIS — Z9861 Coronary angioplasty status: Secondary | ICD-10-CM | POA: Diagnosis not present

## 2022-01-23 DIAGNOSIS — E785 Hyperlipidemia, unspecified: Secondary | ICD-10-CM | POA: Diagnosis not present

## 2022-01-23 DIAGNOSIS — R7301 Impaired fasting glucose: Secondary | ICD-10-CM | POA: Diagnosis not present

## 2022-01-23 DIAGNOSIS — Z8546 Personal history of malignant neoplasm of prostate: Secondary | ICD-10-CM | POA: Diagnosis not present

## 2022-02-20 ENCOUNTER — Other Ambulatory Visit: Payer: Self-pay | Admitting: Cardiology

## 2022-03-11 ENCOUNTER — Ambulatory Visit: Payer: PPO | Admitting: Cardiology

## 2022-03-11 ENCOUNTER — Encounter: Payer: Self-pay | Admitting: Cardiology

## 2022-03-11 DIAGNOSIS — I2583 Coronary atherosclerosis due to lipid rich plaque: Secondary | ICD-10-CM

## 2022-03-11 DIAGNOSIS — E785 Hyperlipidemia, unspecified: Secondary | ICD-10-CM

## 2022-03-11 DIAGNOSIS — I2510 Atherosclerotic heart disease of native coronary artery without angina pectoris: Secondary | ICD-10-CM

## 2022-03-11 DIAGNOSIS — Z8673 Personal history of transient ischemic attack (TIA), and cerebral infarction without residual deficits: Secondary | ICD-10-CM | POA: Diagnosis not present

## 2022-03-11 DIAGNOSIS — Q2112 Patent foramen ovale: Secondary | ICD-10-CM | POA: Diagnosis not present

## 2022-03-11 DIAGNOSIS — I480 Paroxysmal atrial fibrillation: Secondary | ICD-10-CM | POA: Diagnosis not present

## 2022-03-11 NOTE — Assessment & Plan Note (Signed)
Picked up from prior TEE.  Currently on anticoagulation. ?

## 2022-03-11 NOTE — Assessment & Plan Note (Addendum)
Interestingly, today he is in atrial fibrillation with heart rate of 63 bpm.  Stable without any symptoms.  Did not realize he was in it.  He is not having any shortness of breath.  He is very active.  Lots of chores.  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  Subsequently this was removed in 2022 by Dr. Lovena Le.  He is on Xarelto and followed by Dr. Lovena Le.  Prior hemoglobin reviewed from outside labs 13.5 and creatinine of 1.2.  Previously on loop recorder 99% of the time he is in normal sinus rhythm.  No need for antiarrhythmic therapy.  We will continue with current therapy.  Knows the importance of Xarelto.  Cost can be an issue at times.  I did mention watchman to him.  Not interested. ?

## 2022-03-11 NOTE — Progress Notes (Signed)
?Cardiology Office Note:   ? ?Date:  03/11/2022  ? ?ID:  Samuel Walls, DOB 1941-09-08, MRN [id number] ? ?PCP:  Nicoletta Dress, MD ?  ?[facility] Providers ?Cardiologist:  Candee Furbish, MD ?Electrophysiologist:  Cristopher Peru, MD    ? ?Referring MD: Nicoletta Dress, MD  ? ? ?History of Present Illness:   ? ?Samuel Walls is a 81 y.o. male with prior cryptogenic stroke, implantable loop recorder (2018 successful removal in 2022), atrial fibrillation followed by Dr. Lovena Le on Xarelto asymptomatic here for follow-up.  I had last seen him in December 2020.  He has been following Dr. Lovena Le since. ? ?Also has ischemic cardiomyopathy diagnosed in 2013 with cardiac catheterization showing EF of 35%, status post DES to LAD in the setting of non-ST elevation myocardial infarction. ? ?Has had chronic bradycardia, prostate cancer, PFO ? ?Past Medical History:  ?Diagnosis Date  ? BPH (benign prostatic hyperplasia)   ? CAD (coronary artery disease) cardiologist-  dr Marlou Porch Ron Parker  ? a.  NSTEMI  05-31-2012 s/p  DES to pLAD (troponin >20).  ? Chronic sinus bradycardia   ? History of non-ST elevation myocardial infarction (NSTEMI)   ? 07/ 2013   s/p  DES to LAD  ? Hyperlipidemia LDL goal < 70   ? Ischemic cardiomyopathy   ? a. 05/2012 per cardiac cath EF 35%. b. Subsequent echoes have shown EF improved to 45-50%.  ? Nocturia   ? PFO (patent foramen ovale)   ? Prostate cancer [facility]) urologist-  dr chao/  oncologist-  dr Tammi Klippel  ? T1c, Gleason 7,  PSA 4.9  ? S/P drug eluting coronary stent placement   ? 05-31-2012   DES x1  to pLAD  ? Stroke (cerebrum) ([facility]) 07/2017  ? Wears glasses   ? ? ?Past Surgical History:  ?Procedure Laterality Date  ? ANKLE CLOSED REDUCTION Left 2000  ? LEFT HEART CATHETERIZATION WITH CORONARY ANGIOGRAM N/A 05/31/2012  ? Procedure: LEFT HEART CATHETERIZATION WITH CORONARY ANGIOGRAM;  Surgeon: Sherren Mocha, MD;  Location: [facility] CATH LAB;  Service: Cardiovascular;  Laterality: N/A;  PCI w/ DES x1 to pLAD  (95%);  moderate stenoses of RCA and CLx;  moderate severe segmental LV dysfunction, ef 35%  ? LOOP RECORDER INSERTION N/A 08/13/2017  ? Procedure: LOOP RECORDER INSERTION;  Surgeon: Evans Lance, MD;  Location: [facility] CV LAB;  Service: Cardiovascular;  Laterality: N/A;  ? PROSTATE BIOPSY    ? RADIOACTIVE SEED IMPLANT N/A 01/02/2017  ? Procedure: RADIOACTIVE SEED IMPLANT/BRACHYTHERAPY IMPLANT;  Surgeon: Joie Bimler, MD;  Location: [facility];  Service: Urology;  Laterality: N/A;  ? TEE WITHOUT CARDIOVERSION N/A 08/13/2017  ? Procedure: TRANSESOPHAGEAL ECHOCARDIOGRAM (TEE);  Surgeon: Pixie Casino, MD;  Location: [facility] ENDOSCOPY;  Service: Cardiovascular;  Laterality: N/A;  ? TRANSTHORACIC ECHOCARDIOGRAM  11/29/2013  ? midl focal basal LVH of the septum,  EF 50%, akinesis of the mid-distalanteroseptal and apical myocardium,  grade 1 diastolic dysfunction/  mild LAE/  mild dilated aortic root/  trivial MR and TR  ? ? ?Current Medications: ?Current Meds  ?Medication Sig  ? amLODipine (NORVASC) 2.5 MG tablet Take 1 tablet (2.5 mg total) by mouth daily.  ? atorvastatin (LIPITOR) 40 MG tablet Take 1 tablet by mouth once daily  ? lisinopril (ZESTRIL) 20 MG tablet Take 1 tablet by mouth once daily  ? Multiple Vitamin (MULTIVITAMIN WITH MINERALS) TABS Take 1 tablet by mouth every morning.   ? nitroGLYCERIN (NITROSTAT) 0.4 MG SL  tablet DISSOLVE ONE TABLET UNDER THE TONGUE EVERY 5 MINUTES AS NEEDED FOR CHEST PAIN.  DO NOT EXCEED A TOTAL OF 3 DOSES IN 15 MINUTES  ? omega-3 acid ethyl esters (LOVAZA) 1 G capsule Take 2 g by mouth every evening.   ? rivaroxaban (XARELTO) 20 MG TABS tablet TAKE 1 TABLET BY MOUTH ONCE DAILY WITH SUPPER  ? tadalafil (CIALIS) 20 MG tablet Take 20 mg by mouth daily as needed for erectile dysfunction.  ?  ? ?Allergies:   Hydrocodone  ? ?Social History  ? ?Socioeconomic History  ? Marital status: Married  ?  Spouse name: Not on file  ? Number of children: Not on file  ? Years of  education: Not on file  ? Highest education level: Not on file  ?Occupational History  ? Not on file  ?Tobacco Use  ? Smoking status: Never  ? Smokeless tobacco: Never  ?Vaping Use  ? Vaping Use: Never used  ?Substance and Sexual Activity  ? Alcohol use: No  ? Drug use: No  ? Sexual activity: Not on file  ?Other Topics Concern  ? Not on file  ?Social History Narrative  ? Not on file  ? ?Social Determinants of Health  ? ?Financial Resource Strain: Not on file  ?Food Insecurity: Not on file  ?Transportation Needs: Not on file  ?Physical Activity: Not on file  ?Stress: Not on file  ?Social Connections: Not on file  ?  ? ?Family History: ?The patient's family history includes ALS in his mother; Other in his father. There is no history of Cancer. ? ?ROS:   ?Please see the history of present illness.    ?No fevers chills nausea vomiting syncope all other systems reviewed and are negative. ? ?EKGs/Labs/Other Studies Reviewed:   ? ?The following studies were reviewed today: ?TEE 2018: ? ?- Left ventricle: There was mild concentric hypertrophy. Systolic  ?  function was mildly reduced. The estimated ejection fraction was  ?  in the range of 45% to 50%. Distal anteroapical hypokinesis.  ?- Mitral valve: No evidence of vegetation.  ?- Left atrium: The atrium was dilated. No evidence of thrombus in  ?  the atrial cavity or appendage.  ?- Right atrium: No evidence of thrombus in the atrial cavity or  ?  appendage.  ?- Atrial septum: Aneurysmal. There is left to right flow by color  ?  doppler suggestive of PFO. Right to left flow by saline  ?  microbubble contrast was seen intermittently without and with  ?  provocation.  ?- Pulmonic valve: No evidence of vegetation.  ? ?Impressions:  ? ?- PFO is present. ? ?EKG:  EKG is  ordered today.  The ekg ordered today demonstrates atrial fibrillation heart rate 63 bpm ? ?Recent Labs: ?No results found for requested labs within last 8760 hours.  ?Recent Lipid Panel ?   ?Component Value  Date/Time  ? CHOL 101 08/12/2017 0233  ? TRIG 90 08/12/2017 0233  ? HDL 38 (L) 08/12/2017 0233  ? CHOLHDL 2.7 08/12/2017 0233  ? VLDL 18 08/12/2017 0233  ? LDLCALC 45 08/12/2017 0233  ? ? ? ?Risk Assessment/Calculations:   ? ? ?    ? ?   ? ?Physical Exam:   ? ?VS:  BP 130/80 (BP Location: Left Arm, Patient Position: Sitting, Cuff Size: Normal)   Pulse 63   Ht '5\' 11"'$  (1.803 m)   Wt 184 lb (83.5 kg)   SpO2 99%   BMI 25.66 kg/m?    ? ?  Wt Readings from Last 3 Encounters:  ?03/11/22 184 lb (83.5 kg)  ?02/06/21 193 lb 9.6 oz (87.8 kg)  ?10/23/20 190 lb (86.2 kg)  ?  ? ?GEN:  Well nourished, well developed in no acute distress ?HEENT: Normal ?NECK: No JVD; No carotid bruits ?LYMPHATICS: No lymphadenopathy ?CARDIAC: Irreg no murmurs, no rubs, gallops ?RESPIRATORY:  Clear to auscultation without rales, wheezing or rhonchi  ?ABDOMEN: Soft, non-tender, non-distended ?MUSCULOSKELETAL:  No edema; No deformity  ?SKIN: Warm and dry ?NEUROLOGIC:  Alert and oriented x 3 ?PSYCHIATRIC:  Normal affect  ? ?ASSESSMENT:   ? ?1. Paroxysmal atrial fibrillation (HCC)   ?2. Coronary artery disease due to lipid rich plaque   ?3. PFO (patent foramen ovale)   ?4. Hyperlipidemia with target low density lipoprotein (LDL) cholesterol less than 70 mg/dL   ?5. History of stroke   ? ?PLAN:   ? ?In order of problems listed above: ? ?Paroxysmal atrial fibrillation (HCC) ?Interestingly, today he is in atrial fibrillation with heart rate of 63 bpm.  Stable without any symptoms.  Did not realize he was in it.  He is not having any shortness of breath.  He is very active.  Lots of chores.  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  Subsequently this was removed in 2022 by Dr. Lovena Le.  He is on Xarelto and followed by Dr. Lovena Le.  Prior hemoglobin reviewed from outside labs 13.5 and creatinine of 1.2.  Previously on loop recorder 99% of the time he is in normal sinus rhythm.  No need for  antiarrhythmic therapy.  We will continue with current therapy.  Knows the importance of Xarelto.  Cost can be an issue at times.  I did mention watchman to him.  Not interested. ? ?CAD (coronary artery disease

## 2022-03-11 NOTE — Assessment & Plan Note (Signed)
Currently on atorvastatin 40 mg.  LDL goal less than 70.  Actually with his stroke, prior MI could consider less than 55 target.  No myalgias. ?

## 2022-03-11 NOTE — Assessment & Plan Note (Signed)
2013 MI, proximal LAD was 95%.  Had a Xience DES placed to that region.  His EF was 35% at that time.  He also has moderate disease elsewhere.  Continue with goal-directed medical therapy.  High intensity statin, ACE inhibitor.  He is not on beta-blocker because of chronic bradycardia.  He is currently on Xarelto, not on aspirin. ?

## 2022-03-11 NOTE — Patient Instructions (Signed)
Medication Instructions:  ?The current medical regimen is effective;  continue present plan and medications. ? ?*If you need a refill on your cardiac medications before your next appointment, please call your pharmacy* ? ?Follow-Up: ?At [facility], you and your health needs are our priority.  As part of our continuing mission to provide you with exceptional heart care, we have created designated Provider Care Teams.  These Care Teams include your primary Cardiologist (physician) and Advanced Practice Providers (APPs -  Physician Assistants and Nurse Practitioners) who all work together to provide you with the care you need, when you need it. ? ?We recommend signing up for the patient portal called "MyChart".  Sign up information is provided on this After Visit Summary.  MyChart is used to connect with patients for Virtual Visits (Telemedicine).  Patients are able to view lab/test results, encounter notes, upcoming appointments, etc.  Non-urgent messages can be sent to your provider as well.   ?To learn more about what you can do with MyChart, go to [email or web address].   ? ?Your next appointment:   ?1 year(s) ? ?The format for your next appointment:   ?In Person ? ?Provider:   ?Candee Furbish, MD   ? ?Thank you for choosing [facility]!! ? ? ? ?Important Information About [employer] ? ? ? ? ?  ?

## 2022-03-11 NOTE — Assessment & Plan Note (Signed)
Occurred in 2018.  Felt to be cryptogenic at first.  Loop recorder placed.  Short episode of atrial fibrillation noted.  He was placed on Xarelto.  PFO noted on TEE 2018.  On Xarelto. ?

## 2022-03-14 ENCOUNTER — Other Ambulatory Visit: Payer: Self-pay | Admitting: Cardiology

## 2022-03-18 ENCOUNTER — Other Ambulatory Visit: Payer: Self-pay | Admitting: Cardiology

## 2022-05-18 ENCOUNTER — Other Ambulatory Visit: Payer: Self-pay | Admitting: Cardiology

## 2022-07-12 DIAGNOSIS — Z0000 Encounter for general adult medical examination without abnormal findings: Secondary | ICD-10-CM | POA: Diagnosis not present

## 2022-07-12 DIAGNOSIS — E785 Hyperlipidemia, unspecified: Secondary | ICD-10-CM | POA: Diagnosis not present

## 2022-07-12 DIAGNOSIS — Z1331 Encounter for screening for depression: Secondary | ICD-10-CM | POA: Diagnosis not present

## 2022-07-19 ENCOUNTER — Other Ambulatory Visit: Payer: Self-pay | Admitting: Cardiology

## 2022-07-19 DIAGNOSIS — I480 Paroxysmal atrial fibrillation: Secondary | ICD-10-CM

## 2022-07-19 NOTE — Telephone Encounter (Signed)
Prescription refill request for Xarelto received.  Indication:Afib  Last office visit:03/11/22 Marlou Porch)  Weight:83.5kg Age:81 Scr:1.19 (01/23/22)  CrCl: 58.65m/min  Appropriate dose and refill sent to requested pharmacy.

## 2022-07-26 DIAGNOSIS — E785 Hyperlipidemia, unspecified: Secondary | ICD-10-CM | POA: Diagnosis not present

## 2022-07-26 DIAGNOSIS — N183 Chronic kidney disease, stage 3 unspecified: Secondary | ICD-10-CM | POA: Diagnosis not present

## 2022-07-26 DIAGNOSIS — I2510 Atherosclerotic heart disease of native coronary artery without angina pectoris: Secondary | ICD-10-CM | POA: Diagnosis not present

## 2022-07-26 DIAGNOSIS — Z9861 Coronary angioplasty status: Secondary | ICD-10-CM | POA: Diagnosis not present

## 2022-07-26 DIAGNOSIS — I129 Hypertensive chronic kidney disease with stage 1 through stage 4 chronic kidney disease, or unspecified chronic kidney disease: Secondary | ICD-10-CM | POA: Diagnosis not present

## 2022-07-26 DIAGNOSIS — Z8546 Personal history of malignant neoplasm of prostate: Secondary | ICD-10-CM | POA: Diagnosis not present

## 2022-07-26 DIAGNOSIS — R7301 Impaired fasting glucose: Secondary | ICD-10-CM | POA: Diagnosis not present

## 2023-01-06 ENCOUNTER — Other Ambulatory Visit: Payer: Self-pay | Admitting: Cardiology

## 2023-01-06 DIAGNOSIS — I480 Paroxysmal atrial fibrillation: Secondary | ICD-10-CM

## 2023-01-06 NOTE — Telephone Encounter (Signed)
Xarelto 29m refill request received. Pt is 82years old, weight-83.5kg, Crea-1.19 on 01/24/22 via scanned labs from PCP, last seen by Dr. SMarlou Porchon 03/11/22, Diagnosis-Afib, CrCl-57.542mmin; Dose is appropriate based on dosing criteria. Will send in refill to requested pharmacy.

## 2023-01-24 DIAGNOSIS — N183 Chronic kidney disease, stage 3 unspecified: Secondary | ICD-10-CM | POA: Diagnosis not present

## 2023-01-24 DIAGNOSIS — I129 Hypertensive chronic kidney disease with stage 1 through stage 4 chronic kidney disease, or unspecified chronic kidney disease: Secondary | ICD-10-CM | POA: Diagnosis not present

## 2023-01-24 DIAGNOSIS — G8194 Hemiplegia, unspecified affecting left nondominant side: Secondary | ICD-10-CM | POA: Diagnosis not present

## 2023-01-24 DIAGNOSIS — E785 Hyperlipidemia, unspecified: Secondary | ICD-10-CM | POA: Diagnosis not present

## 2023-01-24 DIAGNOSIS — R7301 Impaired fasting glucose: Secondary | ICD-10-CM | POA: Diagnosis not present

## 2023-01-24 DIAGNOSIS — Z9861 Coronary angioplasty status: Secondary | ICD-10-CM | POA: Diagnosis not present

## 2023-01-24 DIAGNOSIS — Z8546 Personal history of malignant neoplasm of prostate: Secondary | ICD-10-CM | POA: Diagnosis not present

## 2023-01-24 DIAGNOSIS — I2510 Atherosclerotic heart disease of native coronary artery without angina pectoris: Secondary | ICD-10-CM | POA: Diagnosis not present

## 2023-01-24 DIAGNOSIS — R001 Bradycardia, unspecified: Secondary | ICD-10-CM | POA: Diagnosis not present

## 2023-01-24 DIAGNOSIS — I480 Paroxysmal atrial fibrillation: Secondary | ICD-10-CM | POA: Diagnosis not present

## 2023-02-26 ENCOUNTER — Telehealth: Payer: Self-pay

## 2023-02-26 DIAGNOSIS — H2513 Age-related nuclear cataract, bilateral: Secondary | ICD-10-CM | POA: Diagnosis not present

## 2023-02-26 DIAGNOSIS — H43393 Other vitreous opacities, bilateral: Secondary | ICD-10-CM | POA: Diagnosis not present

## 2023-02-26 DIAGNOSIS — H40033 Anatomical narrow angle, bilateral: Secondary | ICD-10-CM | POA: Diagnosis not present

## 2023-02-26 DIAGNOSIS — H524 Presbyopia: Secondary | ICD-10-CM | POA: Diagnosis not present

## 2023-02-26 NOTE — Patient Outreach (Signed)
  Care Coordination   Initial Visit Note   02/26/2023 Name: Samuel Walls MRN: [id number] DOB: 1941/07/04  Samuel Walls is a 82 y.o. year old male who sees Paulina Fusi, MD for primary care. I spoke with  Nelida Meuse by phone today.  What matters to the patients health and wellness today?  Placed call to patient to review and offer [facility] care coordination program. Patient declines and hangs up the phone.      SDOH assessments and interventions completed:  No     Care Coordination Interventions:  No, not indicated   Follow up plan: No further intervention required.   Encounter Outcome:  Pt. Refused   Rowe Pavy, RN, BSN, CEN [facility] [phone]

## 2023-03-27 ENCOUNTER — Other Ambulatory Visit: Payer: Self-pay | Admitting: Cardiology

## 2023-04-05 ENCOUNTER — Other Ambulatory Visit: Payer: Self-pay | Admitting: Cardiology

## 2023-04-05 DIAGNOSIS — I480 Paroxysmal atrial fibrillation: Secondary | ICD-10-CM

## 2023-04-07 ENCOUNTER — Other Ambulatory Visit: Payer: Self-pay

## 2023-04-07 MED ORDER — RIVAROXABAN 15 MG PO TABS: 15.0000 mg | ORAL_TABLET | Freq: Every day | ORAL | 5 refills | Status: DC

## 2023-04-07 MED ORDER — RIVAROXABAN 15 MG PO TABS: 15.0000 mg | ORAL_TABLET | Freq: Two times a day (BID) | ORAL | 5 refills | Status: DC

## 2023-04-07 NOTE — Telephone Encounter (Signed)
Prescription refill request for Xarelto received.  Indication:afib Last office visit:upcoming Weight:83.5  kg Age:82 Scr:1.5 CrCl:45.62  ml/min  Prescription under review

## 2023-04-16 ENCOUNTER — Ambulatory Visit: Payer: PPO | Attending: Cardiology | Admitting: Cardiology

## 2023-04-16 ENCOUNTER — Encounter: Payer: Self-pay | Admitting: Cardiology

## 2023-04-16 VITALS — BP 112/78 | HR 85 | Ht 71.0 in | Wt 189.0 lb

## 2023-04-16 DIAGNOSIS — I2510 Atherosclerotic heart disease of native coronary artery without angina pectoris: Secondary | ICD-10-CM

## 2023-04-16 DIAGNOSIS — I480 Paroxysmal atrial fibrillation: Secondary | ICD-10-CM

## 2023-04-16 DIAGNOSIS — Q2112 Patent foramen ovale: Secondary | ICD-10-CM | POA: Diagnosis not present

## 2023-04-16 DIAGNOSIS — I2583 Coronary atherosclerosis due to lipid rich plaque: Secondary | ICD-10-CM | POA: Diagnosis not present

## 2023-04-16 NOTE — Progress Notes (Signed)
Cardiology Office Note:    Date:  04/16/2023   ID:  Samuel Walls, DOB Sep 28, 1941, MRN [id number]  PCP:  Samuel Fusi, MD   Samuel [facility] Cardiologist:  Samuel Schultz, MD Electrophysiologist:  Samuel Bunting, MD     Referring MD: Samuel Fusi, MD    History of Present Illness:    Samuel Walls is a 82 y.o. male with prior cryptogenic stroke, implantable loop recorder (2018 successful removal in 2022), atrial fibrillation followed by Dr. Ladona Walls on Xarelto asymptomatic here for follow-up.   Also has ischemic cardiomyopathy diagnosed in 2013 with cardiac catheterization showing EF of 35%, status post DES to LAD in the setting of non-ST elevation myocardial infarction.   Has had chronic bradycardia, prostate cancer, PFO  Past Medical History:  Diagnosis Date   BPH (benign prostatic hyperplasia)    CAD (coronary artery disease) cardiologist-  dr Samuel Walls   a.  NSTEMI  05-31-2012 s/p  DES to pLAD (troponin >20).   Chronic sinus bradycardia    History of non-ST elevation myocardial infarction (NSTEMI)    07/ 2013   s/p  DES to LAD   Hyperlipidemia LDL goal < 70    Ischemic cardiomyopathy    a. 05/2012 per cardiac cath EF 35%. b. Subsequent echoes have shown EF improved to 45-50%.   Nocturia    PFO (patent foramen ovale)    Prostate cancer [facility]) urologist-  dr chao/  oncologist-  dr Kathrynn Running   T1c, Gleason 7,  PSA 4.9   S/P drug eluting coronary stent placement    05-31-2012   DES x1  to pLAD   Stroke (cerebrum) (HCC) 07/2017   Wears glasses     Past Surgical History:  Procedure Laterality Date   ANKLE CLOSED REDUCTION Left 2000   LEFT HEART CATHETERIZATION WITH CORONARY ANGIOGRAM N/A 05/31/2012   Procedure: LEFT HEART CATHETERIZATION WITH CORONARY ANGIOGRAM;  Surgeon: Samuel Bollman, MD;  [facility];  Service: Cardiovascular;  Laterality: N/A;  PCI w/ DES x1 to pLAD (95%);  moderate stenoses of RCA and CLx;  moderate severe segmental  LV dysfunction, ef 35%   LOOP RECORDER INSERTION N/A 08/13/2017   Procedure: LOOP RECORDER INSERTION;  Surgeon: Samuel Maw, MD;  Location: [facility];  Service: Cardiovascular;  Laterality: N/A;   PROSTATE BIOPSY     RADIOACTIVE SEED IMPLANT N/A 01/02/2017   Procedure: RADIOACTIVE SEED IMPLANT/BRACHYTHERAPY IMPLANT;  Surgeon: Samuel Baller, MD;  Location: [facility];  Service: Urology;  Laterality: N/A;   TEE WITHOUT CARDIOVERSION N/A 08/13/2017   Procedure: TRANSESOPHAGEAL ECHOCARDIOGRAM (TEE);  Surgeon: Samuel Nose, MD;  Location: [facility];  Service: Cardiovascular;  Laterality: N/A;   TRANSTHORACIC ECHOCARDIOGRAM  11/29/2013   midl focal basal LVH of the septum,  EF 50%, akinesis of the mid-distalanteroseptal and apical myocardium,  grade 1 diastolic dysfunction/  mild LAE/  mild dilated aortic root/  trivial MR and TR    Current Medications: Current Meds  Medication Sig   amLODipine (NORVASC) 2.5 MG tablet Take 1 tablet (2.5 mg total) by mouth daily.   atorvastatin (LIPITOR) 40 MG tablet Take 1 tablet by mouth once daily   lisinopril (ZESTRIL) 20 MG tablet Take 1 tablet (20 mg total) by mouth daily.   Multiple Vitamin (MULTIVITAMIN WITH MINERALS) TABS Take 1 tablet by mouth every morning.    nitroGLYCERIN (NITROSTAT) 0.4 MG SL tablet DISSOLVE ONE TABLET UNDER THE TONGUE EVERY 5 MINUTES AS NEEDED  FOR CHEST PAIN.  DO NOT EXCEED A TOTAL OF 3 DOSES IN 15 MINUTES   omega-3 acid ethyl esters (LOVAZA) 1 G capsule Take 2 g by mouth every evening.    Rivaroxaban (XARELTO) 15 MG TABS tablet Take 1 tablet (15 mg total) by mouth daily with supper.   tadalafil (CIALIS) 20 MG tablet Take 20 mg by mouth daily as needed for erectile dysfunction.     Allergies:   Hydrocodone   Social History   Socioeconomic History   Marital status: Married    Spouse name: Not on file   Number of children: Not on file   Years of education: Not on file   Highest education level:  Not on file  Occupational History   Not on file  Tobacco Use   Smoking status: Never   Smokeless tobacco: Never  Vaping Use   Vaping Use: Never used  Substance and Sexual Activity   Alcohol use: No   Drug use: No   Sexual activity: Not on file  Other Topics Concern   Not on file  Social History Narrative   Not on file   Social Determinants of Health   Financial Resource Strain: Not on file  Food Insecurity: Not on file  Transportation Needs: Not on file  Physical Activity: Not on file  Stress: Not on file  Social Connections: Not on file     Family History: The patient's family history includes ALS in his mother; Other in his father. There is no history of Cancer.  ROS:   Please see the history of present illness.     All other systems reviewed and are negative.  EKGs/Labs/Other Studies Reviewed:    The following studies were reviewed today: Cardiac Studies & Procedures       ECHOCARDIOGRAM  ECHOCARDIOGRAM COMPLETE 08/12/2017  Narrative *[facility]* *[facility]* [address] [place], [place] [address] [phone]  ------------------------------------------------------------------- Transthoracic Echocardiography  Patient:    Samuel, Walls MR #:       [id number] Study Date: 08/12/2017 Gender:     M Age:        75 Height:     182.9 cm Weight:     91.9 kg BSA:        2.18 m^2 Pt. Status: Room:       5M11C  ADMITTING    Samuel Walls [id number] VHQIONGE     XBMWUXL, [id number] Essie Hart [id number] PERFORMING   [facility], Inpatient ATTENDING    Samuel Walls SONOGRAPHER  Samuel Walls, RDCS  cc:  ------------------------------------------------------------------- LV EF: 45% -   50%  ------------------------------------------------------------------- Indications:      CVA 436.  ------------------------------------------------------------------- History:   PMH:  Ischemic Cardiomyopathy. Bradycardia.   Coronary artery disease.  Risk factors:  Hypertension. Dyslipidemia.  ------------------------------------------------------------------- Study Conclusions  - Left ventricle: The cavity size was normal. Wall thickness was increased in a pattern of mild LVH. Systolic function was mildly reduced. The estimated ejection fraction was in the range of 45% to 50%. There is akinesis of the apical myocardium. Doppler parameters are consistent with abnormal left ventricular relaxation (grade 1 diastolic dysfunction). - Mitral valve: There was mild regurgitation. - Left atrium: The atrium was moderately dilated. - Right ventricle: The cavity size was mildly dilated. - Atrial septum: There was an atrial septal aneurysm.  Impressions:  - Apical akinesis with overall mildly reduced LV systolic function; mild LVH; mild diastolic dysfunction; mild MR; moderate LAE; mild RVE; atrial  septal aneurysm.  ------------------------------------------------------------------- Study data:  Comparison was made to the study of 11/29/2013.  Study status:  Routine.  Procedure:  Transthoracic echocardiography. Image quality was adequate.  Study completion:  There were no complications.          Transthoracic echocardiography.  M-mode, complete 2D, spectral Doppler, and color Doppler.  Birthdate: Patient birthdate: 1941-07-26.  Age:  Patient is 82 yr old.  Sex: Gender: male.    BMI: 27.5 kg/m^2.  Blood pressure:     156/77 Patient status:  Inpatient.  Study date:  Study date: 08/12/2017. Study time: 02:34 PM.  Location:  Echo laboratory.  -------------------------------------------------------------------  ------------------------------------------------------------------- Left ventricle:  The cavity size was normal. Wall thickness was increased in a pattern of mild LVH. Systolic function was mildly reduced. The estimated ejection fraction was in the range of 45% to 50%.  Regional wall motion abnormalities:    There is akinesis of the apical myocardium. Doppler parameters are consistent with abnormal left ventricular relaxation (grade 1 diastolic dysfunction).  ------------------------------------------------------------------- Aortic valve:   Trileaflet; normal thickness, mildly calcified leaflets. Mobility was not restricted.  Doppler:  Transvalvular velocity was within the normal range. There was no stenosis. There was no regurgitation.  ------------------------------------------------------------------- Aorta:  Aortic root: The aortic root was normal in size.  ------------------------------------------------------------------- Mitral valve:   Mildly thickened leaflets . Mobility was not restricted.  Doppler:  Transvalvular velocity was within the normal range. There was no evidence for stenosis. There was mild regurgitation.    Peak gradient (D): 2 mm Hg.  ------------------------------------------------------------------- Left atrium:  The atrium was moderately dilated.  ------------------------------------------------------------------- Atrial septum:  There was an atrial septal aneurysm.  ------------------------------------------------------------------- Right ventricle:  The cavity size was mildly dilated. Systolic function was normal.  ------------------------------------------------------------------- Pulmonic valve:    Doppler:  Transvalvular velocity was within the normal range. There was no evidence for stenosis. There was trivial regurgitation.  ------------------------------------------------------------------- Tricuspid valve:   Structurally normal valve.    Doppler: Transvalvular velocity was within the normal range. There was mild regurgitation.  ------------------------------------------------------------------- Right atrium:  The atrium was normal in size.  ------------------------------------------------------------------- Pericardium:  There was no  pericardial effusion.  ------------------------------------------------------------------- Systemic veins: Inferior vena cava: The vessel was normal in size.  ------------------------------------------------------------------- Measurements  Left ventricle                           Value        Reference LV ID, ED, PLAX chordal                  45.3  mm     43 - 52 LV ID, ES, PLAX chordal                  31.7  mm     23 - 38 LV fx shortening, PLAX chordal           30    %      >=29 LV PW thickness, ED                      12.1  mm     ---------- IVS/LV PW ratio, ED                      1.14         <=1.3 Stroke volume, 2D  97    ml     ---------- Stroke volume/bsa, 2D                    45    ml/m^2 ---------- LV ejection fraction, 1-p A4C            61    %      ---------- LV end-diastolic volume, 2-p             96    ml     ---------- LV end-systolic volume, 2-p              38    ml     ---------- LV ejection fraction, 2-p                60    %      ---------- Stroke volume, 2-p                       58    ml     ---------- LV end-diastolic volume/bsa, 2-p         44    ml/m^2 ---------- LV end-systolic volume/bsa, 2-p          17    ml/m^2 ---------- Stroke volume/bsa, 2-p                   26.5  ml/m^2 ---------- LV e&', lateral                           9.57  cm/s   ---------- LV E/e&', lateral                         8.12         ---------- LV e&', medial                            7.18  cm/s   ---------- LV E/e&', medial                          10.82        ---------- LV e&', average                           8.38  cm/s   ---------- LV E/e&', average                         9.28         ----------  Ventricular septum                       Value        Reference IVS thickness, ED                        13.8  mm     ----------  LVOT                                     Value        Reference LVOT ID, S  21    mm      ---------- LVOT area                                3.46  cm^2   ---------- LVOT peak velocity, S                    129   cm/s   ---------- LVOT mean velocity, S                    77.9  cm/s   ---------- LVOT VTI, S                              28    cm     ---------- LVOT peak gradient, S                    7     mm Hg  ----------  Aorta                                    Value        Reference Aortic root ID, ED                       35    mm     ----------  Left atrium                              Value        Reference LA ID, A-P, ES                           47    mm     ---------- LA ID/bsa, A-P                           2.16  cm/m^2 <=2.2 LA volume, S                             96.5  ml     ---------- LA volume/bsa, S                         44.4  ml/m^2 ---------- LA volume, ES, 1-p A4C                   71.3  ml     ---------- LA volume/bsa, ES, 1-p A4C               32.8  ml/m^2 ---------- LA volume, ES, 1-p A2C                   129   ml     ---------- LA volume/bsa, ES, 1-p A2C               59.3  ml/m^2 ----------  Mitral valve                             Value        Reference Mitral E-wave  peak velocity              77.7  cm/s   ---------- Mitral A-wave peak velocity              87    cm/s   ---------- Mitral deceleration time         (H)     285   ms     150 - 230 Mitral peak gradient, D                  2     mm Hg  ---------- Mitral E/A ratio, peak                   0.9          ----------  Right atrium                             Value        Reference RA ID, S-I, ES, A4C              (H)     52.6  mm     34 - 49 RA area, ES, A4C                         15.5  cm^2   8.3 - 19.5 RA volume, ES, A/L                       37.3  ml     ---------- RA volume/bsa, ES, A/L                   17.1  ml/m^2 ----------  Systemic veins                           Value        Reference Estimated CVP                            8     mm Hg  ----------  Right ventricle                           Value        Reference TAPSE                                    34    mm     ---------- RV pressure, S, DP               (H)     34    mm Hg  <=30 RV s&', lateral, S                        17.2  cm/s   ----------  Legend: (L)  and  (H)  Rosalin Buster values outside specified reference range.  ------------------------------------------------------------------- Prepared and Electronically Authenticated by  Olga Millers 2018-09-25T16:12:14   TEE  ECHO TEE 08/13/2017  Narrative *[place]* *[facility]* [address] [place], [place] [address] [phone]  ------------------------------------------------------------------- Transesophageal Echocardiography  Patient:    Tatsumi, Dowty MR #:       [id number] Study Date: 08/13/2017  Gender:     M Age:        77 Height:     177.8 cm Weight:     91.8 kg BSA:        2.15 m^2 Pt. Status: Room:       5M11C  ADMITTING    Sam[facility] [id number] ATTENDING    Rai, Ripudeep K PERFORMING   Zoila Shutter MD SONOGRAPHER  Arvil Chaco ORDERING     Jeraldine Loots, Janine Learta [place], [place]  cc:  ------------------------------------------------------------------- LV EF: 45% -   50%  ------------------------------------------------------------------- Indications:      CVA 436.  ------------------------------------------------------------------- History:   PMH:   Coronary artery disease.  Risk factors: Hypertension. Dyslipidemia.  ------------------------------------------------------------------- Study Conclusions  - Left ventricle: There was mild concentric hypertrophy. Systolic function was mildly reduced. The estimated ejection fraction was in the range of 45% to 50%. Distal anteroapical hypokinesis. - Mitral valve: No evidence of vegetation. - Left atrium: The atrium was dilated. No evidence of thrombus in the atrial cavity or appendage. - Right atrium: No evidence of thrombus in the  atrial cavity or appendage. - Atrial septum: Aneurysmal. There is left to right flow by color doppler suggestive of PFO. Right to left flow by saline microbubble contrast was seen intermittently without and with provocation. - Pulmonic valve: No evidence of vegetation.  Impressions:  - PFO is present. Consider referral for PFO closure given recent literature which supports subsequent embolic stroke reduction with this approach.  ------------------------------------------------------------------- Study data:   Study status:  Routine.  Consent:  The risks, benefits, and alternatives to the procedure were explained to the patient and informed consent was obtained.  Procedure:  The patient reported no pain pre or post test. Initial setup. The patient was brought to the laboratory. Surface ECG leads were monitored. Sedation. Deep sedation was administered by cardiology staff. Transesophageal echocardiography. Topical anesthesia was obtained using viscous lidocaine. An adult multiplane transesophageal probe was inserted by the attending cardiologistwith some difficulty. Image quality was adequate. Intravenous contrast (agitated saline) was administered.  Study completion:  The patient tolerated the procedure well. There were no complications.  Administered medications:   Fentanyl, , IV.  Midazolam, 4.5mg , IV. Diagnostic transesophageal echocardiography.  2D and color Doppler. Birthdate:  Patient birthdate: [birth year].  Age:  Patient is 82 yr old.  Sex:  Gender: male.    BMI: 29 kg/m^2.  Blood pressure: 125/78  Patient status:  Inpatient.  Study date:  Study date: 08/13/2017. Study time: 10:42 AM.  Location:  [facility]  -------------------------------------------------------------------  ------------------------------------------------------------------- Left ventricle:  There was mild concentric hypertrophy. Systolic function was mildly reduced. The estimated ejection fraction  was in the range of 45% to 50%. Distal anteroapical hypokinesis.  ------------------------------------------------------------------- Aortic valve:   Trileaflet.  ------------------------------------------------------------------- Aorta:  The aorta was normal, not dilated, and non-diseased.  ------------------------------------------------------------------- Mitral valve:   Structurally normal valve.   Leaflet separation was normal.  No evidence of vegetation.  Doppler:  There was no regurgitation.  ------------------------------------------------------------------- Left atrium:  The atrium was dilated.  No evidence of thrombus in the atrial cavity or appendage.  ------------------------------------------------------------------- Atrial septum:  Aneurysmal. There is left to right flow by color doppler suggestive of PFO. Right to left flow by saline microbubble contrast was seen intermittently without and with provocation.  ------------------------------------------------------------------- Right ventricle:  The cavity size was normal. Wall thickness was normal. Systolic function was normal.  ------------------------------------------------------------------- Pulmonic valve:    Structurally normal valve.   Cusp separation  was normal.  No evidence of vegetation.  ------------------------------------------------------------------- Tricuspid valve:   Doppler:  There was trivial regurgitation.  ------------------------------------------------------------------- Pulmonary artery:   The main pulmonary artery was normal-sized.  ------------------------------------------------------------------- Right atrium:  The atrium was normal in size.  No evidence of thrombus in the atrial cavity or appendage.  ------------------------------------------------------------------- Pericardium:  There was no pericardial  effusion.  ------------------------------------------------------------------- Post procedure conclusions Ascending Aorta:  - The aorta was normal, not dilated, and non-diseased.  ------------------------------------------------------------------- Prepared and Electronically Authenticated by  Zoila Shutter MD 2018-09-26T18:06:55             EKG:    04/16/2023-atrial fibrillation 85 LVH poor R wave progression  Recent Labs: No results found for requested labs within last 365 days.  Recent Lipid Panel    Component Value Date/Time   CHOL 101 08/12/2017 0233   TRIG 90 08/12/2017 0233   HDL 38 (L) 08/12/2017 0233   CHOLHDL 2.7 08/12/2017 0233   VLDL 18 08/12/2017 0233   LDLCALC 45 08/12/2017 0233     Risk Assessment/Calculations:               Physical Exam:    VS:  BP 112/78   Pulse 85   Ht 5\' 11"  (1.803 m)   Wt 189 lb (85.7 kg)   SpO2 98%   BMI 26.36 kg/m     Wt Readings from Last 3 Encounters:  04/16/23 189 lb (85.7 kg)  03/11/22 184 lb (83.5 kg)  02/06/21 193 lb 9.6 oz (87.8 kg)     GEN:  Well nourished, well developed in no acute distress HEENT: Normal NECK: No JVD; No carotid bruits LYMPHATICS: No lymphadenopathy CARDIAC: Irreg, no murmurs, rubs, gallops RESPIRATORY:  Clear to auscultation without rales, wheezing or rhonchi  ABDOMEN: Soft, non-tender, non-distended MUSCULOSKELETAL:  No edema; No deformity  SKIN: Warm and dry NEUROLOGIC:  Alert and oriented x 3 PSYCHIATRIC:  Normal affect   ASSESSMENT:    1. Paroxysmal atrial fibrillation (HCC)   2. Coronary artery disease due to lipid rich plaque   3. PFO (patent foramen ovale)    PLAN:    In order of problems listed above:   Persistent atrial fibrillation (HCC) He has been in atrial fibrillation since his last appointment.  Currently heart rate 85 bpm..  Stable without any symptoms.  Did not realize he was in it.  He is not having any shortness of breath.  He is very active.  Lots of  chores.  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  Subsequently this was removed in 2022 by Dr. Ladona [facility].  He is on Xarelto, dose adjusted for GFR/creatinine clearance.  Prior hemoglobin reviewed from outside labs 14.9, creatinine 1.5  Knows the importance of Xarelto.  Cost can be an issue at times.  I did mention watchman to him.  Not interested.   CAD (coronary artery disease) 2013 MI, proximal LAD was 95%.  Had a Xience DES placed to that region.  His EF was 35% at that time.  He also has moderate disease elsewhere.  Continue with goal-directed medical therapy.  High intensity statin, ACE inhibitor.  He is not on beta-blocker because of chronic bradycardia.  He is currently on Xarelto, not on aspirin.  Doing very well no anginal symptoms   PFO (patent foramen ovale) Picked up from prior TEE.  Currently on anticoagulation.  Doing well.   Hyperlipidemia with target low density lipoprotein (LDL) cholesterol less  than 70 mg/dL Currently on atorvastatin 40 mg.  LDL goal less than 70.  Actually with his stroke, prior MI could consider less than 55 target.  No myalgias.  LDL 57   History of stroke Occurred in 2018.  Felt to be cryptogenic at first.  Loop recorder placed.  Short episode of atrial fibrillation noted.  He was placed on Xarelto.  PFO noted on TEE 2018.  Now, appears to be in permanent atrial fibrillation.          Medication Adjustments/Labs and Tests Ordered: Current medicines are reviewed at length with the patient today.  Concerns regarding medicines are outlined above.  Orders Placed This Encounter  Procedures   EKG 12-Lead   No orders of the defined types were placed in this encounter.   Patient Instructions  Medication Instructions:  The current medical regimen is effective;  continue present plan and medications.  *If you need a refill on your cardiac medications before your next appointment, please call  your pharmacy*   Follow-Up: At [facility], you and your health needs are our priority.  As part of our continuing mission to provide you with exceptional heart care, we have created designated Provider Care Teams.  These Care Teams include your primary Cardiologist (physician) and [facility] (APPs -  Physician Assistants and Nurse Practitioners) who all work together to provide you with the care you need, when you need it.  We recommend signing up for the patient portal called "MyChart".  Sign up information is provided on this After Visit Summary.  MyChart is used to connect with patients for Virtual Visits (Telemedicine).  Patients are able to view [facility]/test results, encounter notes, upcoming appointments, etc.  Non-urgent messages can be sent to your provider as well.   To learn more about what you can do with MyChart, go to [email or web address].    Your next appointment:   1 year(s)  Provider:   Donato Schultz, MD       Signed, Samuel Schultz, MD  04/16/2023 2:43 PM    [facility]

## 2023-04-16 NOTE — Patient Instructions (Signed)
Medication Instructions:  The current medical regimen is effective;  continue present plan and medications.  *If you need a refill on your cardiac medications before your next appointment, please call your pharmacy*  Follow-Up: At [facility], you and your health needs are our priority.  As part of our continuing mission to provide you with exceptional heart care, we have created designated Provider Care Teams.  These Care Teams include your primary Cardiologist (physician) and Advanced Practice Providers (APPs -  Physician Assistants and Nurse Practitioners) who all work together to provide you with the care you need, when you need it.  We recommend signing up for the patient portal called "MyChart".  Sign up information is provided on this After Visit Summary.  MyChart is used to connect with patients for Virtual Visits (Telemedicine).  Patients are able to view lab/test results, encounter notes, upcoming appointments, etc.  Non-urgent messages can be sent to your provider as well.   To learn more about what you can do with MyChart, go to [email or web address].    Your next appointment:   1 year(s)  Provider:   Mark Skains, MD      

## 2023-05-19 ENCOUNTER — Ambulatory Visit: Payer: PPO | Admitting: Cardiology

## 2023-05-24 ENCOUNTER — Other Ambulatory Visit: Payer: Self-pay | Admitting: Cardiology

## 2023-07-01 ENCOUNTER — Other Ambulatory Visit: Payer: Self-pay | Admitting: Cardiology

## 2023-07-11 DIAGNOSIS — L299 Pruritus, unspecified: Secondary | ICD-10-CM | POA: Diagnosis not present

## 2023-07-11 DIAGNOSIS — L300 Nummular dermatitis: Secondary | ICD-10-CM | POA: Diagnosis not present

## 2023-07-28 DIAGNOSIS — E785 Hyperlipidemia, unspecified: Secondary | ICD-10-CM | POA: Diagnosis not present

## 2023-07-28 DIAGNOSIS — I2510 Atherosclerotic heart disease of native coronary artery without angina pectoris: Secondary | ICD-10-CM | POA: Diagnosis not present

## 2023-07-28 DIAGNOSIS — I480 Paroxysmal atrial fibrillation: Secondary | ICD-10-CM | POA: Diagnosis not present

## 2023-07-28 DIAGNOSIS — R7301 Impaired fasting glucose: Secondary | ICD-10-CM | POA: Diagnosis not present

## 2023-07-28 DIAGNOSIS — N183 Chronic kidney disease, stage 3 unspecified: Secondary | ICD-10-CM | POA: Diagnosis not present

## 2023-07-28 DIAGNOSIS — Z125 Encounter for screening for malignant neoplasm of prostate: Secondary | ICD-10-CM | POA: Diagnosis not present

## 2023-07-28 DIAGNOSIS — Z8546 Personal history of malignant neoplasm of prostate: Secondary | ICD-10-CM | POA: Diagnosis not present

## 2023-07-28 DIAGNOSIS — Z9861 Coronary angioplasty status: Secondary | ICD-10-CM | POA: Diagnosis not present

## 2023-07-28 DIAGNOSIS — I129 Hypertensive chronic kidney disease with stage 1 through stage 4 chronic kidney disease, or unspecified chronic kidney disease: Secondary | ICD-10-CM | POA: Diagnosis not present

## 2023-07-28 DIAGNOSIS — R001 Bradycardia, unspecified: Secondary | ICD-10-CM | POA: Diagnosis not present

## 2023-08-26 ENCOUNTER — Other Ambulatory Visit: Payer: Self-pay | Admitting: *Deleted

## 2023-08-26 DIAGNOSIS — I480 Paroxysmal atrial fibrillation: Secondary | ICD-10-CM

## 2023-08-26 NOTE — Telephone Encounter (Signed)
Called pt to notify of the xarelto dose change; there was no answer and voicemail is full. Will try back later before sending rx

## 2023-08-26 NOTE — Telephone Encounter (Signed)
Yes please increase back to 20mg  once daily

## 2023-08-26 NOTE — Telephone Encounter (Signed)
Xarelto 15mg  refill request received. Pt is 82 years old, weight-85.7kg, Crea-1.28 on 07/28/23 via [employer] from [facility], last seen by Dr. Anne Fu on 04/16/23, Diagnosis-Afib, CrCl-53.93 mL/min; Dose is in appropriate based on dosing criteria.  Dose was decreased on 04/07/23 per refill note due Scr <50. Will send to Pharm D to evaluate doseage.

## 2023-08-27 MED ORDER — RIVAROXABAN 20 MG PO TABS
20.0000 mg | ORAL_TABLET | Freq: Every day | ORAL | 1 refills | Status: DC
Start: 2023-08-27 — End: 2024-02-17

## 2023-08-27 NOTE — Telephone Encounter (Signed)
Spoke with pt and advised that the xarelto dose was changing again. Advised he will now be taking xarelto 20mg  daily. Also, he would like a 90 day supply as well. Advised will

## 2023-09-23 ENCOUNTER — Other Ambulatory Visit: Payer: Self-pay | Admitting: Cardiology

## 2023-10-01 ENCOUNTER — Encounter: Payer: Self-pay | Admitting: Cardiology

## 2023-10-01 ENCOUNTER — Ambulatory Visit: Payer: PPO | Attending: Cardiology | Admitting: Cardiology

## 2023-10-01 VITALS — BP 146/96 | HR 58 | Ht 71.0 in | Wt 189.2 lb

## 2023-10-01 DIAGNOSIS — E785 Hyperlipidemia, unspecified: Secondary | ICD-10-CM | POA: Diagnosis not present

## 2023-10-01 DIAGNOSIS — I2583 Coronary atherosclerosis due to lipid rich plaque: Secondary | ICD-10-CM

## 2023-10-01 DIAGNOSIS — I480 Paroxysmal atrial fibrillation: Secondary | ICD-10-CM

## 2023-10-01 DIAGNOSIS — I2510 Atherosclerotic heart disease of native coronary artery without angina pectoris: Secondary | ICD-10-CM

## 2023-10-01 DIAGNOSIS — Z8673 Personal history of transient ischemic attack (TIA), and cerebral infarction without residual deficits: Secondary | ICD-10-CM | POA: Diagnosis not present

## 2023-10-01 DIAGNOSIS — Q2112 Patent foramen ovale: Secondary | ICD-10-CM

## 2023-10-01 NOTE — Progress Notes (Signed)
Cardiology Office Note:  .   Date:  10/01/2023  ID:  Samuel Walls, DOB 07-24-1941, MRN [id number] PCP: Paulina Fusi, MD  [facility] Providers Cardiologist:  Donato Schultz, MD Electrophysiologist:  Lewayne Bunting, MD     History of Present Illness: .   Samuel Walls is a 82 y.o. male Discussed with the use of AI scribe  History of Present Illness   The patient is an 82 year old male with a history of persistent atrial fibrillation, coronary artery disease, and ischemic cardiomyopathy. The atrial fibrillation was initially detected in 2018 via an implantable loop recorder following a cryptogenic stroke. The patient has been asymptomatic with no reported shortness of breath and maintains an active lifestyle.  The patient's atrial fibrillation is managed with Xarelto, which has been dose-adjusted due to creatinine clearance. The patient has reported occasional cost issues with this medication. The patient also has a history of coronary artery disease, for which a drug-eluting stent was placed in the proximal LA in 2013 following a myocardial infarction. At that time, the patient's ejection fraction (EF) was 35%, indicative of ischemic cardiomyopathy. The patient is currently on a high-intensity statin and an ACE inhibitor, but not a beta blocker due to chronic bradycardia.  The patient also has a patent foramen ovale (PFO) that was detected on a prior transesophageal echocardiogram (TEE). The patient's cholesterol is well-managed with atorvastatin 40 mg, with a recent LDL of 57. The patient reports no myalgias.  The patient's most recent echocardiogram in 2018 showed an EF of 45-50% with apical akinesis, mild mitral regurgitation, and a moderately dilated left atrium. The patient reports no chest pain, shortness of breath, or sensations associated with atrial fibrillation.  The patient maintains an active lifestyle, including outdoor chores and mowing. The patient's hypertension is  managed with amlodipine 2.5 mg and lisinopril 20 mg. The patient also takes Lovaza, a purified fish oil supplement.               Risk Assessment/Calculations:           Physical Exam:   VS:  BP (!) 146/96 (BP Location: Left Arm, Patient Position: Sitting, Cuff Size: Large)   Pulse (!) 58   Ht 5\' 11"  (1.803 m)   Wt 189 lb 3.2 oz (85.8 kg)   SpO2 96%   BMI 26.39 kg/m    Wt Readings from Last 3 Encounters:  10/01/23 189 lb 3.2 oz (85.8 kg)  04/16/23 189 lb (85.7 kg)  03/11/22 184 lb (83.5 kg)    GEN: Well nourished, well developed in no acute distress NECK: No JVD; No carotid bruits CARDIAC: IRRR, no murmurs, no rubs, no gallops RESPIRATORY:  Clear to auscultation without rales, wheezing or rhonchi  ABDOMEN: Soft, non-tender, non-distended EXTREMITIES:  No edema; No deformity   ASSESSMENT AND PLAN: .    Assessment and Plan    Persistent Atrial Fibrillation Persistent atrial fibrillation, originally detected in 2018 via an implantable loop recorder after a cryptogenic stroke. Currently asymptomatic with no shortness of breath or chest pain. On Xarelto 20 mg for anticoagulation due to improved creatinine clearance. Discussed potential Watchman device previously. - Continue Xarelto 20 mg daily - Order echocardiogram to reassess heart function  Coronary Artery Disease with Ischemic Cardiomyopathy Coronary artery disease with a drug-eluting stent placed in the proximal LAD in 2013 following a myocardial infarction. Ejection fraction was 35% at that time, indicating ischemic cardiomyopathy. On high-intensity statin and an ACE inhibitor. No beta blocker due to  chronic bradycardia. Most recent echocardiogram in 2018 showed EF of 45-50% with apical akinesis, mild mitral regurgitation, and moderately dilated left atrium. No chest pain or shortness of breath reported. - Continue atorvastatin 40 mg daily - Continue ACE inhibitor - Order echocardiogram to reassess heart  function  Cryptogenic Stroke Cryptogenic stroke in 2018, after which atrial fibrillation was detected and placed on Xarelto. - Continue Xarelto 20 mg daily  Hypertension Hypertension, managed with amlodipine 2.5 mg and lisinopril 20 mg. Blood pressure slightly elevated during the visit. - Continue amlodipine 2.5 mg daily - Continue lisinopril 20 mg daily  Hyperlipidemia Hyperlipidemia, well-controlled with atorvastatin 40 mg. LDL is 57, and no myalgias reported. Also taking Lovaza (purified fish oil). - Continue atorvastatin 40 mg daily - Continue Lovaza  Follow-up - Order echocardiogram - Schedule follow-up appointment in one year.              Signed, Donato Schultz, MD

## 2023-10-01 NOTE — Patient Instructions (Addendum)
Medication Instructions:  Your physician recommends that you continue on your current medications as directed. Please refer to the Current Medication list given to you today.  *If you need a refill on your cardiac medications before your next appointment, please call your pharmacy*   Lab Work: none If you have labs (blood work) drawn today and your tests are completely normal, you will receive your results only by: MyChart Message (if you have MyChart) OR A paper copy in the mail If you have any lab test that is abnormal or we need to change your treatment, we will call you to review the results.   Testing/Procedures: Your physician has requested that you have an echocardiogram. Echocardiography is a painless test that uses sound waves to create images of your heart. It provides your doctor with information about the size and shape of your heart and how well your heart's chambers and valves are working. This procedure takes approximately one hour. There are no restrictions for this procedure. Please do NOT wear cologne, perfume, aftershave, or lotions (deodorant is allowed). Please arrive 15 minutes prior to your appointment time.  Please note: We ask at that you not bring children with you during ultrasound (echo/ vascular) testing. Due to room size and safety concerns, children are not allowed in the ultrasound rooms during exams. Our front office staff cannot provide observation of children in our lobby area while testing is being conducted. An adult accompanying a patient to their appointment will only be allowed in the ultrasound room at the discretion of the ultrasound technician under special circumstances. We apologize for any inconvenience.    Follow-Up: At [facility], you and your health needs are our priority.  As part of our continuing mission to provide you with exceptional heart care, we have created designated Provider Care Teams.  These Care Teams include your  primary Cardiologist (physician) and Advanced Practice Providers (APPs -  Physician Assistants and Nurse Practitioners) who all work together to provide you with the care you need, when you need it.  We recommend signing up for the patient portal called "MyChart".  Sign up information is provided on this After Visit Summary.  MyChart is used to connect with patients for Virtual Visits (Telemedicine).  Patients are able to view lab/test results, encounter notes, upcoming appointments, etc.  Non-urgent messages can be sent to your provider as well.   To learn more about what you can do with MyChart, go to [email or web address].    Your next appointment:   12 month(s)  Provider:   Donato Schultz, MD     Other Instructions

## 2023-11-06 ENCOUNTER — Ambulatory Visit (HOSPITAL_COMMUNITY): Payer: PPO | Attending: Cardiology

## 2023-11-06 DIAGNOSIS — I2583 Coronary atherosclerosis due to lipid rich plaque: Secondary | ICD-10-CM | POA: Diagnosis not present

## 2023-11-06 DIAGNOSIS — I2510 Atherosclerotic heart disease of native coronary artery without angina pectoris: Secondary | ICD-10-CM | POA: Insufficient documentation

## 2023-11-06 DIAGNOSIS — I480 Paroxysmal atrial fibrillation: Secondary | ICD-10-CM | POA: Insufficient documentation

## 2023-11-06 LAB — ECHOCARDIOGRAM COMPLETE
Est EF: 35
S' Lateral: 3.8 cm

## 2023-11-06 MED ORDER — PERFLUTREN LIPID MICROSPHERE
1.0000 mL | INTRAVENOUS | Status: AC | PRN
Start: 2023-11-06 — End: 2023-11-06
  Administered 2023-11-06: 2 mL via INTRAVENOUS

## 2024-02-17 ENCOUNTER — Other Ambulatory Visit: Payer: Self-pay

## 2024-02-17 DIAGNOSIS — I480 Paroxysmal atrial fibrillation: Secondary | ICD-10-CM

## 2024-02-17 MED ORDER — RIVAROXABAN 20 MG PO TABS: 20.0000 mg | ORAL_TABLET | Freq: Every day | ORAL | 1 refills | Status: AC

## 2024-02-17 NOTE — Telephone Encounter (Signed)
 Prescription refill request for Xarelto received.  Indication:afb Last office visit:11/24 Weight:85.8  kg Age:83 Scr:1.37  3/25 CrCl:50.45  ml/min  Prescription refilled

## 2024-03-31 ENCOUNTER — Other Ambulatory Visit: Payer: Self-pay | Admitting: Cardiology

## 2024-04-29 DIAGNOSIS — N183 Chronic kidney disease, stage 3 unspecified: Secondary | ICD-10-CM | POA: Diagnosis not present

## 2024-04-29 DIAGNOSIS — E785 Hyperlipidemia, unspecified: Secondary | ICD-10-CM | POA: Diagnosis not present

## 2024-04-29 DIAGNOSIS — Z8546 Personal history of malignant neoplasm of prostate: Secondary | ICD-10-CM | POA: Diagnosis not present

## 2024-04-29 DIAGNOSIS — R7301 Impaired fasting glucose: Secondary | ICD-10-CM | POA: Diagnosis not present

## 2024-05-11 ENCOUNTER — Other Ambulatory Visit: Payer: Self-pay | Admitting: Cardiology

## 2024-08-13 DIAGNOSIS — H40033 Anatomical narrow angle, bilateral: Secondary | ICD-10-CM | POA: Diagnosis not present

## 2024-08-13 DIAGNOSIS — H524 Presbyopia: Secondary | ICD-10-CM | POA: Diagnosis not present

## 2024-08-13 DIAGNOSIS — H43393 Other vitreous opacities, bilateral: Secondary | ICD-10-CM | POA: Diagnosis not present

## 2024-08-13 DIAGNOSIS — H2513 Age-related nuclear cataract, bilateral: Secondary | ICD-10-CM | POA: Diagnosis not present

## 2024-08-13 DIAGNOSIS — H402231 Chronic angle-closure glaucoma, bilateral, mild stage: Secondary | ICD-10-CM | POA: Diagnosis not present

## 2024-10-28 DIAGNOSIS — N183 Chronic kidney disease, stage 3 unspecified: Secondary | ICD-10-CM | POA: Diagnosis not present

## 2024-10-28 DIAGNOSIS — R7301 Impaired fasting glucose: Secondary | ICD-10-CM | POA: Diagnosis not present

## 2024-10-28 DIAGNOSIS — E785 Hyperlipidemia, unspecified: Secondary | ICD-10-CM | POA: Diagnosis not present

## 2024-10-28 DIAGNOSIS — Z125 Encounter for screening for malignant neoplasm of prostate: Secondary | ICD-10-CM | POA: Diagnosis not present

## 2024-11-06 ENCOUNTER — Other Ambulatory Visit: Payer: Self-pay | Admitting: Cardiology

## 2024-12-08 ENCOUNTER — Other Ambulatory Visit: Payer: Self-pay | Admitting: Cardiology

## 2024-12-14 NOTE — Telephone Encounter (Signed)
" °*  STAT* If patient is at the pharmacy, call can be transferred to refill team.   1. Which medications need to be refilled? (please list name of each medication and dose if known) atorvastatin  (LIPITOR) 40 MG tablet    2. Would you like to learn more about the convenience, safety, & potential cost savings by using the [facility]? No    3. Are you open to using the [facility] (Type [facility]. ). No   4. Which pharmacy/location (including street and city if local pharmacy) is medication to be sent to?[id number] - [place], [place] - [address]    5. Do they need a 30 day or 90 day supply? 90  Pt has upcoming appt 3/13  "

## 2025-01-28 ENCOUNTER — Ambulatory Visit: Admitting: Cardiology
